# Patient Record
Sex: Female | Born: 1990
Health system: Southern US, Community
[De-identification: ages and names within clinical notes are randomized; demographics above are authoritative.]

## PROBLEM LIST (undated history)

## (undated) ENCOUNTER — Inpatient Hospital Stay (HOSPITAL_COMMUNITY): Payer: Self-pay

## (undated) DIAGNOSIS — D219 Benign neoplasm of connective and other soft tissue, unspecified: Secondary | ICD-10-CM

## (undated) DIAGNOSIS — T7840XA Allergy, unspecified, initial encounter: Secondary | ICD-10-CM

## (undated) DIAGNOSIS — R87629 Unspecified abnormal cytological findings in specimens from vagina: Secondary | ICD-10-CM

## (undated) DIAGNOSIS — I10 Essential (primary) hypertension: Secondary | ICD-10-CM

## (undated) DIAGNOSIS — Z789 Other specified health status: Secondary | ICD-10-CM

## (undated) HISTORY — DX: Unspecified abnormal cytological findings in specimens from vagina: R87.629

## (undated) HISTORY — DX: Other specified health status: Z78.9

## (undated) HISTORY — DX: Essential (primary) hypertension: I10

## (undated) HISTORY — PX: OTHER SURGICAL HISTORY: SHX169

## (undated) HISTORY — PX: NO PAST SURGERIES: SHX2092

## (undated) HISTORY — DX: Allergy, unspecified, initial encounter: T78.40XA

---
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
## 2009-11-13 ENCOUNTER — Emergency Department (HOSPITAL_COMMUNITY): Admission: EM | Admit: 2009-11-13 | Discharge: 2009-11-13 | Payer: Self-pay | Admitting: Emergency Medicine

## 2014-01-10 ENCOUNTER — Encounter: Payer: Self-pay | Admitting: *Deleted

## 2014-02-08 ENCOUNTER — Ambulatory Visit (INDEPENDENT_AMBULATORY_CARE_PROVIDER_SITE_OTHER): Payer: Managed Care, Other (non HMO) | Admitting: Family Medicine

## 2014-02-08 ENCOUNTER — Encounter: Payer: Self-pay | Admitting: Family Medicine

## 2014-02-08 VITALS — BP 124/62 | HR 78 | Temp 98.6°F | Resp 14 | Ht 62.0 in | Wt 151.0 lb

## 2014-02-08 DIAGNOSIS — Z124 Encounter for screening for malignant neoplasm of cervix: Secondary | ICD-10-CM

## 2014-02-08 DIAGNOSIS — Z0000 Encounter for general adult medical examination without abnormal findings: Secondary | ICD-10-CM

## 2014-02-08 DIAGNOSIS — Z720 Tobacco use: Secondary | ICD-10-CM

## 2014-02-08 DIAGNOSIS — Z3040 Encounter for surveillance of contraceptives, unspecified: Secondary | ICD-10-CM

## 2014-02-08 DIAGNOSIS — F17200 Nicotine dependence, unspecified, uncomplicated: Secondary | ICD-10-CM

## 2014-02-08 DIAGNOSIS — Z113 Encounter for screening for infections with a predominantly sexual mode of transmission: Secondary | ICD-10-CM

## 2014-02-08 DIAGNOSIS — Z23 Encounter for immunization: Secondary | ICD-10-CM

## 2014-02-08 LAB — CBC WITH DIFFERENTIAL/PLATELET
BASOS ABS: 0 10*3/uL (ref 0.0–0.1)
BASOS PCT: 0 % (ref 0–1)
Eosinophils Absolute: 0.1 10*3/uL (ref 0.0–0.7)
Eosinophils Relative: 1 % (ref 0–5)
HCT: 42.8 % (ref 36.0–46.0)
Hemoglobin: 13.9 g/dL (ref 12.0–15.0)
Lymphocytes Relative: 25 % (ref 12–46)
Lymphs Abs: 1.9 10*3/uL (ref 0.7–4.0)
MCH: 28.1 pg (ref 26.0–34.0)
MCHC: 32.5 g/dL (ref 30.0–36.0)
MCV: 86.6 fL (ref 78.0–100.0)
MPV: 10.2 fL (ref 8.6–12.4)
Monocytes Absolute: 0.8 10*3/uL (ref 0.1–1.0)
Monocytes Relative: 10 % (ref 3–12)
NEUTROS ABS: 4.9 10*3/uL (ref 1.7–7.7)
NEUTROS PCT: 64 % (ref 43–77)
PLATELETS: 334 10*3/uL (ref 150–400)
RBC: 4.94 MIL/uL (ref 3.87–5.11)
RDW: 13.9 % (ref 11.5–15.5)
WBC: 7.7 10*3/uL (ref 4.0–10.5)

## 2014-02-08 LAB — WET PREP FOR TRICH, YEAST, CLUE
CLUE CELLS WET PREP: NONE SEEN
Yeast Wet Prep HPF POC: NONE SEEN

## 2014-02-08 LAB — PREGNANCY, URINE: PREG TEST UR: NEGATIVE

## 2014-02-08 MED ORDER — INFLUENZA VAC SPLIT QUAD 0.5 ML IM SUSP: 0.5000 mL | Freq: Once | INTRAMUSCULAR | Status: DC

## 2014-02-08 MED ORDER — NORELGESTROMIN-ETH ESTRADIOL 150-35 MCG/24HR TD PTWK: 1.0000 | MEDICATED_PATCH | TRANSDERMAL | Status: DC

## 2014-02-08 NOTE — Assessment & Plan Note (Signed)
Discussed medications as well as smoking patches at this time she declines intervention

## 2014-02-08 NOTE — Addendum Note (Signed)
Addended by: Sheral Flow on: 02/08/2014 04:05 PM   Modules accepted: Orders

## 2014-02-08 NOTE — Progress Notes (Signed)
Patient ID: Allison Prince, female   DOB: 1990/10/01, 24 y.o.   MRN: [id number]   Subjective:    Patient ID: Allison Prince, female    DOB: 1990-09-02, 24 y.o.   MRN: [id number]  Patient presents for CPE with PAP  patient here to establish care. Her mother is also my patient Allison Prince. She has not had a primary care provider she went to the health department couple years ago states that she was treated for Trichomonas at that time. She is to check sexually active with  one partner. She does not have any children. She has no known medical problems. She does request STD screening today and should also like to start birth control. She's been on the pills in the past but wants to try topping different. She also smokes about a pack a day and is thinking about quitting  Works as Tax adviser:  GEN- denies fatigue, fever, weight loss,weakness, recent illness HEENT- denies eye drainage, change in vision, nasal discharge, CVS- denies chest pain, palpitations RESP- denies SOB, cough, wheeze ABD- denies N/V, change in stools, abd pain GU- denies dysuria, hematuria, dribbling, incontinence MSK- denies joint pain, muscle aches, injury Neuro- denies headache, dizziness, syncope, seizure activity       Objective:    BP 124/62 mmHg  Pulse 78  Temp(Src) 98.6 F (37 C) (Oral)  Resp 14  Ht 5\' 2"  (1.575 m)  Wt 151 lb (68.493 kg)  BMI 27.61 kg/m2  LMP 01/20/2014 (Exact Date) GEN- NAD, alert and oriented x3 HEENT- PERRL, EOMI, non injected sclera, pink conjunctiva, MMM, oropharynx clear, TM clear bilat, wears glasses Neck- Supple, no thyromegaly Breast- normal symmetry, no nipple inversion,no nipple drainage, no nodules or lumps felt Nodes- no axillary nodes CVS- RRR, no murmur RESP-CTAB ABD-NABS,soft,NT,ND GU- normal external genitalia, vaginal mucosa pink and moist, cervix visualized no growth, no blood form os, minimal thin clear discharge, no CMT, no ovarian masses,  uterus normal size EXT- No edema Pulses- Radial, DP- 2+        Assessment & Plan:      Problem List Items Addressed This Visit      Unprioritized   Tobacco use disorder    Other Visit Diagnoses    Cervical cancer screening    -  Primary    Relevant Orders    PAP, ThinPrep ASCUS Rflx HPV Rflx Type    Screen for STD (sexually transmitted disease)        Relevant Orders    WET PREP FOR [place], YEAST, CLUE (Completed)    GC/Chlamydia Probe Amp    HIV antibody    RPR    Routine general medical examination at a health care facility        CPE, PAP, flu shot, TDAP UTD, start ortho evra patch, U preg neg, fastingl labs due to family history    Relevant Orders    CBC with Differential    Comprehensive metabolic panel    Lipid panel    Encounter for surveillance of contraceptives        Relevant Orders    Pregnancy, urine (Completed)       Note: This dictation was prepared with Dragon dictation along with smaller phrase technology. Any transcriptional errors that result from this process are unintentional.

## 2014-02-08 NOTE — Patient Instructions (Signed)
I recommend eye visit once a year I recommend dental visit every 6 months Goal is to  Exercise 30 minutes 5 days a week We will send a letter with lab results  Flu shot given Start the birth control Patch - start first day of your menstrual cycle Call if you want to try something for smokng F/U as needed

## 2014-02-09 LAB — COMPREHENSIVE METABOLIC PANEL
ALBUMIN: 4.3 g/dL (ref 3.5–5.2)
ALT: 11 U/L (ref 0–35)
AST: 13 U/L (ref 0–37)
Alkaline Phosphatase: 36 U/L — ABNORMAL LOW (ref 39–117)
BUN: 9 mg/dL (ref 6–23)
CALCIUM: 9.3 mg/dL (ref 8.4–10.5)
CHLORIDE: 105 meq/L (ref 96–112)
CO2: 25 meq/L (ref 19–32)
CREATININE: 0.68 mg/dL (ref 0.50–1.10)
Glucose, Bld: 85 mg/dL (ref 70–99)
POTASSIUM: 4.1 meq/L (ref 3.5–5.3)
Sodium: 139 mEq/L (ref 135–145)
Total Bilirubin: 0.6 mg/dL (ref 0.2–1.2)
Total Protein: 7.1 g/dL (ref 6.0–8.3)

## 2014-02-09 LAB — GC/CHLAMYDIA PROBE AMP
CT Probe RNA: NEGATIVE
GC Probe RNA: NEGATIVE

## 2014-02-09 LAB — LIPID PANEL
CHOL/HDL RATIO: 3.1 ratio
Cholesterol: 151 mg/dL (ref 0–200)
HDL: 48 mg/dL (ref >39)
LDL Cholesterol: 87 mg/dL (ref 0–99)
Triglycerides: 82 mg/dL (ref <150)
VLDL: 16 mg/dL (ref 0–40)

## 2014-02-09 LAB — HIV ANTIBODY (ROUTINE TESTING W REFLEX): HIV: NONREACTIVE

## 2014-02-09 LAB — RPR

## 2014-02-10 ENCOUNTER — Other Ambulatory Visit: Payer: Self-pay | Admitting: *Deleted

## 2014-02-10 LAB — PAP THINPREP ASCUS RFLX HPV RFLX TYPE

## 2014-02-10 MED ORDER — METRONIDAZOLE 500 MG PO TABS: 500.0000 mg | ORAL_TABLET | Freq: Two times a day (BID) | ORAL | Status: DC

## 2014-08-16 ENCOUNTER — Encounter: Payer: Self-pay | Admitting: Family Medicine

## 2014-08-16 ENCOUNTER — Ambulatory Visit (INDEPENDENT_AMBULATORY_CARE_PROVIDER_SITE_OTHER): Payer: Managed Care, Other (non HMO) | Admitting: Family Medicine

## 2014-08-16 VITALS — BP 128/80 | HR 76 | Temp 98.6°F | Resp 18 | Ht 62.0 in | Wt 144.0 lb

## 2014-08-16 DIAGNOSIS — J029 Acute pharyngitis, unspecified: Secondary | ICD-10-CM | POA: Diagnosis not present

## 2014-08-16 LAB — RAPID STREP SCREEN (MED CTR MEBANE ONLY): STREPTOCOCCUS, GROUP A SCREEN (DIRECT): NEGATIVE

## 2014-08-16 MED ORDER — AMOXICILLIN 500 MG PO CAPS: 500.0000 mg | ORAL_CAPSULE | Freq: Three times a day (TID) | ORAL | Status: DC

## 2014-08-16 NOTE — Progress Notes (Signed)
Patient ID: Allison Prince, female   DOB: 1991/01/15, 24 y.o.   MRN: [id number]   Subjective:    Patient ID: Allison Prince, female    DOB: 1990-08-09, 24 y.o.   MRN: [id number]  Patient presents for Illness  HERE WITH SORE THROAT THAT STARTED 24 HOURS AGO. sHE FEELS LIKE IT IS A BURNING SCRATCHY SENSATION IN THE BACK OF HER THROAT AND VERY SEVERE WHEN SHE TRIES TO e. pOSITIVE SICK CONTACT WITH HER COUSIN WHO HAS STREP THROAT. SHE HAS NOT HAD ANY FEVER BUT SHE DID TAKE tYLENOL THIS MORNING. sHE'S NOT HAD ANY COUGH OR CONGESTION    Review Of Systems: PER ABOVE   GEN- denies fatigue, fever, weight loss,weakness, recent illness HEENT- denies eye drainage, change in vision, nasal discharge, CVS- denies chest pain, palpitations RESP- denies SOB, cough, wheeze ABD- denies N/V, change in stools, abd pain Neuro- denies headache, dizziness, syncope, seizure activity       Objective:    BP 128/80 mmHg  Pulse 76  Temp(Src) 98.6 F (37 C) (Oral)  Resp 18  Ht 5\' 2"  (1.575 m)  Wt 144 lb (65.318 kg)  BMI 26.33 kg/m2 GEN- NAD, alert and oriented x3 HEENT- PERRL, EOMI, non injected sclera, pink conjunctiva, MMM, oropharynx inected, blisters noted on uvula and right tonsil, enlarged tonsils  Neck- Supple, no + LAD R>L CVS- RRR, no murmur RESP-CTAB         Assessment & Plan:      Problem List Items Addressed This Visit    None    Visit Diagnoses    Acute pharyngitis, unspecified pharyngitis type    -  Primary    Early in illness, but meets criteria for strep, treat with amox, ibuprofen and salt water gargles    Relevant Orders    Rapid Strep Screen (Completed)       Note: This dictation was prepared with Dragon dictation along with smaller phrase technology. Any transcriptional errors that result from this process are unintentional.

## 2014-08-16 NOTE — Patient Instructions (Signed)
Take antibiotics as prescribed Use salt water gargling Ibuprofen as needed  F/U as needed

## 2015-02-17 ENCOUNTER — Other Ambulatory Visit: Payer: Self-pay | Admitting: Family Medicine

## 2015-02-19 NOTE — Telephone Encounter (Signed)
Refill appropriate and filled per protocol. 

## 2015-04-20 ENCOUNTER — Telehealth: Payer: Self-pay | Admitting: Family Medicine

## 2015-04-20 MED ORDER — NORELGESTROMIN-ETH ESTRADIOL 150-35 MCG/24HR TD PTWK: MEDICATED_PATCH | TRANSDERMAL | Status: DC

## 2015-04-20 NOTE — Telephone Encounter (Signed)
Patient aware.

## 2015-04-20 NOTE — Telephone Encounter (Signed)
Patient calling requesting refill called into [facility] in [place] she also would like to know when she is do for another pap- she is about to change jobs and would like to have this done if she needs it.    XULANE 150-35 MCG/24HR transdermal patch  CB# [phone]

## 2015-04-20 NOTE — Telephone Encounter (Signed)
Patient las PAP noted in 02/27/2015. MD recommends PAP to be repeated in 2 years.   Prescription sent to pharmacy.   Call placed to patient. [place].

## 2015-08-01 ENCOUNTER — Telehealth: Payer: Self-pay | Admitting: Family Medicine

## 2015-08-01 NOTE — Telephone Encounter (Signed)
Patient calling to get her birth control prescription changed, quantity and pharmacy  [phone]

## 2015-08-01 NOTE — Telephone Encounter (Signed)
Call placed to patient. LMTRC.  

## 2015-08-02 NOTE — Telephone Encounter (Signed)
Call placed to patient. LMTRC.  

## 2015-08-03 NOTE — Telephone Encounter (Signed)
Multiple calls placed to patient with no answer and no return call.   Message to be closed.  

## 2015-10-24 ENCOUNTER — Other Ambulatory Visit: Payer: Self-pay | Admitting: *Deleted

## 2015-10-24 MED ORDER — NORELGESTROMIN-ETH ESTRADIOL 150-35 MCG/24HR TD PTWK: MEDICATED_PATCH | TRANSDERMAL | 6 refills | Status: DC

## 2015-10-24 NOTE — Telephone Encounter (Signed)
Received fax requesting refill on Xulane.   Refill appropriate and filled per protocol.

## 2016-04-18 ENCOUNTER — Emergency Department (HOSPITAL_COMMUNITY)
Admission: EM | Admit: 2016-04-18 | Discharge: 2016-04-18 | Disposition: A | Discharge status: Home / Self Care | Payer: Managed Care, Other (non HMO) | Attending: Emergency Medicine | Admitting: Emergency Medicine

## 2016-04-18 ENCOUNTER — Encounter (HOSPITAL_COMMUNITY): Payer: Self-pay | Admitting: Emergency Medicine

## 2016-04-18 DIAGNOSIS — F17210 Nicotine dependence, cigarettes, uncomplicated: Secondary | ICD-10-CM | POA: Diagnosis not present

## 2016-04-18 DIAGNOSIS — N390 Urinary tract infection, site not specified: Secondary | ICD-10-CM | POA: Diagnosis not present

## 2016-04-18 DIAGNOSIS — R112 Nausea with vomiting, unspecified: Secondary | ICD-10-CM

## 2016-04-18 DIAGNOSIS — B349 Viral infection, unspecified: Secondary | ICD-10-CM

## 2016-04-18 DIAGNOSIS — R197 Diarrhea, unspecified: Secondary | ICD-10-CM

## 2016-04-18 LAB — COMPREHENSIVE METABOLIC PANEL
ALT: 13 U/L — ABNORMAL LOW (ref 14–54)
ANION GAP: 10 (ref 5–15)
AST: 15 U/L (ref 15–41)
Albumin: 3.8 g/dL (ref 3.5–5.0)
Alkaline Phosphatase: 23 U/L — ABNORMAL LOW (ref 38–126)
BUN: 9 mg/dL (ref 6–20)
CHLORIDE: 102 mmol/L (ref 101–111)
CO2: 25 mmol/L (ref 22–32)
CREATININE: 0.79 mg/dL (ref 0.44–1.00)
Calcium: 9.3 mg/dL (ref 8.9–10.3)
Glucose, Bld: 96 mg/dL (ref 65–99)
POTASSIUM: 3.8 mmol/L (ref 3.5–5.1)
Sodium: 137 mmol/L (ref 135–145)
Total Bilirubin: 0.1 mg/dL — ABNORMAL LOW (ref 0.3–1.2)
Total Protein: 7.9 g/dL (ref 6.5–8.1)

## 2016-04-18 LAB — URINALYSIS, ROUTINE W REFLEX MICROSCOPIC
BILIRUBIN URINE: NEGATIVE
Glucose, UA: NEGATIVE mg/dL
KETONES UR: 5 mg/dL — AB
Leukocytes, UA: NEGATIVE
Nitrite: POSITIVE — AB
PH: 5 (ref 5.0–8.0)
Protein, ur: 100 mg/dL — AB
Specific Gravity, Urine: 1.024 (ref 1.005–1.030)

## 2016-04-18 LAB — CBC
HEMATOCRIT: 39.5 % (ref 36.0–46.0)
HEMOGLOBIN: 12.9 g/dL (ref 12.0–15.0)
MCH: 27 pg (ref 26.0–34.0)
MCHC: 32.7 g/dL (ref 30.0–36.0)
MCV: 82.6 fL (ref 78.0–100.0)
PLATELETS: 328 10*3/uL (ref 150–400)
RBC: 4.78 MIL/uL (ref 3.87–5.11)
RDW: 14.5 % (ref 11.5–15.5)
WBC: 6.6 10*3/uL (ref 4.0–10.5)

## 2016-04-18 LAB — LIPASE, BLOOD: LIPASE: 17 U/L (ref 11–51)

## 2016-04-18 MED ORDER — ONDANSETRON 4 MG PO TBDP: 4.0000 mg | ORAL_TABLET | Freq: Three times a day (TID) | ORAL | 0 refills | Status: DC | PRN

## 2016-04-18 MED ORDER — ACETAMINOPHEN 325 MG PO TABS
650.0000 mg | ORAL_TABLET | Freq: Once | ORAL | Status: AC
  Administered 2016-04-18: 650 mg via ORAL
  Filled 2016-04-18: qty 2

## 2016-04-18 MED ORDER — CEPHALEXIN 500 MG PO CAPS: 500.0000 mg | ORAL_CAPSULE | Freq: Four times a day (QID) | ORAL | 0 refills | Status: DC

## 2016-04-18 MED ORDER — ONDANSETRON 8 MG PO TBDP
8.0000 mg | ORAL_TABLET | Freq: Once | ORAL | Status: AC
  Administered 2016-04-18: 8 mg via ORAL
  Filled 2016-04-18: qty 1

## 2016-04-18 NOTE — ED Provider Notes (Signed)
[facility] DEPT Provider Note   CSN: [id number] Arrival date & time: 04/18/16  1457     History   Chief Complaint Chief Complaint  Patient presents with  . Nausea    HPI Allison Prince is a 26 y.o. female.  HPI  Pt was seen at 1750. Per pt, c/o gradual onset and persistence of multiple intermittent episodes of N/V/D that began 3 days ago.   Describes the stools as "watery." Has been associated with frontal headache, sinus congestion, runny/stuffy nose, generalized body aches and chills. Denies abd pain, no CP/SOB, no back pain, no objective fevers, no black or blood in stools or emesis.    Past Medical History:  Diagnosis Date  . Allergy     Patient Active Problem List   Diagnosis Date Noted  . Tobacco use disorder 02/08/2014    History reviewed. No pertinent surgical history.  OB History    No data available       Home Medications    Prior to Admission medications   Medication Sig Start Date End Date Taking? Authorizing Provider  acetaminophen (TYLENOL) 500 MG tablet Take 500 mg by mouth every 6 (six) hours as needed.    Historical Provider, MD  amoxicillin (AMOXIL) 500 MG capsule Take 1 capsule (500 mg total) by mouth 3 (three) times daily. 08/16/14   Alycia Rossetti, MD  norelgestromin-ethinyl estradiol Marilu Favre) 150-35 MCG/24HR transdermal patch PLACE 1 PATCH ONTO THE SKIN ONCE A WEEK 10/24/15   Alycia Rossetti, MD    Family History Family History  Problem Relation Age of Onset  . COPD Mother   . Depression Mother   . Diabetes Mother   . Hyperlipidemia Mother   . Hypertension Mother   . Asthma Brother     Social History Social History  Substance Use Topics  . Smoking status: Current Every Day Smoker    Packs/day: 0.50    Types: Cigarettes  . Smokeless tobacco: Never Used  . Alcohol use 1.2 oz/week    2 Shots of liquor per week     Allergies   Asa [aspirin] and Latex   Review of Systems Review of Systems ROS: Statement: All  systems negative except as marked or noted in the HPI; Constitutional: Negative for objective fever and +chills. ; ; Eyes: Negative for eye pain, redness and discharge. ; ; ENMT: Negative for ear pain, hoarseness, sore throat. +frontal headache, nasal congestion, sinus pressure and rhinorrhea. ; ; Cardiovascular: Negative for chest pain, palpitations, diaphoresis, dyspnea and peripheral edema. ; ; Respiratory: Negative for cough, wheezing and stridor. ; ; Gastrointestinal: +N/V/D. Negative for abdominal pain, blood in stool, hematemesis, jaundice and rectal bleeding. . ; ; Genitourinary: Negative for dysuria, flank pain and hematuria. ; ; Musculoskeletal: Negative for back pain and neck pain. Negative for swelling and trauma.; ; Skin: Negative for pruritus, rash, abrasions, blisters, bruising and skin lesion.; ; Neuro: Negative for lightheadedness and neck stiffness. Negative for weakness, altered level of consciousness, altered mental status, extremity weakness, paresthesias, involuntary movement, seizure and syncope.       Physical Exam Updated Vital Signs BP (!) 145/108 (BP Location: Right Arm)   Pulse 90   Temp 98.8 F (37.1 C) (Oral)   Resp 16   Ht 5\' 2"  (1.575 m)   Wt 147 lb (66.7 kg)   LMP 04/04/2016   SpO2 100%   BMI 26.89 kg/m   Physical Exam 1755: Physical examination:  Nursing notes reviewed; Vital signs and O2 SAT reviewed;  Constitutional: Well developed, Well nourished, Well hydrated, In no acute distress; Head:  Normocephalic, atraumatic; Eyes: EOMI, PERRL, No scleral icterus; ENMT: TM's clear bilat. +edemetous nasal turbinates bilat with clear rhinorrhea. Mouth and pharynx without lesions. No tonsillar exudates. No intra-oral edema. No submandibular or sublingual edema. No hoarse voice, no drooling, no stridor. No pain with manipulation of larynx. No trismus.  Mouth and pharynx normal, Mucous membranes moist; Neck: Supple, Full range of motion, No lymphadenopathy; Cardiovascular:  Regular rate and rhythm, No gallop; Respiratory: Breath sounds clear & equal bilaterally, No wheezes.  Speaking full sentences with ease, Normal respiratory effort/excursion; Chest: Nontender, Movement normal; Abdomen: Soft, Nontender, Nondistended, Normal bowel sounds; Genitourinary: No CVA tenderness; Extremities: Pulses normal, No tenderness, No edema, No calf edema or asymmetry.; Neuro: AA&Ox3, Major CN grossly intact.  Speech clear. No gross focal motor or sensory deficits in extremities.; Skin: Color normal, Warm, Dry.   ED Treatments / Results  Labs (all labs ordered are listed, but only abnormal results are displayed)   EKG  EKG Interpretation None       Radiology    Procedures Procedures (including critical care time)  Medications Ordered in ED Medications  ondansetron (ZOFRAN-ODT) disintegrating tablet 8 mg (8 mg Oral Given 04/18/16 1812)  acetaminophen (TYLENOL) tablet 650 mg (650 mg Oral Given 04/18/16 1812)     Initial Impression / Assessment and Plan / ED Course  I have reviewed the triage vital signs and the nursing notes.  Pertinent labs & imaging results that were available during my care of the patient were reviewed by me and considered in my medical decision making (see chart for details).  MDM Reviewed: previous chart, nursing note and vitals Reviewed previous: labs Interpretation: labs   Results for orders placed or performed during the hospital encounter of 04/18/16  Lipase, blood  Result Value Ref Range   Lipase 17 11 - 51 U/L  Comprehensive metabolic panel  Result Value Ref Range   Sodium 137 135 - 145 mmol/L   Potassium 3.8 3.5 - 5.1 mmol/L   Chloride 102 101 - 111 mmol/L   CO2 25 22 - 32 mmol/L   Glucose, Bld 96 65 - 99 mg/dL   BUN 9 6 - 20 mg/dL   Creatinine, Ser 0.79 0.44 - 1.00 mg/dL   Calcium 9.3 8.9 - 10.3 mg/dL   Total Protein 7.9 6.5 - 8.1 g/dL   Albumin 3.8 3.5 - 5.0 g/dL   AST 15 15 - 41 U/L   ALT 13 (L) 14 - 54 U/L   Alkaline  Phosphatase 23 (L) 38 - 126 U/L   Total Bilirubin 0.1 (L) 0.3 - 1.2 mg/dL   GFR calc non Af Amer >60 >60 mL/min   GFR calc Af Amer >60 >60 mL/min   Anion gap 10 5 - 15  CBC  Result Value Ref Range   WBC 6.6 4.0 - 10.5 K/uL   RBC 4.78 3.87 - 5.11 MIL/uL   Hemoglobin 12.9 12.0 - 15.0 g/dL   HCT 39.5 36.0 - 46.0 %   MCV 82.6 78.0 - 100.0 fL   MCH 27.0 26.0 - 34.0 pg   MCHC 32.7 30.0 - 36.0 g/dL   RDW 14.5 11.5 - 15.5 %   Platelets 328 150 - 400 K/uL  Urinalysis, Routine w reflex microscopic  Result Value Ref Range   Color, Urine AMBER (A) YELLOW   APPearance HAZY (A) CLEAR   Specific Gravity, Urine 1.024 1.005 - 1.030   pH 5.0  5.0 - 8.0   Glucose, UA NEGATIVE NEGATIVE mg/dL   Hgb urine dipstick LARGE (A) NEGATIVE   Bilirubin Urine NEGATIVE NEGATIVE   Ketones, ur 5 (A) NEGATIVE mg/dL   Protein, ur 100 (A) NEGATIVE mg/dL   Nitrite POSITIVE (A) NEGATIVE   Leukocytes, UA NEGATIVE NEGATIVE   RBC / HPF 6-30 0 - 5 RBC/hpf   WBC, UA 6-30 0 - 5 WBC/hpf   Bacteria, UA MANY (A) NONE SEEN   Squamous Epithelial / LPF 0-5 (A) NONE SEEN   Mucous PRESENT     1925:  Lab verbally reported pregnancy test as negative to ED Charge RN and myself. Pt has tol PO well while in the ED without N/V.  No stooling while in the ED.  Abd remains benign, VSS. Feels better and wants to go home now. Will tx for UTI and symptomatically for N/V. Dx and testing d/w pt and family.  Questions answered.  Verb understanding, agreeable to d/c home with outpt f/u.     Final Clinical Impressions(s) / ED Diagnoses   Final diagnoses:  None    New Prescriptions New Prescriptions   No medications on file     Francine Graven, DO 04/21/16 0626

## 2016-04-18 NOTE — ED Notes (Signed)
Pt is drinking Gatorade and tolerating well.

## 2016-04-18 NOTE — ED Triage Notes (Signed)
Nausea/vomiting on and off since Tuesday.  Loss of appetite

## 2016-04-18 NOTE — Discharge Instructions (Signed)
Take the prescriptions as directed.  Increase your fluid intake (ie:  Gatoraide) for the next few days.  Eat a bland diet and advance to your regular diet slowly as you can tolerate it.   Avoid full strength juices, as well as milk and milk products until your diarrhea has resolved. Take over the counter tylenol and ibuprofen, as directed on packaging, as needed for discomfort.  Call your regular medical doctor Monday to schedule a follow up appointment this week.  Return to the Emergency Department immediately sooner if worsening.

## 2016-05-05 ENCOUNTER — Other Ambulatory Visit: Payer: Self-pay | Admitting: *Deleted

## 2016-05-05 MED ORDER — NORELGESTROMIN-ETH ESTRADIOL 150-35 MCG/24HR TD PTWK: MEDICATED_PATCH | TRANSDERMAL | 0 refills | Status: DC

## 2016-06-11 ENCOUNTER — Ambulatory Visit (INDEPENDENT_AMBULATORY_CARE_PROVIDER_SITE_OTHER): Payer: Managed Care, Other (non HMO) | Admitting: Family Medicine

## 2016-06-11 ENCOUNTER — Encounter: Payer: Self-pay | Admitting: Family Medicine

## 2016-06-11 ENCOUNTER — Other Ambulatory Visit: Payer: Self-pay | Admitting: Obstetrics and Gynecology

## 2016-06-11 ENCOUNTER — Ambulatory Visit (INDEPENDENT_AMBULATORY_CARE_PROVIDER_SITE_OTHER): Payer: Managed Care, Other (non HMO)

## 2016-06-11 VITALS — BP 118/64 | HR 100 | Temp 98.4°F | Resp 14 | Ht 62.0 in | Wt 146.0 lb

## 2016-06-11 DIAGNOSIS — O3680X0 Pregnancy with inconclusive fetal viability, not applicable or unspecified: Secondary | ICD-10-CM

## 2016-06-11 DIAGNOSIS — Z3201 Encounter for pregnancy test, result positive: Secondary | ICD-10-CM

## 2016-06-11 DIAGNOSIS — Z3200 Encounter for pregnancy test, result unknown: Secondary | ICD-10-CM | POA: Diagnosis not present

## 2016-06-11 LAB — PREGNANCY, URINE: Preg Test, Ur: POSITIVE — AB

## 2016-06-11 NOTE — Progress Notes (Signed)
[place] 6+1 wks,single IUP w/ys,pos fht 122 bpm,normal ovaries bilat,subchorionic hemorrhage 1.6 x .7 x 2.3 cm,mult fibroids (#1) post fundal right subserosal fibroid 4.3 x 3.8 x 4.6 cm,(#2) pedunculated fundal left fibroid 2.7 x 2.3 x 2.2 cm,EDD 02/03/2017

## 2016-06-11 NOTE — Progress Notes (Signed)
   Subjective:    Patient ID: Allison Prince, female    DOB: [birth year], 26 y.o.   MRN: [id number]  Patient presents for Pregnancy Congfirmation (home test positive x3- LMP 03/30/16)   Patient here to confirm pregnancy. She thinks her last initial period was around to getting of March. She took a pregnancy test for the first time on Friday which was positive she took 2 others which are also positive. She's here today for confirmation referral to OB/GYN. She did start taking prenatal vitamins. Has not had any nausea vomiting she did have some constipation and took a Dulcolax but otherwise has felt fine. She has not taken any other medications. She denies any spotting or vaginal discharge   Review Of Systems:  GEN- denies fatigue, fever, weight loss,weakness, recent illness HEENT- denies eye drainage, change in vision, nasal discharge, CVS- denies chest pain, palpitations RESP- denies SOB, cough, wheeze ABD- denies N/V, change in stools, abd pain GU- denies dysuria, hematuria, dribbling, incontinence MSK- denies joint pain, muscle aches, injury Neuro- denies headache, dizziness, syncope, seizure activity       Objective:    BP 118/64   Pulse 100   Temp 98.4 F (36.9 C) (Oral)   Resp 14   Ht 5\' 2"  (1.575 m)   Wt 146 lb (66.2 kg)   LMP 03/30/2016 (Approximate) Comment: regular  SpO2 98%   BMI 26.70 kg/m  GEN- NAD, alert and oriented x3 HEENT- PERRL, EOMI, non injected sclera, pink conjunctiva, MMM, oropharynx clear Neck- Supple, no thyromegaly CVS- RRR, no murmur RESP-CTAB ABD-NABS,soft,NT,ND EXT- No edema Pulses- Radial, 2+        Assessment & Plan:      Problem List Items Addressed This Visit    None    Visit Diagnoses    Encounter for confirmation of pregnancy test result with physical examination    -  Primary   Positive test. Continue prenatal vitamins, referral to OB/GYN she is around [redacted] weeks pregnant, EDD Dec 9/10th   Relevant Orders   Pregnancy, urine  (Completed)   Positive pregnancy test       Relevant Orders   Ambulatory referral to Obstetrics / Gynecology      Note: This dictation was prepared with Dragon dictation along with smaller phrase technology. Any transcriptional errors that result from this process are unintentional.

## 2016-06-11 NOTE — Patient Instructions (Addendum)
Continue prenatal test  Referral to OB/GYN  Take tylenol only for headache

## 2016-06-18 ENCOUNTER — Encounter: Payer: Managed Care, Other (non HMO) | Admitting: Adult Health

## 2016-06-25 ENCOUNTER — Ambulatory Visit: Payer: Managed Care, Other (non HMO) | Admitting: *Deleted

## 2016-06-25 ENCOUNTER — Encounter: Payer: Self-pay | Admitting: Women's Health

## 2016-06-25 ENCOUNTER — Ambulatory Visit (INDEPENDENT_AMBULATORY_CARE_PROVIDER_SITE_OTHER): Payer: Managed Care, Other (non HMO) | Admitting: Women's Health

## 2016-06-25 VITALS — BP 124/84 | HR 84 | Wt 147.5 lb

## 2016-06-25 DIAGNOSIS — Z3401 Encounter for supervision of normal first pregnancy, first trimester: Secondary | ICD-10-CM

## 2016-06-25 DIAGNOSIS — O3410 Maternal care for benign tumor of corpus uteri, unspecified trimester: Secondary | ICD-10-CM

## 2016-06-25 DIAGNOSIS — Z331 Pregnant state, incidental: Secondary | ICD-10-CM

## 2016-06-25 DIAGNOSIS — F17200 Nicotine dependence, unspecified, uncomplicated: Secondary | ICD-10-CM | POA: Insufficient documentation

## 2016-06-25 DIAGNOSIS — Z3682 Encounter for antenatal screening for nuchal translucency: Secondary | ICD-10-CM

## 2016-06-25 DIAGNOSIS — Z720 Tobacco use: Secondary | ICD-10-CM | POA: Insufficient documentation

## 2016-06-25 DIAGNOSIS — Z3400 Encounter for supervision of normal first pregnancy, unspecified trimester: Secondary | ICD-10-CM | POA: Insufficient documentation

## 2016-06-25 DIAGNOSIS — D259 Leiomyoma of uterus, unspecified: Secondary | ICD-10-CM

## 2016-06-25 DIAGNOSIS — Z1389 Encounter for screening for other disorder: Secondary | ICD-10-CM

## 2016-06-25 DIAGNOSIS — Z3A08 8 weeks gestation of pregnancy: Secondary | ICD-10-CM

## 2016-06-25 LAB — POCT URINALYSIS DIPSTICK
Glucose, UA: NEGATIVE
KETONES UA: NEGATIVE
Leukocytes, UA: NEGATIVE
Nitrite, UA: NEGATIVE
PROTEIN UA: NEGATIVE

## 2016-06-25 NOTE — Patient Instructions (Signed)

## 2016-06-25 NOTE — Progress Notes (Addendum)
  Subjective:  Allison Prince is a 26 y.o. G66P0 African American female at [redacted]w[redacted]d by 6wk u/s, being seen today for her first obstetrical visit.  Her obstetrical history is significant for primigravida, smoker: 1/2ppd prior to pregnancy, now 5-6cigarettes/day and thinks she can quit on her own. Multipe uterine fibroids and small [place] noted at 6wk u/s.  Driving to [facility] then flying to [facility] in few weeks, wants to k now if she can ride jet skis-advised against d/t force of coming back down on water when hitting wakes. Pregnancy history fully reviewed.  Patient reports no complaints. Denies vb, cramping, uti s/s, abnormal/malodorous vag d/c, or vulvovaginal itching/irritation.  BP 124/84   Pulse 84   Wt 147 lb 8 oz (66.9 kg)   LMP 03/30/2016 (Approximate)   BMI 26.98 kg/m   HISTORY: OB History  Gravida Para Term Preterm AB Living  1            SAB TAB Ectopic Multiple Live Births               # Outcome Date GA Lbr Len/2nd Weight Sex Delivery Anes PTL Lv  1 Current              Past Medical History:  Diagnosis Date  . Allergy   . Medical history non-contributory    Past Surgical History:  Procedure Laterality Date  . NO PAST SURGERIES     Family History  Problem Relation Age of Onset  . COPD Mother   . Depression Mother   . Diabetes Mother   . Hyperlipidemia Mother   . Hypertension Mother   . Asthma Brother   . Arthritis Paternal Grandfather     Exam   System:     General: Well developed & nourished, no acute distress   Skin: Warm & dry, normal coloration and turgor, no rashes   Neurologic: Alert & oriented, normal mood   Cardiovascular: Regular rate & rhythm   Respiratory: Effort & rate normal, LCTAB, acyanotic   Abdomen: Soft, non tender   Extremities: normal strength, tone  Thin prep pap smear neg 02/08/14 FHR: + via informal transabdominal u/s   Assessment:   Pregnancy: G1P0 Patient Active Problem List   Diagnosis Date Noted  . Supervision of normal  first pregnancy 06/25/2016  . Smoker 06/25/2016  . Tobacco use disorder 02/08/2014    [redacted]w[redacted]d G1P0 New OB visit Smoker Multiple uterine fibroids [facility]  Plan:  Initial labs obtained Continue prenatal vitamins Problem list reviewed and updated Reviewed n/v relief measures and warning s/s to report Reviewed recommended weight gain based on pre-gravid BMI Encouraged well-balanced diet Genetic Screening discussed Integrated Screen: requested Cystic fibrosis screening discussed declined Ultrasound discussed; fetal survey: requested Follow up in 4 weeks for 1st IT/NT and visit [place] completed Smokes 5-6 cigarettes/day, advised cessation, discussed risks to fetus while pregnant, to infant pp, and to herself. Offered QuitlineNC, declined, wants to try quitting on her own Let us know if needs note to fly Discussed she is at a high risk for DVT/PE when pregnancy and traveling increases that risk. Recommended walking around q 1hr, don't cross legs, and dorsiflex feet often to help decrease r/f DVT/PE.    Tawnya Crook CNM, [facility] 06/25/2016 3:09 PM

## 2016-06-26 LAB — URINALYSIS, ROUTINE W REFLEX MICROSCOPIC
Bilirubin, UA: NEGATIVE
GLUCOSE, UA: NEGATIVE
Leukocytes, UA: NEGATIVE
Nitrite, UA: NEGATIVE
RBC, UA: NEGATIVE
SPEC GRAV UA: 1.027 (ref 1.005–1.030)
Urobilinogen, Ur: 0.2 mg/dL (ref 0.2–1.0)
pH, UA: 5.5 (ref 5.0–7.5)

## 2016-06-26 LAB — CBC
Hematocrit: 34.2 % (ref 34.0–46.6)
Hemoglobin: 11.3 g/dL (ref 11.1–15.9)
MCH: 27.2 pg (ref 26.6–33.0)
MCHC: 33 g/dL (ref 31.5–35.7)
MCV: 82 fL (ref 79–97)
PLATELETS: 396 10*3/uL — AB (ref 150–379)
RBC: 4.16 x10E6/uL (ref 3.77–5.28)
RDW: 15.1 % (ref 12.3–15.4)
WBC: 9.2 10*3/uL (ref 3.4–10.8)

## 2016-06-26 LAB — RUBELLA SCREEN: Rubella Antibodies, IGG: 8.35 index (ref >0.99)

## 2016-06-26 LAB — ANTIBODY SCREEN: Antibody Screen: NEGATIVE

## 2016-06-26 LAB — ABO/RH: RH TYPE: POSITIVE

## 2016-06-26 LAB — PMP SCREEN PROFILE (10S), URINE
AMPHETAMINE SCREEN URINE: NEGATIVE ng/mL
BARBITURATE SCREEN URINE: NEGATIVE ng/mL
BENZODIAZEPINE SCREEN, URINE: NEGATIVE ng/mL
CANNABINOIDS UR QL SCN: NEGATIVE ng/mL
Cocaine (Metab) Scrn, Ur: NEGATIVE ng/mL
Creatinine(Crt), U: 277.2 mg/dL (ref 20.0–300.0)
Methadone Screen, Urine: NEGATIVE ng/mL
OXYCODONE+OXYMORPHONE UR QL SCN: NEGATIVE ng/mL
Opiate Scrn, Ur: NEGATIVE ng/mL
PH UR, DRUG SCRN: 5.6 (ref 4.5–8.9)
PHENCYCLIDINE QUANTITATIVE URINE: NEGATIVE ng/mL
Propoxyphene Scrn, Ur: NEGATIVE ng/mL

## 2016-06-26 LAB — HIV ANTIBODY (ROUTINE TESTING W REFLEX): HIV Screen 4th Generation wRfx: NONREACTIVE

## 2016-06-26 LAB — HEPATITIS B SURFACE ANTIGEN: Hepatitis B Surface Ag: NEGATIVE

## 2016-06-26 LAB — MED LIST OPTION NOT SELECTED

## 2016-06-26 LAB — VARICELLA ZOSTER ANTIBODY, IGG: VARICELLA: 1787 {index} (ref >165)

## 2016-06-26 LAB — RPR: RPR: NONREACTIVE

## 2016-06-27 LAB — URINE CULTURE: ORGANISM ID, BACTERIA: NO GROWTH

## 2016-06-27 LAB — GC/CHLAMYDIA PROBE AMP
Chlamydia trachomatis, NAA: NEGATIVE
Neisseria gonorrhoeae by PCR: NEGATIVE

## 2016-07-28 ENCOUNTER — Ambulatory Visit (INDEPENDENT_AMBULATORY_CARE_PROVIDER_SITE_OTHER): Payer: Managed Care, Other (non HMO) | Admitting: Obstetrics and Gynecology

## 2016-07-28 ENCOUNTER — Encounter: Payer: Self-pay | Admitting: Obstetrics and Gynecology

## 2016-07-28 ENCOUNTER — Ambulatory Visit (INDEPENDENT_AMBULATORY_CARE_PROVIDER_SITE_OTHER): Payer: Managed Care, Other (non HMO)

## 2016-07-28 VITALS — BP 114/70 | HR 84 | Wt 140.8 lb

## 2016-07-28 DIAGNOSIS — Z3401 Encounter for supervision of normal first pregnancy, first trimester: Secondary | ICD-10-CM

## 2016-07-28 DIAGNOSIS — Z3682 Encounter for antenatal screening for nuchal translucency: Secondary | ICD-10-CM | POA: Diagnosis not present

## 2016-07-28 DIAGNOSIS — Z331 Pregnant state, incidental: Secondary | ICD-10-CM

## 2016-07-28 DIAGNOSIS — Z1389 Encounter for screening for other disorder: Secondary | ICD-10-CM

## 2016-07-28 DIAGNOSIS — Z1379 Encounter for other screening for genetic and chromosomal anomalies: Secondary | ICD-10-CM

## 2016-07-28 LAB — POCT URINALYSIS DIPSTICK
Glucose, UA: NEGATIVE
KETONES UA: NEGATIVE
Leukocytes, UA: NEGATIVE
Nitrite, UA: NEGATIVE
Protein, UA: NEGATIVE
RBC UA: NEGATIVE

## 2016-07-28 NOTE — Progress Notes (Signed)
[place] 12+6 wks,measurements c/w dates,normal ovaries bilat.,crl 63.2 mm,fhr 155 bpm,NB presents,NT 1.2 mm,mult.fibroids N/C (#1) post fundal 4.2 x 4.4 x 4.7 cm,(#2) fundal 2.8 x 3.1 x 1.8 cm

## 2016-07-28 NOTE — Progress Notes (Signed)
Allison Prince is a 26 y.o. female  G1P0  Estimated Date of Delivery: 02/03/17 LROB [redacted]w[redacted]d  Chief Complaint  Patient presents with  . Routine Prenatal Visit    nt/it  ____  Patient complaints:no new complaints Patient denies any bleeding , rupture of membranes,or regular contractions.  Blood pressure 114/70, pulse 84, weight 140 lb 12.8 oz (63.9 kg), last menstrual period 03/30/2016.   Urine results: negative  refer to the ob flow sheet for FH and FHR, ,                          Physical Examination: General appearance - alert, well appearing, and in no distress, oriented to person, place, and time and normal appearing weight                                      Abdomen -FHR 156 bpm                                                                                               Pelvic - not indicated                                            Questions were answered. Assessment: LROB G1P0 @ [redacted]w[redacted]d  Plan:  Continued routine obstetrical care  F/u in 4 weeks for LROB routine visit.   By signing my name below, I, Evelene Croon, attest that this documentation has been prepared under the direction and in the presence of Jonnie Kind, MD . Electronically Signed: Evelene Croon, Scribe. 07/28/2016. 2:16 PM. I personally performed the services described in this documentation, which was SCRIBED in my presence. The recorded information has been reviewed and considered accurate. It has been edited as necessary during review. Jonnie Kind, MD  Seen with 2nd yr resident.

## 2016-08-02 LAB — MATERNAL SCREEN, INTEGRATED #1
Crown Rump Length: 63.2 mm
Gest. Age on Collection Date: 12.6 weeks
Maternal Age at EDD: 28 yr
NUCHAL TRANSLUCENCY (NT): 1.2 mm
Number of Fetuses: 1
PAPP-A VALUE: 1319.2 ng/mL
Weight: 141 [lb_av]

## 2016-08-25 ENCOUNTER — Encounter: Payer: Self-pay | Admitting: Women's Health

## 2016-08-25 ENCOUNTER — Ambulatory Visit (INDEPENDENT_AMBULATORY_CARE_PROVIDER_SITE_OTHER): Payer: Managed Care, Other (non HMO) | Admitting: Women's Health

## 2016-08-25 VITALS — BP 104/72 | HR 74 | Wt 139.5 lb

## 2016-08-25 DIAGNOSIS — Z3A16 16 weeks gestation of pregnancy: Secondary | ICD-10-CM

## 2016-08-25 DIAGNOSIS — Z1379 Encounter for other screening for genetic and chromosomal anomalies: Secondary | ICD-10-CM

## 2016-08-25 DIAGNOSIS — Z331 Pregnant state, incidental: Secondary | ICD-10-CM

## 2016-08-25 DIAGNOSIS — Z3402 Encounter for supervision of normal first pregnancy, second trimester: Secondary | ICD-10-CM

## 2016-08-25 DIAGNOSIS — Z363 Encounter for antenatal screening for malformations: Secondary | ICD-10-CM

## 2016-08-25 DIAGNOSIS — Z1389 Encounter for screening for other disorder: Secondary | ICD-10-CM

## 2016-08-25 LAB — POCT URINALYSIS DIPSTICK
GLUCOSE UA: NEGATIVE
LEUKOCYTES UA: NEGATIVE
NITRITE UA: NEGATIVE
Protein, UA: NEGATIVE

## 2016-08-25 NOTE — Patient Instructions (Signed)

## 2016-08-25 NOTE — Progress Notes (Signed)
Low-risk OB appointment G1P0 [redacted]w[redacted]d Estimated Date of Delivery: 02/03/17 BP 104/72   Pulse 74   Wt 139 lb 8 oz (63.3 kg)   LMP 03/30/2016 (Approximate)   BMI 25.51 kg/m   BP, weight, and urine reviewed.  Refer to obstetrical flow sheet for FH & FHR.  No fm yet. Denies cramping, lof, vb, or uti s/s. No complaints. Reviewed warning s/s to report, fm. Plan:  Continue routine obstetrical care  F/U in 2wks for OB appointment and anatomy u/s 2nd IT today NFPartnership offered, accepted, referral faxed

## 2016-08-29 LAB — MATERNAL SCREEN, INTEGRATED #2
AFP MOM: 2
Alpha-Fetoprotein: 76.5 ng/mL
Crown Rump Length: 63.2 mm
DIA MoM: 2.03
DIA VALUE: 349.6 pg/mL
Estriol, Unconjugated: 1.68 ng/mL
Gest. Age on Collection Date: 12.6 weeks
Gestational Age: 16.6 weeks
HCG MOM: 0.72
Maternal Age at EDD: 28 yr
Nuchal Translucency (NT): 1.2 mm
Nuchal Translucency MoM: 0.75
Number of Fetuses: 1
PAPP-A MOM: 1.28
PAPP-A VALUE: 1319.2 ng/mL
Test Results:: NEGATIVE
WEIGHT: 141 [lb_av]
Weight: 141 [lb_av]
hCG Value: 22.3 IU/mL
uE3 MoM: 1.92

## 2016-09-08 ENCOUNTER — Ambulatory Visit (INDEPENDENT_AMBULATORY_CARE_PROVIDER_SITE_OTHER): Payer: Managed Care, Other (non HMO) | Admitting: Obstetrics and Gynecology

## 2016-09-08 ENCOUNTER — Encounter: Payer: Self-pay | Admitting: Obstetrics and Gynecology

## 2016-09-08 ENCOUNTER — Ambulatory Visit (INDEPENDENT_AMBULATORY_CARE_PROVIDER_SITE_OTHER): Payer: Managed Care, Other (non HMO)

## 2016-09-08 VITALS — BP 122/80 | HR 96 | Wt 139.0 lb

## 2016-09-08 DIAGNOSIS — Z1389 Encounter for screening for other disorder: Secondary | ICD-10-CM

## 2016-09-08 DIAGNOSIS — Z363 Encounter for antenatal screening for malformations: Secondary | ICD-10-CM

## 2016-09-08 DIAGNOSIS — Z3402 Encounter for supervision of normal first pregnancy, second trimester: Secondary | ICD-10-CM

## 2016-09-08 DIAGNOSIS — Z331 Pregnant state, incidental: Secondary | ICD-10-CM

## 2016-09-08 DIAGNOSIS — Z3A18 18 weeks gestation of pregnancy: Secondary | ICD-10-CM

## 2016-09-08 DIAGNOSIS — Z3401 Encounter for supervision of normal first pregnancy, first trimester: Secondary | ICD-10-CM

## 2016-09-08 LAB — POCT URINALYSIS DIPSTICK
Glucose, UA: NEGATIVE
Ketones, UA: NEGATIVE
LEUKOCYTES UA: NEGATIVE
NITRITE UA: NEGATIVE
PROTEIN UA: NEGATIVE

## 2016-09-08 NOTE — Progress Notes (Signed)
Allison Prince is a 26 y.o. female G1P0  Estimated Date of Delivery: 02/03/17 LROB [redacted]w[redacted]d  Chief Complaint  Patient presents with  . Routine Prenatal Visit    ultrasound  ____  Patient has no complaints today. Pt went to the dentist last Wednesday and had a tooth pulled (abcess). Pt was not prescribed any antibiotics. Patient reports good fetal movement,                          denies any bleeding , rupture of membranes,or regular contractions.  Last menstrual period 03/30/2016.   Urine results:notable for trace blood refer to the ob flow sheet for FH and FHR, ,                          Physical Examination: General appearance - alert, well appearing, and in no distress                                     The gum appears well healed with no purulence tenderness to dental extractions                                      Abdomen - FH 18 cm ,                                                         -FHR 155 bpm                                                         soft, nontender, nondistended, no masses or organomegaly                                      Pelvic - not indicated                                            Questions were answered. Assessment: LROB G1P0 @ [redacted]w[redacted]d Estimated Date of Delivery: 02/03/17                        Normally healing dental extraction site Plan:  Continued routine obstetrical care, LROB  F/u in 4 weeks for LROB  2 By signing my name below, I, Izna Ahmed, attest that this documentation has been prepared under the direction and in the presence of Jonnie Kind, MD. Electronically Signed: Jabier Gauss, Medical Scribe. 09/08/16. 2:49 PM.  I personally performed the services described in this documentation, which was SCRIBED in my presence. The recorded information has been reviewed and considered accurate. It has been edited as necessary during review. Jonnie Kind, MD

## 2016-09-08 NOTE — Progress Notes (Signed)
[place] 11+1 wks,cephalic,post pl gr 0,normal ovaries,mult.fibroids n/c,cx 3.5 cm,svp of fluid 4.3 cm,fhr 155 bpm,efw 273 g,anatomy complete,no obvious abnormalities visualized

## 2016-09-15 ENCOUNTER — Encounter: Payer: Self-pay | Admitting: Family Medicine

## 2016-10-03 ENCOUNTER — Encounter (HOSPITAL_COMMUNITY): Payer: Self-pay | Admitting: *Deleted

## 2016-10-03 ENCOUNTER — Inpatient Hospital Stay (HOSPITAL_COMMUNITY)
Admission: AD | Admit: 2016-10-03 | Discharge: 2016-10-03 | Disposition: A | Discharge status: Home / Self Care | Payer: Managed Care, Other (non HMO) | Source: Ambulatory Visit | Attending: Obstetrics and Gynecology | Admitting: Obstetrics and Gynecology

## 2016-10-03 DIAGNOSIS — O99612 Diseases of the digestive system complicating pregnancy, second trimester: Secondary | ICD-10-CM | POA: Diagnosis not present

## 2016-10-03 DIAGNOSIS — F17210 Nicotine dependence, cigarettes, uncomplicated: Secondary | ICD-10-CM | POA: Insufficient documentation

## 2016-10-03 DIAGNOSIS — O99332 Smoking (tobacco) complicating pregnancy, second trimester: Secondary | ICD-10-CM | POA: Insufficient documentation

## 2016-10-03 DIAGNOSIS — Z886 Allergy status to analgesic agent status: Secondary | ICD-10-CM | POA: Insufficient documentation

## 2016-10-03 DIAGNOSIS — R109 Unspecified abdominal pain: Secondary | ICD-10-CM | POA: Diagnosis present

## 2016-10-03 DIAGNOSIS — Z3A22 22 weeks gestation of pregnancy: Secondary | ICD-10-CM | POA: Insufficient documentation

## 2016-10-03 DIAGNOSIS — O26892 Other specified pregnancy related conditions, second trimester: Secondary | ICD-10-CM | POA: Insufficient documentation

## 2016-10-03 DIAGNOSIS — K5900 Constipation, unspecified: Secondary | ICD-10-CM | POA: Insufficient documentation

## 2016-10-03 DIAGNOSIS — Z3402 Encounter for supervision of normal first pregnancy, second trimester: Secondary | ICD-10-CM

## 2016-10-03 DIAGNOSIS — O26899 Other specified pregnancy related conditions, unspecified trimester: Secondary | ICD-10-CM

## 2016-10-03 LAB — URINALYSIS, COMPLETE (UACMP) WITH MICROSCOPIC
BILIRUBIN URINE: NEGATIVE
Bacteria, UA: NONE SEEN
Glucose, UA: NEGATIVE mg/dL
Hgb urine dipstick: NEGATIVE
Ketones, ur: NEGATIVE mg/dL
LEUKOCYTES UA: NEGATIVE
NITRITE: NEGATIVE
PH: 6 (ref 5.0–8.0)
Protein, ur: NEGATIVE mg/dL
SPECIFIC GRAVITY, URINE: 1.015 (ref 1.005–1.030)
SQUAMOUS EPITHELIAL / LPF: NONE SEEN

## 2016-10-03 LAB — WET PREP, GENITAL
Clue Cells Wet Prep HPF POC: NONE SEEN
SPERM: NONE SEEN
TRICH WET PREP: NONE SEEN
Yeast Wet Prep HPF POC: NONE SEEN

## 2016-10-03 MED ORDER — LACTATED RINGERS IV BOLUS (SEPSIS)
1000.0000 mL | Freq: Once | INTRAVENOUS | Status: AC
  Administered 2016-10-03: 1000 mL via INTRAVENOUS

## 2016-10-03 NOTE — MAU Provider Note (Signed)
Patient Allison Prince is a 26 y.o. G1P0 At [redacted]w[redacted]d here with complaints of cramping. She denies bleeding, discharge or decreased fetal movements.   Patient's history is significant for multiple fibroids in this pregnancy. She has a prenatal visit at [facility] on Monday.  History     CSN: [id number]  Arrival date and time: 10/03/16 0745   None     Chief Complaint  Patient presents with  . Abdominal Pain   Abdominal Pain  This is a new problem. The current episode started yesterday. The problem occurs constantly (she feels like its all the time but it can vary in severity between mild and very painful. ). The pain is located in the periumbilical region and suprapubic region. The pain is at a severity of 8/10. The quality of the pain is cramping. Pain radiation: she feels it in the suprapubic region and then it moves to her back. sometimes she feels it all over her belly.  Associated symptoms include constipation and vomiting. Pertinent negatives include no dysuria. Exacerbated by: made worse by standing up or walking, it is made better by sitting still. Relieved by: water helped a little last night but not overnight.  Patient has not had a bowel movement in 2 days; she thinks she could be constipated.   OB History    Gravida Para Term Preterm AB Living   1             SAB TAB Ectopic Multiple Live Births                  Past Medical History:  Diagnosis Date  . Allergy   . Medical history non-contributory     Past Surgical History:  Procedure Laterality Date  . NO PAST SURGERIES    . tooth pulled      Family History  Problem Relation Age of Onset  . COPD Mother   . Depression Mother   . Diabetes Mother   . Hyperlipidemia Mother   . Hypertension Mother   . Asthma Brother   . Arthritis Paternal Grandfather     Social History  Substance Use Topics  . Smoking status: Current Every Day Smoker    Packs/day: 0.25    Types: Cigarettes  . Smokeless tobacco: Never Used  .  Alcohol use No     Comment: not since pregnancy    Allergies:  Allergies  Allergen Reactions  . Asa [Aspirin] Nausea And Vomiting  . Latex Itching and Rash    Prescriptions Prior to Admission  Medication Sig Dispense Refill Last Dose  . acetaminophen (TYLENOL) 500 MG tablet Take 1,000 mg by mouth every 6 (six) hours as needed.    Taking  . Prenatal Vit-Fe Fumarate-FA (PRENATAL VITAMIN) 27-0.8 MG TABS Take by mouth daily.    Taking    Review of Systems  Constitutional: Negative.   Respiratory: Negative.   Cardiovascular: Negative.   Gastrointestinal: Positive for abdominal pain, constipation and vomiting.  Genitourinary: Negative for dysuria.  Neurological: Negative.    Physical Exam   Blood pressure 109/65, pulse 85, temperature 98.2 F (36.8 C), temperature source Oral, resp. rate 16, weight 141 lb 12 oz (64.3 kg), last menstrual period 03/30/2016, SpO2 100 %.  Physical Exam  Constitutional: She is oriented to person, place, and time. She appears well-developed.  HENT:  Head: Normocephalic.  Neck: Normal range of motion.  Respiratory: Effort normal.  GI: Soft.  Genitourinary:  Genitourinary Comments: NEFG; no blood, fluid or discharge visualized in  the vagina. No CMT, suprapubic or adnexal tenderness. Cervix is fingertip, long and posterior.   Musculoskeletal: Normal range of motion.  Neurological: She is alert and oriented to person, place, and time. She has normal reflexes.  Skin: Skin is dry.    MAU Course  Procedures  MDM -Although patient's urine did not show dehyrdration, attempted IV LR to see if its helped with the abdominal pain.  -Cervix is unchanged in two hours of monitoring in the MAU.  -Patient was extremely comfortable-appearing in the MAU, talking with her visitor and using her phone. When I inquired about her pain while in MAU she said "its always there" although patient appeared to be in no discomfort.   At this point index of suspicious for PTL  and pre-term delivery. Recommend that patient try colace for two days and a Fleet enema if no improvement. I explained that her abdominal pain may also be due to her fibroids.   [place]: 138 Assessment and Plan   1. Constipation during pregnancy in second trimester   2. Encounter for supervision of normal first pregnancy in second trimester   3. Abdominal pain affecting pregnancy, antepartum    2. Patient stable to discharge with constipation relief instructions.  3. Return precautions reviewed; patient to keep appt at [facility] on Monday, Sept 10.   [name] 10/03/2016, 8:27 AM

## 2016-10-03 NOTE — Discharge Instructions (Signed)
-  purchase docusate sodium and take 3 times per day; do not use suppositories. If no bowel movement by Saturday night, try an at home Fleet Enema.    Constipation, Adult Constipation is when a person has fewer bowel movements in a week than normal, has difficulty having a bowel movement, or has stools that are dry, hard, or larger than normal. Constipation may be caused by an underlying condition. It may become worse with age if a person takes certain medicines and does not take in enough fluids. Follow these instructions at home: Eating and drinking   Eat foods that have a lot of fiber, such as fresh fruits and vegetables, whole grains, and beans.  Limit foods that are high in fat, low in fiber, or overly processed, such as french fries, hamburgers, cookies, candies, and soda.  Drink enough fluid to keep your urine clear or pale yellow. General instructions  Exercise regularly or as told by your health care provider.  Go to the restroom when you have the urge to go. Do not hold it in.  Take over-the-counter and prescription medicines only as told by your health care provider. These include any fiber supplements.  Practice pelvic floor retraining exercises, such as deep breathing while relaxing the lower abdomen and pelvic floor relaxation during bowel movements.  Watch your condition for any changes.  Keep all follow-up visits as told by your health care provider. This is important. Contact a health care provider if:  You have pain that gets worse.  You have a fever.  You do not have a bowel movement after 4 days.  You vomit.  You are not hungry.  You lose weight.  You are bleeding from the anus.  You have thin, pencil-like stools. Get help right away if:  You have a fever and your symptoms suddenly get worse.  You leak stool or have blood in your stool.  Your abdomen is bloated.  You have severe pain in your abdomen.  You feel dizzy or you faint. This  information is not intended to replace advice given to you by your health care provider. Make sure you discuss any questions you have with your health care provider. Document Released: 10/12/2003 Document Revised: 08/03/2015 Document Reviewed: 07/04/2015 Elsevier Interactive Patient Education  2017 [employer].

## 2016-10-03 NOTE — MAU Note (Signed)
Cramping, wraps around to her back. Started yesterday, has gotten worse and is constant.  Almost feels like she has to go the bathroom, but can't.  Has been feeling a little nausea. Denies constipation.

## 2016-10-06 ENCOUNTER — Encounter: Payer: Self-pay | Admitting: Obstetrics & Gynecology

## 2016-10-06 ENCOUNTER — Ambulatory Visit (INDEPENDENT_AMBULATORY_CARE_PROVIDER_SITE_OTHER): Payer: Managed Care, Other (non HMO) | Admitting: Obstetrics & Gynecology

## 2016-10-06 VITALS — BP 112/68 | HR 87 | Wt 144.0 lb

## 2016-10-06 DIAGNOSIS — Z1389 Encounter for screening for other disorder: Secondary | ICD-10-CM

## 2016-10-06 DIAGNOSIS — Z3A22 22 weeks gestation of pregnancy: Secondary | ICD-10-CM

## 2016-10-06 DIAGNOSIS — Z331 Pregnant state, incidental: Secondary | ICD-10-CM

## 2016-10-06 DIAGNOSIS — Z3402 Encounter for supervision of normal first pregnancy, second trimester: Secondary | ICD-10-CM

## 2016-10-06 LAB — GC/CHLAMYDIA PROBE AMP (~~LOC~~) NOT AT ARMC
Chlamydia: NEGATIVE
NEISSERIA GONORRHEA: NEGATIVE

## 2016-10-06 LAB — POCT URINALYSIS DIPSTICK
Blood, UA: NEGATIVE
Glucose, UA: NEGATIVE
KETONES UA: NEGATIVE
Leukocytes, UA: NEGATIVE
Nitrite, UA: NEGATIVE
Protein, UA: NEGATIVE

## 2016-10-06 MED ORDER — PRENATAL VITAMIN 27-0.8 MG PO TABS: 1.0000 | ORAL_TABLET | Freq: Every day | ORAL | 11 refills | Status: DC

## 2016-10-06 NOTE — Progress Notes (Signed)
G1P0 [redacted]w[redacted]d Estimated Date of Delivery: 02/03/17  Blood pressure 112/68, pulse 87, weight 144 lb (65.3 kg), last menstrual period 03/30/2016.   BP weight and urine results all reviewed and noted.  Please refer to the obstetrical flow sheet for the fundal height and fetal heart rate documentation:  Patient reports good fetal movement, denies any bleeding and no rupture of membranes symptoms or regular contractions. Patient is without complaints. All questions were answered.  Orders Placed This Encounter  Procedures  . POCT urinalysis dipstick    Plan:  Continued routine obstetrical care,   Return in about 4 weeks (around 11/03/2016) for PN2, LROB.

## 2016-10-13 ENCOUNTER — Encounter: Payer: Self-pay | Admitting: Family Medicine

## 2016-10-13 ENCOUNTER — Telehealth: Payer: Self-pay | Admitting: Obstetrics & Gynecology

## 2016-10-13 NOTE — Telephone Encounter (Signed)
Patient called stating she checked her blood pressure at work and it was in the 90's. She was feeling a little light headed but is better now. Informed patient that a decrease in blood pressure can occur in the second trimester. Advised to push fluids and eat small frequent meals and to let us know if she has any other problems. Verbalized understanding.

## 2016-11-03 ENCOUNTER — Ambulatory Visit (INDEPENDENT_AMBULATORY_CARE_PROVIDER_SITE_OTHER): Payer: Managed Care, Other (non HMO) | Admitting: Women's Health

## 2016-11-03 ENCOUNTER — Encounter: Payer: Self-pay | Admitting: Women's Health

## 2016-11-03 ENCOUNTER — Other Ambulatory Visit: Payer: Managed Care, Other (non HMO)

## 2016-11-03 VITALS — BP 90/60 | HR 80 | Wt 145.0 lb

## 2016-11-03 DIAGNOSIS — R3911 Hesitancy of micturition: Secondary | ICD-10-CM

## 2016-11-03 DIAGNOSIS — Z3402 Encounter for supervision of normal first pregnancy, second trimester: Secondary | ICD-10-CM

## 2016-11-03 DIAGNOSIS — Z1389 Encounter for screening for other disorder: Secondary | ICD-10-CM

## 2016-11-03 DIAGNOSIS — Z131 Encounter for screening for diabetes mellitus: Secondary | ICD-10-CM

## 2016-11-03 DIAGNOSIS — Z3A26 26 weeks gestation of pregnancy: Secondary | ICD-10-CM

## 2016-11-03 DIAGNOSIS — Z331 Pregnant state, incidental: Secondary | ICD-10-CM

## 2016-11-03 LAB — POCT URINALYSIS DIPSTICK
Glucose, UA: NEGATIVE
KETONES UA: NEGATIVE
LEUKOCYTES UA: NEGATIVE
Nitrite, UA: NEGATIVE
RBC UA: NEGATIVE

## 2016-11-03 NOTE — Progress Notes (Signed)
   [place] Pregnancy Visit  Patient name: Allison Prince MRN [id number]  Date of birth: 30-Dec-1990 CC & HPI:  Allison Prince is a 26 y.o. G1P0 female at [redacted]w[redacted]d with an Estimated Date of Delivery: 02/03/17 being seen today for ongoing management of a low-risk pregnancy.  Today she reports some sensation of needing to void, but only a little comes out, no other UTI sx. States she tried calling [facility] to establish care, thinks they have been playing phone tag- discussed to call again today, has to establish care by 28wks.  Review of Systems:   Reports good fm.   Denies regular contractions, leakage of fluid, vaginal bleeding, abnormal vaginal discharge w/ itching/odor/irritation, headaches, visual changes, shortness of breath, chest pain, abdominal pain, severe nausea/vomiting, or problems with bowel movements unless otherwise stated above. Pertinent History Reviewed:  Reviewed past medical,surgical, social and family history.  Reviewed problem list, medications and allergies. Objective Findings:   Vitals:   11/03/16 0901  BP: 90/60  Pulse: 80  Weight: 145 lb (65.8 kg)    Body mass index is 26.52 kg/m.  Fundal height: 25 Fetal heart rate: 144 SVE: n/a  Results for orders placed or performed in visit on 11/03/16 (from the past 24 hour(s))  POCT urinalysis dipstick   Collection Time: 11/03/16  9:06 AM  Result Value Ref Range   Color, UA     Clarity, UA     Glucose, UA neg    Bilirubin, UA     Ketones, UA neg    Spec Grav, UA  1.010 - 1.025   Blood, UA neg    pH, UA  5.0 - 8.0   Protein, UA trace    Urobilinogen, UA  0.2 or 1.0 E.U./dL   Nitrite, UA neg    Leukocytes, UA Negative Negative    Assessment & Plan:  1) Low-risk pregnancy G1P0 at [redacted]w[redacted]d with an Estimated Date of Delivery: 02/03/17  2) Urinary hesitancy>will send urine cx  Flu shot and PN2 today  Reviewed: ptl s/s, fkc. Recommended Tdap at HD/PCP per CDC guidelines.  All questions were  answered Plan:  Continue routine obstetrical care   Return in about 4 weeks (around 12/01/2016) for [place].  Orders Placed This Encounter  Procedures  . Urine Culture  . POCT urinalysis dipstick   Tawnya Crook CNM, [facility] 11/03/2016 9:32 AM

## 2016-11-03 NOTE — Patient Instructions (Addendum)
Call the office [phone]) or go to [facility] if:  You begin to have strong, frequent contractions  Your water breaks.  Sometimes it is a big gush of fluid, sometimes it is just a trickle that keeps getting your panties wet or running down your legs  You have vaginal bleeding.  It is normal to have a small amount of spotting if your cervix was checked.   You don't feel your baby moving like normal.  If you don't, get you something to eat and drink and lay down and focus on feeling your baby move.  You should feel at least 10 movements in 2 hours.  If you don't, you should call the office or go to [facility].    Tdap Vaccine  It is recommended that you get the Tdap vaccine during the third trimester of EACH pregnancy to help protect your baby from getting pertussis (whooping cough)  27-36 weeks is the BEST time to do this so that you can pass the protection on to your baby. During pregnancy is better than after pregnancy, but if you are unable to get it during pregnancy it will be offered at the hospital.   You can get this vaccine at the health department or your family doctor  Everyone who will be around your baby should also be up-to-date on their vaccines. Adults (who are not pregnant) only need 1 dose of Tdap during adulthood.   [place] Pediatricians/Family Doctors:  [facility] [phone]                 [place] [phone] (usually not accepting new patients unless you have family there already, you are always welcome to call and ask)       [facility] Department [phone]       [facility] Pediatricians/Family Doctors:   [facility]: [phone]  [facility]: [phone]    Third Trimester of Pregnancy The third trimester is from week 29 through week 42, months 7 through 9. The third trimester is a time when the fetus is growing rapidly.  At the end of the ninth month, the fetus is about 20 inches in length and weighs 6-10 pounds.  BODY CHANGES Your body goes through many changes during pregnancy. The changes vary from woman to woman.   Your weight will continue to increase. You can expect to gain 25-35 pounds (11-16 kg) by the end of the pregnancy.  You may begin to get stretch marks on your hips, abdomen, and breasts.  You may urinate more often because the fetus is moving lower into your pelvis and pressing on your bladder.  You may develop or continue to have heartburn as a result of your pregnancy.  You may develop constipation because certain hormones are causing the muscles that push waste through your intestines to slow down.  You may develop hemorrhoids or swollen, bulging veins (varicose veins).  You may have pelvic pain because of the weight gain and pregnancy hormones relaxing your joints between the bones in your pelvis. Backaches may result from overexertion of the muscles supporting your posture.  You may have changes in your hair. These can include thickening of your hair, rapid growth, and changes in texture. Some women also have hair loss during or after pregnancy, or hair that feels dry or thin. Your hair will most likely return to normal after your baby is born.  Your  breasts will continue to grow and be tender. A yellow discharge may leak from your breasts called colostrum.  Your belly button may stick out.  You may feel short of breath because of your expanding uterus.  You may notice the fetus "dropping," or moving lower in your abdomen.  You may have a bloody mucus discharge. This usually occurs a few days to a week before labor begins.  Your cervix becomes thin and soft (effaced) near your due date. WHAT TO EXPECT AT YOUR PRENATAL EXAMS  You will have prenatal exams every 2 weeks until week 36. Then, you will have weekly prenatal exams. During a routine prenatal visit:  You will be weighed to  make sure you and the fetus are growing normally.  Your blood pressure is taken.  Your abdomen will be measured to track your baby's growth.  The fetal heartbeat will be listened to.  Any test results from the previous visit will be discussed.  You may have a cervical check near your due date to see if you have effaced. At around 36 weeks, your caregiver will check your cervix. At the same time, your caregiver will also perform a test on the secretions of the vaginal tissue. This test is to determine if a type of bacteria, Group B streptococcus, is present. Your caregiver will explain this further. Your caregiver may ask you:  What your birth plan is.  How you are feeling.  If you are feeling the baby move.  If you have had any abnormal symptoms, such as leaking fluid, bleeding, severe headaches, or abdominal cramping.  If you have any questions. Other tests or screenings that may be performed during your third trimester include:  Blood tests that check for low iron levels (anemia).  Fetal testing to check the health, activity level, and growth of the fetus. Testing is done if you have certain medical conditions or if there are problems during the pregnancy. FALSE LABOR You may feel small, irregular contractions that eventually go away. These are called Braxton Hicks contractions, or false labor. Contractions may last for hours, days, or even weeks before true labor sets in. If contractions come at regular intervals, intensify, or become painful, it is best to be seen by your caregiver.  SIGNS OF LABOR   Menstrual-like cramps.  Contractions that are 5 minutes apart or less.  Contractions that start on the top of the uterus and spread down to the lower abdomen and back.  A sense of increased pelvic pressure or back pain.  A watery or bloody mucus discharge that comes from the vagina. If you have any of these signs before the 37th week of pregnancy, call your caregiver right  away. You need to go to the hospital to get checked immediately. HOME CARE INSTRUCTIONS   Avoid all smoking, herbs, alcohol, and unprescribed drugs. These chemicals affect the formation and growth of the baby.  Follow your caregiver's instructions regarding medicine use. There are medicines that are either safe or unsafe to take during pregnancy.  Exercise only as directed by your caregiver. Experiencing uterine cramps is a good sign to stop exercising.  Continue to eat regular, healthy meals.  Wear a good support bra for breast tenderness.  Do not use hot tubs, steam rooms, or saunas.  Wear your seat belt at all times when driving.  Avoid raw meat, uncooked cheese, cat litter boxes, and soil used by cats. These carry germs that can cause birth defects in the baby.  Take  your prenatal vitamins.  Try taking a stool softener (if your caregiver approves) if you develop constipation. Eat more high-fiber foods, such as fresh vegetables or fruit and whole grains. Drink plenty of fluids to keep your urine clear or pale yellow.  Take warm sitz baths to soothe any pain or discomfort caused by hemorrhoids. Use hemorrhoid cream if your caregiver approves.  If you develop varicose veins, wear support hose. Elevate your feet for 15 minutes, 3-4 times a day. Limit salt in your diet.  Avoid heavy lifting, wear low heal shoes, and practice good posture.  Rest a lot with your legs elevated if you have leg cramps or low back pain.  Visit your dentist if you have not gone during your pregnancy. Use a soft toothbrush to brush your teeth and be gentle when you floss.  A sexual relationship may be continued unless your caregiver directs you otherwise.  Do not travel far distances unless it is absolutely necessary and only with the approval of your caregiver.  Take prenatal classes to understand, practice, and ask questions about the labor and delivery.  Make a trial run to the hospital.  Pack your  hospital bag.  Prepare the baby's nursery.  Continue to go to all your prenatal visits as directed by your caregiver. SEEK MEDICAL CARE IF:  You are unsure if you are in labor or if your water has broken.  You have dizziness.  You have mild pelvic cramps, pelvic pressure, or nagging pain in your abdominal area.  You have persistent nausea, vomiting, or diarrhea.  You have a bad smelling vaginal discharge.  You have pain with urination. SEEK IMMEDIATE MEDICAL CARE IF:   You have a fever.  You are leaking fluid from your vagina.  You have spotting or bleeding from your vagina.  You have severe abdominal cramping or pain.  You have rapid weight loss or gain.  You have shortness of breath with chest pain.  You notice sudden or extreme swelling of your face, hands, ankles, feet, or legs.  You have not felt your baby move in over an hour.  You have severe headaches that do not go away with medicine.  You have vision changes. Document Released: 01/07/2001 Document Revised: 01/18/2013 Document Reviewed: 03/16/2012 [facility] Patient Information 2015 [place], [place]. This information is not intended to replace advice given to you by your health care provider. Make sure you discuss any questions you have with your health care provider.

## 2016-11-04 LAB — CBC
Hematocrit: 34.7 % (ref 34.0–46.6)
Hemoglobin: 11.5 g/dL (ref 11.1–15.9)
MCH: 29.1 pg (ref 26.6–33.0)
MCHC: 33.1 g/dL (ref 31.5–35.7)
MCV: 88 fL (ref 79–97)
PLATELETS: 294 10*3/uL (ref 150–379)
RBC: 3.95 x10E6/uL (ref 3.77–5.28)
RDW: 14.3 % (ref 12.3–15.4)
WBC: 10.2 10*3/uL (ref 3.4–10.8)

## 2016-11-04 LAB — GLUCOSE TOLERANCE, 2 HOURS W/ 1HR
GLUCOSE, 1 HOUR: 125 mg/dL (ref 65–179)
GLUCOSE, 2 HOUR: 82 mg/dL (ref 65–152)
GLUCOSE, FASTING: 72 mg/dL (ref 65–91)

## 2016-11-04 LAB — HIV ANTIBODY (ROUTINE TESTING W REFLEX): HIV SCREEN 4TH GENERATION: NONREACTIVE

## 2016-11-04 LAB — ANTIBODY SCREEN: ANTIBODY SCREEN: NEGATIVE

## 2016-11-04 LAB — RPR: RPR Ser Ql: NONREACTIVE

## 2016-11-05 LAB — URINE CULTURE: Organism ID, Bacteria: NO GROWTH

## 2016-12-01 ENCOUNTER — Ambulatory Visit (INDEPENDENT_AMBULATORY_CARE_PROVIDER_SITE_OTHER): Payer: Managed Care, Other (non HMO) | Admitting: Obstetrics and Gynecology

## 2016-12-01 ENCOUNTER — Encounter: Payer: Self-pay | Admitting: Obstetrics and Gynecology

## 2016-12-01 VITALS — BP 112/70 | HR 90 | Wt 145.6 lb

## 2016-12-01 DIAGNOSIS — Z1389 Encounter for screening for other disorder: Secondary | ICD-10-CM

## 2016-12-01 DIAGNOSIS — Z3403 Encounter for supervision of normal first pregnancy, third trimester: Secondary | ICD-10-CM

## 2016-12-01 DIAGNOSIS — Z331 Pregnant state, incidental: Secondary | ICD-10-CM

## 2016-12-01 DIAGNOSIS — Z3A30 30 weeks gestation of pregnancy: Secondary | ICD-10-CM

## 2016-12-01 LAB — POCT URINALYSIS DIPSTICK
Glucose, UA: NEGATIVE
KETONES UA: NEGATIVE
LEUKOCYTES UA: NEGATIVE
Nitrite, UA: NEGATIVE
Protein, UA: NEGATIVE

## 2016-12-01 NOTE — Patient Instructions (Signed)
(  [phone] is the phone number for Pregnancy Classes or hospital tours at [facility].  ° °You will be referred to  [email or web address]   for more information on childbirth classes   °At this site you may register for classes. You may sign up for a waiting list if classes are full. Please SIGN UP FOR THIS!.   When the waiting list becomes long, sometimes new classes can be added. ° ° ° °

## 2016-12-01 NOTE — Progress Notes (Signed)
Patient ID: Allison Prince, female   DOB: [birth year], 26 y.o.   MRN: [id number] G1P0  Estimated Date of Delivery: 02/03/17 [facility] [redacted]w[redacted]d  Chief Complaint  Patient presents with  . Routine Prenatal Visit  ____  Patient complaints: Pt denies any complaints at this time. She reports that she hasn't yet signed up for child birthing classes, but notes that she is planning to attend with the baby's father. Patient reports good fetal movement, denies any bleeding, rupture of membranes, or regular contractions.  Blood pressure 112/70, pulse 90, weight 145 lb 9.6 oz (66 kg), last menstrual period 03/30/2016.   Urine results:notable for trace blood, otherwise negative refer to the ob flow sheet for FH and FHR, ,                          Physical Examination: General appearance - alert, well appearing, and in no distress and oriented to person, place, and time                                      Abdomen - FH 29 cm,                                                         -FHR 156                                           Questions were answered. Assessment: LROB G1P0 @ [redacted]w[redacted]d   Plan:  Continued routine obstetrical care  F/u in 2 weeks for LROB  By signing my name below, I, Margit Banda, attest that this documentation has been prepared under the direction and in the presence of Jonnie Kind, MD. Electronically Signed: Margit Banda, Medical Scribe. 12/01/16. 2:24 PM.  I personally performed the services described in this documentation, which was SCRIBED in my presence. The recorded information has been reviewed and considered accurate. It has been edited as necessary during review. Jonnie Kind, MD

## 2016-12-15 ENCOUNTER — Encounter: Payer: Self-pay | Admitting: Obstetrics & Gynecology

## 2016-12-15 ENCOUNTER — Ambulatory Visit (INDEPENDENT_AMBULATORY_CARE_PROVIDER_SITE_OTHER): Payer: Managed Care, Other (non HMO) | Admitting: Obstetrics & Gynecology

## 2016-12-15 VITALS — BP 110/60 | HR 98 | Wt 147.8 lb

## 2016-12-15 DIAGNOSIS — Z1389 Encounter for screening for other disorder: Secondary | ICD-10-CM

## 2016-12-15 DIAGNOSIS — Z3A32 32 weeks gestation of pregnancy: Secondary | ICD-10-CM

## 2016-12-15 DIAGNOSIS — Z331 Pregnant state, incidental: Secondary | ICD-10-CM

## 2016-12-15 DIAGNOSIS — Z3403 Encounter for supervision of normal first pregnancy, third trimester: Secondary | ICD-10-CM

## 2016-12-15 LAB — POCT URINALYSIS DIPSTICK
GLUCOSE UA: NEGATIVE
Ketones, UA: NEGATIVE
Leukocytes, UA: NEGATIVE
NITRITE UA: NEGATIVE
PROTEIN UA: 1
RBC UA: NEGATIVE

## 2016-12-15 NOTE — Progress Notes (Signed)
G1P0  Estimated Date of Delivery: 02/03/17 LROB [redacted]w[redacted]d  Chief Complaint  Patient presents with  . Routine Prenatal Visit  ____  Patient complaints:no concerns today. Patient reports   good fetal movement,                          She denies any bleeding , rupture of membranes,or regular contractions.  Blood pressure 110/60, pulse 98, weight 147 lb 12.8 oz (67 kg), last menstrual period 03/30/2016.   Urine results:notable for 1+ protein refer to the ob flow sheet for FH and FHR, ,                          Physical Examination: General appearance - alert, well appearing, and in no distress                                      Abdomen - FH 32cm ,                                                         -FHR 146                                                         soft, nontender, nondistended, no masses or organomegaly                                      Pelvic - examination not indicated                                            Questions were answered. Assessment: LROB G1P0 @ [redacted]w[redacted]d   Plan:  Continued routine obstetrical care  F/u in 2 weeks for LROB  Bufford Lope, DO PGY-2, [facility] 12/15/2016 2:17 PM

## 2016-12-29 ENCOUNTER — Ambulatory Visit (INDEPENDENT_AMBULATORY_CARE_PROVIDER_SITE_OTHER): Payer: Managed Care, Other (non HMO) | Admitting: Women's Health

## 2016-12-29 ENCOUNTER — Other Ambulatory Visit: Payer: Self-pay

## 2016-12-29 ENCOUNTER — Encounter: Payer: Self-pay | Admitting: Women's Health

## 2016-12-29 VITALS — BP 102/64 | HR 94 | Wt 151.0 lb

## 2016-12-29 DIAGNOSIS — Z3A34 34 weeks gestation of pregnancy: Secondary | ICD-10-CM

## 2016-12-29 DIAGNOSIS — O99013 Anemia complicating pregnancy, third trimester: Secondary | ICD-10-CM | POA: Diagnosis not present

## 2016-12-29 DIAGNOSIS — Z3403 Encounter for supervision of normal first pregnancy, third trimester: Secondary | ICD-10-CM

## 2016-12-29 DIAGNOSIS — F5089 Other specified eating disorder: Secondary | ICD-10-CM

## 2016-12-29 DIAGNOSIS — O99341 Other mental disorders complicating pregnancy, first trimester: Secondary | ICD-10-CM

## 2016-12-29 DIAGNOSIS — D649 Anemia, unspecified: Secondary | ICD-10-CM

## 2016-12-29 DIAGNOSIS — Z331 Pregnant state, incidental: Secondary | ICD-10-CM

## 2016-12-29 DIAGNOSIS — Z1389 Encounter for screening for other disorder: Secondary | ICD-10-CM

## 2016-12-29 LAB — POCT URINALYSIS DIPSTICK
GLUCOSE UA: NEGATIVE
Ketones, UA: NEGATIVE
Leukocytes, UA: NEGATIVE
NITRITE UA: NEGATIVE
RBC UA: NEGATIVE

## 2016-12-29 LAB — POCT HEMOGLOBIN: Hemoglobin: 10.6 g/dL — AB (ref 12.2–16.2)

## 2016-12-29 MED ORDER — FERROUS SULFATE 325 (65 FE) MG PO TABS: 325.0000 mg | ORAL_TABLET | Freq: Two times a day (BID) | ORAL | 3 refills | Status: DC

## 2016-12-29 NOTE — Patient Instructions (Addendum)
Allison Prince, I greatly value your feedback.  If you receive a survey following your visit with [place] today, we appreciate you taking the time to fill it out.  Thanks, Knute Neu, CNM, WHNP-BC   Call the office [phone]) or go to [facility] if:  You begin to have strong, frequent contractions  Your water breaks.  Sometimes it is a big gush of fluid, sometimes it is just a trickle that keeps getting your panties wet or running down your legs  You have vaginal bleeding.  It is normal to have a small amount of spotting if your cervix was checked.   You don't feel your baby moving like normal.  If you don't, get you something to eat and drink and lay down and focus on feeling your baby move.  You should feel at least 10 movements in 2 hours.  If you don't, you should call the office or go to [place] and Birth Information The normal length of a pregnancy is 39-41 weeks. Preterm labor is when labor starts before 37 completed weeks of pregnancy. What are the risk factors for preterm labor? Preterm labor is more likely to occur in women who:  Have certain infections during pregnancy such as a bladder infection, sexually transmitted infection, or infection inside the uterus (chorioamnionitis).  Have a shorter-than-normal cervix.  Have gone into preterm labor before.  Have had surgery on their cervix.  Are younger than age 36 or older than age 66.  Are African American.  Are pregnant with twins or multiple babies (multiple gestation).  Take street drugs or smoke while pregnant.  Do not gain enough weight while pregnant.  Became pregnant shortly after having been pregnant.  What are the symptoms of preterm labor? Symptoms of preterm labor include:  Cramps similar to those that can happen during a menstrual period. The cramps may happen with diarrhea.  Pain in the abdomen or lower back.  Regular uterine contractions that may feel like  tightening of the abdomen.  A feeling of increased pressure in the pelvis.  Increased watery or bloody mucus discharge from the vagina.  Water breaking (ruptured amniotic sac).  Why is it important to recognize signs of preterm labor? It is important to recognize signs of preterm labor because babies who are born prematurely may not be fully developed. This can put them at an increased risk for:  Long-term (chronic) heart and lung problems.  Difficulty immediately after birth with regulating body systems, including blood sugar, body temperature, heart rate, and breathing rate.  Bleeding in the brain.  Cerebral palsy.  Learning difficulties.  Death.  These risks are highest for babies who are born before 12 weeks of pregnancy. How is preterm labor treated? Treatment depends on the length of your pregnancy, your condition, and the health of your baby. It may involve:  Having a stitch (suture) placed in your cervix to prevent your cervix from opening too early (cerclage).  Taking or being given medicines, such as: ? Hormone medicines. These may be given early in pregnancy to help support the pregnancy. ? Medicine to stop contractions. ? Medicines to help mature the baby's lungs. These may be prescribed if the risk of delivery is high. ? Medicines to prevent your baby from developing cerebral palsy.  If the labor happens before 34 weeks of pregnancy, you may need to stay in the hospital. What should I do if I think I am in preterm labor? If  you think that you are going into preterm labor, call your health care provider right away. How can I prevent preterm labor in future pregnancies? To increase your chance of having a full-term pregnancy:  Do not use any tobacco products, such as cigarettes, chewing tobacco, and e-cigarettes. If you need help quitting, ask your health care provider.  Do not use street drugs or medicines that have not been prescribed to you during your  pregnancy.  Talk with your health care provider before taking any herbal supplements, even if you have been taking them regularly.  Make sure you gain a healthy amount of weight during your pregnancy.  Watch for infection. If you think that you might have an infection, get it checked right away.  Make sure to tell your health care provider if you have gone into preterm labor before.  This information is not intended to replace advice given to you by your health care provider. Make sure you discuss any questions you have with your health care provider. Document Released: 04/05/2003 Document Revised: 06/26/2015 Document Reviewed: 06/06/2015 Elsevier Interactive Patient Education  2018 [employer].   Iron-Rich Diet Iron is a mineral that helps your body to produce hemoglobin. Hemoglobin is a protein in your red blood cells that carries oxygen to your body's tissues. Eating too little iron may cause you to feel weak and tired, and it can increase your risk for infection. Eating enough iron is necessary for your body's metabolism, muscle function, and nervous system. Iron is naturally found in many foods. It can also be added to foods or fortified in foods. There are two types of dietary iron:  Heme iron. Heme iron is absorbed by the body more easily than nonheme iron. Heme iron is found in meat, poultry, and fish.  Nonheme iron. Nonheme iron is found in dietary supplements, iron-fortified grains, beans, and vegetables.  You may need to follow an iron-rich diet if:  You have been diagnosed with iron deficiency or iron-deficiency anemia.  You have a condition that prevents you from absorbing dietary iron, such as: ? Infection in your intestines. ? Celiac disease. This involves long-lasting (chronic) inflammation of your intestines.  You do not eat enough iron.  You eat a diet that is high in foods that impair iron absorption.  You have lost a lot of blood.  You have heavy bleeding  during your menstrual cycle.  You are pregnant.  What is my plan? Your health care provider may help you to determine how much iron you need per day based on your condition. Generally, when a person consumes sufficient amounts of iron in the diet, the following iron needs are met:  Men. ? 64-31 years old: 11 mg per day. ? 19-10 years old: 8 mg per day.  Women. ? 2-90 years old: 15 mg per day. ? 58-35 years old: 18 mg per day. ? Over 73 years old: 8 mg per day. ? Pregnant women: 27 mg per day. ? Breastfeeding women: 9 mg per day.  What do I need to know about an iron-rich diet?  Eat fresh fruits and vegetables that are high in vitamin C along with foods that are high in iron. This will help increase the amount of iron that your body absorbs from food, especially with foods containing nonheme iron. Foods that are high in vitamin C include oranges, peppers, tomatoes, and mango.  Take iron supplements only as directed by your health care provider. Overdose of iron can be life-threatening. If  you were prescribed iron supplements, take them with orange juice or a vitamin C supplement.  Cook foods in pots and pans that are made from iron.  Eat nonheme iron-containing foods alongside foods that are high in heme iron. This helps to improve your iron absorption.  Certain foods and drinks contain compounds that impair iron absorption. Avoid eating these foods in the same meal as iron-rich foods or with iron supplements. These include: ? Coffee, black tea, and red wine. ? Milk, dairy products, and foods that are high in calcium. ? Beans, soybeans, and peas. ? Whole grains.  When eating foods that contain both nonheme iron and compounds that impair iron absorption, follow these tips to absorb iron better. ? Soak beans overnight before cooking. ? Soak whole grains overnight and drain them before using. ? Ferment flours before baking, such as using yeast in bread dough. What foods can I  eat? Grains Iron-fortified breakfast cereal. Iron-fortified whole-wheat bread. Enriched rice. Sprouted grains. Vegetables Spinach. Potatoes with skin. Green peas. Broccoli. Red and green bell peppers. Fermented vegetables. Fruits Prunes. Raisins. Oranges. Strawberries. Mango. Grapefruit. Meats and Other Protein Sources Beef liver. Oysters. Beef. Shrimp. [place]. Chicken. [place]. Sardines. Chickpeas. Nuts. Tofu. Beverages Tomato juice. Fresh orange juice. Prune juice. Hibiscus tea. Fortified instant breakfast shakes. Condiments Tahini. Fermented soy sauce. Sweets and Desserts Black-strap molasses. Other Wheat germ. The items listed above may not be a complete list of recommended foods or beverages. Contact your dietitian for more options. What foods are not recommended? Grains Whole grains. Bran cereal. Bran flour. Oats. Vegetables Artichokes. Brussels sprouts. Kale. Fruits Blueberries. Raspberries. Strawberries. Figs. Meats and Other Protein Sources Soybeans. Products made from soy protein. Dairy Milk. Cream. Cheese. Yogurt. Cottage cheese. Beverages Coffee. Black tea. Red wine. Sweets and Desserts Cocoa. Chocolate. Ice cream. Other Basil. Oregano. Parsley. The items listed above may not be a complete list of foods and beverages to avoid. Contact your dietitian for more information. This information is not intended to replace advice given to you by your health care provider. Make sure you discuss any questions you have with your health care provider. Document Released: 08/27/2004 Document Revised: 08/03/2015 Document Reviewed: 08/10/2013 Elsevier Interactive Patient Education  [employer].

## 2016-12-29 NOTE — Progress Notes (Signed)
   LOW-RISK PREGNANCY VISIT Patient name: Allison Prince MRN [id number]  Date of birth: 04-21-1990 Chief Complaint:   Routine Prenatal Visit  History of Present Illness:   Allison Prince is a 26 y.o. G1P0 female at [redacted]w[redacted]d with an Estimated Date of Delivery: 02/03/17 being seen today for ongoing management of a low-risk pregnancy.  Today she reports eats a lot of ice. Denies fatigue, dizziness, sob, cp. Taking pnv daily . Contractions: Irregular. Vag. Bleeding: None.  Movement: Present. denies leaking of fluid. Review of Systems:   Pertinent items are noted in HPI Denies abnormal vaginal discharge w/ itching/odor/irritation, headaches, visual changes, shortness of breath, chest pain, abdominal pain, severe nausea/vomiting, or problems with urination or bowel movements unless otherwise stated above. Pertinent History Reviewed:  Reviewed past medical,surgical, social, obstetrical and family history.  Reviewed problem list, medications and allergies. Physical Assessment:   Vitals:   12/29/16 1437  BP: 102/64  Pulse: 94  Weight: 151 lb (68.5 kg)  Body mass index is 27.62 kg/m.        Physical Examination:   General appearance: Well appearing, and in no distress  Mental status: Alert, oriented to person, place, and time  Skin: Warm & dry  Cardiovascular: Normal heart rate noted  Respiratory: Normal respiratory effort, no distress  Abdomen: Soft, gravid, nontender  Pelvic: Cervical exam deferred         Extremities: Edema: Trace  Fetal Status: Fetal Heart Rate (bpm): 145 Fundal Height: 32 cm Movement: Present    Results for orders placed or performed in visit on 12/29/16 (from the past 24 hour(s))  POCT urinalysis dipstick   Collection Time: 12/29/16  2:39 PM  Result Value Ref Range   Color, UA     Clarity, UA     Glucose, UA neg    Bilirubin, UA     Ketones, UA neg    Spec Grav, UA  1.010 - 1.025   Blood, UA neg    pH, UA  5.0 - 8.0   Protein, UA trace    Urobilinogen,  UA  0.2 or 1.0 E.U./dL   Nitrite, UA neg    Leukocytes, UA Negative Negative  POCT hemoglobin   Collection Time: 12/29/16  3:04 PM  Result Value Ref Range   Hemoglobin 10.6 (A) 12.2 - 16.2 g/dL    Assessment & Plan:  1) Low-risk pregnancy G1P0 at [redacted]w[redacted]d with an Estimated Date of Delivery: 02/03/17   2) Anemia w/ PICA-ice, rx fe bid, increase fe-rich foods   Labs/procedures today: hgb  Plan:  Continue routine obstetrical care   Reviewed: Preterm labor symptoms and general obstetric precautions including but not limited to vaginal bleeding, contractions, leaking of fluid and fetal movement were reviewed in detail with the patient.  All questions were answered  Follow-up: Return in about 2 weeks (around 01/12/2017) for [place]. and gbs  Orders Placed This Encounter  Procedures  . POCT urinalysis dipstick  . POCT hemoglobin   Tawnya Crook CNM, [facility] 12/29/2016 3:04 PM

## 2017-01-12 ENCOUNTER — Ambulatory Visit (INDEPENDENT_AMBULATORY_CARE_PROVIDER_SITE_OTHER): Payer: Managed Care, Other (non HMO) | Admitting: Women's Health

## 2017-01-12 ENCOUNTER — Encounter: Payer: Self-pay | Admitting: Women's Health

## 2017-01-12 VITALS — BP 100/48 | HR 98 | Wt 153.0 lb

## 2017-01-12 DIAGNOSIS — Z3A36 36 weeks gestation of pregnancy: Secondary | ICD-10-CM

## 2017-01-12 DIAGNOSIS — Z331 Pregnant state, incidental: Secondary | ICD-10-CM

## 2017-01-12 DIAGNOSIS — Z3403 Encounter for supervision of normal first pregnancy, third trimester: Secondary | ICD-10-CM

## 2017-01-12 DIAGNOSIS — Z1389 Encounter for screening for other disorder: Secondary | ICD-10-CM

## 2017-01-12 LAB — POCT URINALYSIS DIPSTICK
Glucose, UA: NEGATIVE
KETONES UA: NEGATIVE
LEUKOCYTES UA: NEGATIVE
NITRITE UA: NEGATIVE
PROTEIN UA: NEGATIVE
RBC UA: NEGATIVE

## 2017-01-12 NOTE — Progress Notes (Signed)
   LOW-RISK PREGNANCY VISIT Patient name: JENNESSY SANDRIDGE MRN [id number]  Date of birth: 1990/07/08 Chief Complaint:   Routine Prenatal Visit  History of Present Illness:   Allison Prince is a 26 y.o. G1P0 female at [redacted]w[redacted]d with an Estimated Date of Delivery: 02/03/17 being seen today for ongoing management of a low-risk pregnancy.  Today she reports no complaints. Contractions: Not present. Vag. Bleeding: None.  Movement: Present. denies leaking of fluid. Review of Systems:   Pertinent items are noted in HPI Denies abnormal vaginal discharge w/ itching/odor/irritation, headaches, visual changes, shortness of breath, chest pain, abdominal pain, severe nausea/vomiting, or problems with urination or bowel movements unless otherwise stated above. Pertinent History Reviewed:  Reviewed past medical,surgical, social, obstetrical and family history.  Reviewed problem list, medications and allergies. Physical Assessment:   Vitals:   01/12/17 1428  BP: (!) 100/48  Pulse: 98  Weight: 153 lb (69.4 kg)  Body mass index is 27.98 kg/m.        Physical Examination:   General appearance: Well appearing, and in no distress  Mental status: Alert, oriented to person, place, and time  Skin: Warm & dry  Cardiovascular: Normal heart rate noted  Respiratory: Normal respiratory effort, no distress  Abdomen: Soft, gravid, nontender  Pelvic: Cervical exam performed  Dilation: 1 Effacement (%): Thick Station: -2  Extremities: Edema: Trace  Fetal Status: Fetal Heart Rate (bpm): 147 Fundal Height: 34 cm Movement: Present Presentation: Vertex  Results for orders placed or performed in visit on 01/12/17 (from the past 24 hour(s))  POCT Urinalysis Dipstick   Collection Time: 01/12/17  2:34 PM  Result Value Ref Range   Color, UA     Clarity, UA     Glucose, UA neg    Bilirubin, UA     Ketones, UA neg    Spec Grav, UA  1.010 - 1.025   Blood, UA neg    pH, UA  5.0 - 8.0   Protein, UA neg    Urobilinogen, UA  0.2 or 1.0 E.U./dL   Nitrite, UA neg    Leukocytes, UA Negative Negative   Appearance     Odor      Assessment & Plan:  1) Low-risk pregnancy G1P0 at [redacted]w[redacted]d with an Estimated Date of Delivery: 02/03/17    Labs/procedures today: gbs, gc/ct, sve  Plan:  Continue routine obstetrical care   Reviewed: Preterm labor symptoms and general obstetric precautions including but not limited to vaginal bleeding, contractions, leaking of fluid and fetal movement were reviewed in detail with the patient.  All questions were answered  Follow-up: Return in about 1 week (around 01/19/2017) for [place].  Orders Placed This Encounter  Procedures  . Culture, beta strep (group b only)  . GC/Chlamydia Probe Amp  . POCT Urinalysis Dipstick   Tawnya Crook CNM, [facility] 01/12/2017 3:06 PM

## 2017-01-12 NOTE — Patient Instructions (Addendum)
Allison Prince, I greatly value your feedback.  If you receive a survey following your visit with [place] today, we appreciate you taking the time to fill it out.  Thanks, Allison Prince, CNM, WHNP-BC   Call the office [phone]) or go to [facility] if:  You begin to have strong, frequent contractions  Your water breaks.  Sometimes it is a big gush of fluid, sometimes it is just a trickle that keeps getting your panties wet or running down your legs  You have vaginal bleeding.  It is normal to have a small amount of spotting if your cervix was checked.   You don't feel your baby moving like normal.  If you don't, get you something to eat and drink and lay down and focus on feeling your baby move.  You should feel at least 10 movements in 2 hours.  If you don't, you should call the office or go to [place] Contractions Contractions of the uterus can occur throughout pregnancy, but they are not always a sign that you are in labor. You may have practice contractions called Braxton Hicks contractions. These false labor contractions are sometimes confused with true labor. What are [place] contractions? Braxton Hicks contractions are tightening movements that occur in the muscles of the uterus before labor. Unlike true labor contractions, these contractions do not result in opening (dilation) and thinning of the cervix. Toward the end of pregnancy (32-34 weeks), Braxton Hicks contractions can happen more often and may become stronger. These contractions are sometimes difficult to tell apart from true labor because they can be very uncomfortable. You should not feel embarrassed if you go to the hospital with false labor. Sometimes, the only way to tell if you are in true labor is for your health care provider to look for changes in the cervix. The health care provider will do a physical exam and may monitor your contractions. If you are not in true labor, the exam should  show that your cervix is not dilating and your water has not broken. If there are no prenatal problems or other health problems associated with your pregnancy, it is completely safe for you to be sent home with false labor. You may continue to have Braxton Hicks contractions until you go into true labor. How can I tell the difference between true labor and false labor?  Differences ? False labor ? Contractions last 30-70 seconds.: Contractions are usually shorter and not as strong as true labor contractions. ? Contractions become very regular.: Contractions are usually irregular. ? Discomfort is usually felt in the top of the uterus, and it spreads to the lower abdomen and low back.: Contractions are often felt in the front of the lower abdomen and in the groin. ? Contractions do not go away with walking.: Contractions may go away when you walk around or change positions while lying down. ? Contractions usually become more intense and increase in frequency.: Contractions get weaker and are shorter-lasting as time goes on. ? The cervix dilates and gets thinner.: The cervix usually does not dilate or become thin. Follow these instructions at home:  Take over-the-counter and prescription medicines only as told by your health care provider.  Keep up with your usual exercises and follow other instructions from your health care provider.  Eat and drink lightly if you think you are going into labor.  If Braxton Hicks contractions are making you uncomfortable: ? Change your position from lying down  or resting to walking, or change from walking to resting. ? Sit and rest in a tub of warm water. ? Drink enough fluid to keep your urine clear or pale yellow. Dehydration may cause these contractions. ? Do slow and deep breathing several times an hour.  Keep all follow-up prenatal visits as told by your health care provider. This is important. Contact a health care provider if:  You have a  fever.  You have continuous pain in your abdomen. Get help right away if:  Your contractions become stronger, more regular, and closer together.  You have fluid leaking or gushing from your vagina.  You pass blood-tinged mucus (bloody show).  You have bleeding from your vagina.  You have low back pain that you never had before.  You feel your baby's head pushing down and causing pelvic pressure.  Your baby is not moving inside you as much as it used to. Summary  Contractions that occur before labor are called Braxton Hicks contractions, false labor, or practice contractions.  Braxton Hicks contractions are usually shorter, weaker, farther apart, and less regular than true labor contractions. True labor contractions usually become progressively stronger and regular and they become more frequent.  Manage discomfort from [facility] contractions by changing position, resting in a warm bath, drinking plenty of water, or practicing deep breathing. This information is not intended to replace advice given to you by your health care provider. Make sure you discuss any questions you have with your health care provider. Document Released: 01/13/2005 Document Revised: 12/03/2015 Document Reviewed: 12/03/2015 Elsevier Interactive Patient Education  2017 [employer].

## 2017-01-14 LAB — GC/CHLAMYDIA PROBE AMP
CHLAMYDIA, DNA PROBE: NEGATIVE
NEISSERIA GONORRHOEAE BY PCR: NEGATIVE

## 2017-01-16 LAB — OB RESULTS CONSOLE GBS: GBS: NEGATIVE

## 2017-01-16 LAB — CULTURE, BETA STREP (GROUP B ONLY): STREP GP B CULTURE: NEGATIVE

## 2017-01-21 ENCOUNTER — Ambulatory Visit (INDEPENDENT_AMBULATORY_CARE_PROVIDER_SITE_OTHER): Payer: Managed Care, Other (non HMO) | Admitting: Obstetrics and Gynecology

## 2017-01-21 ENCOUNTER — Encounter: Payer: Managed Care, Other (non HMO) | Admitting: Advanced Practice Midwife

## 2017-01-21 ENCOUNTER — Encounter: Payer: Self-pay | Admitting: Obstetrics and Gynecology

## 2017-01-21 VITALS — BP 124/82 | HR 96 | Wt 152.0 lb

## 2017-01-21 DIAGNOSIS — Z3403 Encounter for supervision of normal first pregnancy, third trimester: Secondary | ICD-10-CM

## 2017-01-21 DIAGNOSIS — O3413 Maternal care for benign tumor of corpus uteri, third trimester: Secondary | ICD-10-CM | POA: Diagnosis not present

## 2017-01-21 DIAGNOSIS — Z1389 Encounter for screening for other disorder: Secondary | ICD-10-CM

## 2017-01-21 DIAGNOSIS — O3410 Maternal care for benign tumor of corpus uteri, unspecified trimester: Secondary | ICD-10-CM

## 2017-01-21 DIAGNOSIS — Z3A38 38 weeks gestation of pregnancy: Secondary | ICD-10-CM

## 2017-01-21 DIAGNOSIS — D259 Leiomyoma of uterus, unspecified: Secondary | ICD-10-CM

## 2017-01-21 DIAGNOSIS — Z331 Pregnant state, incidental: Secondary | ICD-10-CM

## 2017-01-21 LAB — POCT URINALYSIS DIPSTICK
Blood, UA: NEGATIVE
GLUCOSE UA: NEGATIVE
Ketones, UA: NEGATIVE
Leukocytes, UA: NEGATIVE
Nitrite, UA: NEGATIVE
Protein, UA: NEGATIVE

## 2017-01-21 NOTE — Progress Notes (Signed)
   LOW-RISK PREGNANCY VISIT Patient name: Allison Prince MRN [id number]  Date of birth: 02-Dec-1990 Chief Complaint:   Routine Prenatal Visit  History of Present Illness:   Allison Prince is a 26 y.o. G1P0 female at [redacted]w[redacted]d with an Estimated Date of Delivery: 02/03/17 being seen today for ongoing management of a low-risk pregnancy.  Today she reports some right leg swelling. She wears compression socks to help alleviate edema.. Contractions: Irregular. Vag. Bleeding: None.  Movement: Present. denies leaking of fluid.  Review of Systems:   Pertinent items are noted in HPI Denies abnormal vaginal discharge w/ itching/odor/irritation, headaches, visual changes, shortness of breath, chest pain, abdominal pain, severe nausea/vomiting, or problems with urination or bowel movements unless otherwise stated above.  Pertinent History Reviewed:  Reviewed past medical,surgical, social, obstetrical and family history.  Reviewed problem list, medications and allergies.  Physical Assessment:   Vitals:   01/21/17 1527  BP: 124/82  Pulse: 96  Weight: 152 lb (68.9 kg)  Body mass index is 27.8 kg/m.        Physical Examination:   General appearance: Well appearing, and in no distress  Mental status: Alert, oriented to person, place, and time  Skin: Warm & dry  Cardiovascular: Normal heart rate noted  Respiratory: Normal respiratory effort, no distress  Abdomen: Soft, gravid, nontender  Pelvic: Cervical exam performed       +1, long and posterior  Extremities: Edema: Trace  Fetal Status:     Movement: Present   FHR: 165 bpm FH: 34  Results for orders placed or performed in visit on 01/21/17 (from the past 24 hour(s))  POCT urinalysis dipstick   Collection Time: 01/21/17  3:30 PM  Result Value Ref Range   Color, UA     Clarity, UA     Glucose, UA neg    Bilirubin, UA     Ketones, UA neg    Spec Grav, UA  1.010 - 1.025   Blood, UA neg    pH, UA  5.0 - 8.0   Protein, UA neg    Urobilinogen, UA  0.2 or 1.0 E.U./dL   Nitrite, UA neg    Leukocytes, UA Negative Negative   Appearance     Odor      Assessment & Plan:  1) Low-risk pregnancy G1P0 at [redacted]w[redacted]d with an Estimated Date of Delivery: 02/03/17    Labs/procedures today: Doppler  Plan:  Continue routine obstetrical care  Reviewed: Preterm labor symptoms and general obstetric precautions including but not limited to vaginal bleeding, contractions, leaking of fluid and fetal movement were reviewed in detail with the patient.  All questions were answered  Follow-up: Return in about 1 week (around 01/28/2017) for Daniels.  Orders Placed This Encounter  Procedures  . POCT urinalysis dipstick     By signing my name below, I, Izna Ahmed, attest that this documentation has been prepared under the direction and in the presence of Jonnie Kind., MD Electronically Signed: Jabier Gauss, Medical Scribe. 01/21/17. 3:36 PM  I personally performed the services described in this documentation, which was SCRIBED in my presence. The recorded information has been reviewed and considered accurate. It has been edited as necessary during review. Jonnie Kind, MD

## 2017-01-27 NOTE — L&D Delivery Note (Signed)
Patient is 27 y.o. G1P0 [redacted]w[redacted]d admitted for leaking fluid. S/p IOL with oral cytotec x 1 followed by Pitocin. Prenatal course also complicated by gestational hypertension, smoker, and multiple fibroids.   Delivery Note At 6:46 PM a viable female was delivered via  (Presentation: ROA  ).  APGAR 8 and 9: pending ; weight pending .   Placenta status: intact .  Cord: three vessels Cord pH: 7.3  Anesthesia:  Epidural  Episiotomy:  None  Lacerations: none   Est. Blood Loss (mL):  200 cc   Patient had recurrent decels prior to delivery. Vacuum delivery was considered and attending on call was called. Patient had adequate contractions and pushes and deliveried spontaneously. Head delivered ROA. Nuchal cord easily reduced. Shoulder and body delivered in usual fashion. Infant with spontaneous cry, placed on mother's abdomen, dried and bulb suctioned. Cord clamped x 2 after 1-minute delay, and cut by family member. Cord blood drawn. Arterial blood drawn. Placenta delivered spontaneously with gentle cord traction. Fundus firm with massage and Pitocin. Perineum inspected and found to have no lacerations.  Mom to postpartum.  Baby to Couplet care / Skin to Skin.   [name], DO PGY-1  OB FELLOW DELIVERY ATTESTATION  I was gloved and present for the delivery in its entirety, and I agree with the above resident's note.    Luiz Blare, DO OB Fellow 7:32 PM

## 2017-01-30 ENCOUNTER — Encounter: Payer: Managed Care, Other (non HMO) | Admitting: Obstetrics and Gynecology

## 2017-01-30 ENCOUNTER — Encounter (HOSPITAL_COMMUNITY): Payer: Self-pay

## 2017-01-30 ENCOUNTER — Inpatient Hospital Stay (HOSPITAL_COMMUNITY): Payer: Managed Care, Other (non HMO) | Admitting: Anesthesiology

## 2017-01-30 ENCOUNTER — Other Ambulatory Visit: Payer: Self-pay

## 2017-01-30 ENCOUNTER — Inpatient Hospital Stay (HOSPITAL_COMMUNITY)
Admission: AD | Admit: 2017-01-30 | Discharge: 2017-02-01 | DRG: 807 | Disposition: A | Discharge status: Home / Self Care | Payer: Managed Care, Other (non HMO) | Source: Ambulatory Visit | Attending: Obstetrics and Gynecology | Admitting: Obstetrics and Gynecology

## 2017-01-30 DIAGNOSIS — O99334 Smoking (tobacco) complicating childbirth: Secondary | ICD-10-CM | POA: Diagnosis present

## 2017-01-30 DIAGNOSIS — Z3403 Encounter for supervision of normal first pregnancy, third trimester: Secondary | ICD-10-CM

## 2017-01-30 DIAGNOSIS — O134 Gestational [pregnancy-induced] hypertension without significant proteinuria, complicating childbirth: Secondary | ICD-10-CM | POA: Diagnosis present

## 2017-01-30 DIAGNOSIS — F17210 Nicotine dependence, cigarettes, uncomplicated: Secondary | ICD-10-CM | POA: Diagnosis present

## 2017-01-30 DIAGNOSIS — D259 Leiomyoma of uterus, unspecified: Secondary | ICD-10-CM | POA: Diagnosis present

## 2017-01-30 DIAGNOSIS — O4292 Full-term premature rupture of membranes, unspecified as to length of time between rupture and onset of labor: Secondary | ICD-10-CM | POA: Diagnosis present

## 2017-01-30 DIAGNOSIS — Z3A39 39 weeks gestation of pregnancy: Secondary | ICD-10-CM | POA: Diagnosis not present

## 2017-01-30 DIAGNOSIS — O3413 Maternal care for benign tumor of corpus uteri, third trimester: Secondary | ICD-10-CM | POA: Diagnosis present

## 2017-01-30 DIAGNOSIS — Z91040 Latex allergy status: Secondary | ICD-10-CM | POA: Diagnosis not present

## 2017-01-30 DIAGNOSIS — O133 Gestational [pregnancy-induced] hypertension without significant proteinuria, third trimester: Secondary | ICD-10-CM | POA: Diagnosis present

## 2017-01-30 DIAGNOSIS — Z3400 Encounter for supervision of normal first pregnancy, unspecified trimester: Secondary | ICD-10-CM

## 2017-01-30 HISTORY — DX: Benign neoplasm of connective and other soft tissue, unspecified: D21.9

## 2017-01-30 LAB — CBC
HCT: 39.1 % (ref 36.0–46.0)
Hemoglobin: 13.2 g/dL (ref 12.0–15.0)
MCH: 28.8 pg (ref 26.0–34.0)
MCHC: 33.8 g/dL (ref 30.0–36.0)
MCV: 85.4 fL (ref 78.0–100.0)
PLATELETS: 258 10*3/uL (ref 150–400)
RBC: 4.58 MIL/uL (ref 3.87–5.11)
RDW: 14.7 % (ref 11.5–15.5)
WBC: 8.8 10*3/uL (ref 4.0–10.5)

## 2017-01-30 LAB — COMPREHENSIVE METABOLIC PANEL
ALT: 15 U/L (ref 14–54)
AST: 18 U/L (ref 15–41)
Albumin: 3.3 g/dL — ABNORMAL LOW (ref 3.5–5.0)
Alkaline Phosphatase: 173 U/L — ABNORMAL HIGH (ref 38–126)
Anion gap: 12 (ref 5–15)
BILIRUBIN TOTAL: 0.7 mg/dL (ref 0.3–1.2)
BUN: 8 mg/dL (ref 6–20)
CALCIUM: 9.1 mg/dL (ref 8.9–10.3)
CO2: 18 mmol/L — ABNORMAL LOW (ref 22–32)
CREATININE: 0.49 mg/dL (ref 0.44–1.00)
Chloride: 105 mmol/L (ref 101–111)
GFR calc Af Amer: >60 mL/min (ref >60)
Glucose, Bld: 77 mg/dL (ref 65–99)
Potassium: 3.7 mmol/L (ref 3.5–5.1)
Sodium: 135 mmol/L (ref 135–145)
TOTAL PROTEIN: 6.7 g/dL (ref 6.5–8.1)

## 2017-01-30 LAB — PROTEIN / CREATININE RATIO, URINE
Creatinine, Urine: 125 mg/dL
Protein Creatinine Ratio: 0.23 mg/mg{Cre} — ABNORMAL HIGH (ref 0.00–0.15)
Total Protein, Urine: 29 mg/dL

## 2017-01-30 LAB — ABO/RH: ABO/RH(D): O POS

## 2017-01-30 LAB — TYPE AND SCREEN
ABO/RH(D): O POS
Antibody Screen: NEGATIVE

## 2017-01-30 LAB — RPR: RPR Ser Ql: NONREACTIVE

## 2017-01-30 LAB — POCT FERN TEST: POCT Fern Test: POSITIVE

## 2017-01-30 MED ORDER — METHYLERGONOVINE MALEATE 0.2 MG PO TABS: 0.2000 mg | ORAL_TABLET | ORAL | Status: DC | PRN

## 2017-01-30 MED ORDER — PHENYLEPHRINE 40 MCG/ML (10ML) SYRINGE FOR IV PUSH (FOR BLOOD PRESSURE SUPPORT): 80.0000 ug | PREFILLED_SYRINGE | INTRAVENOUS | Status: DC | PRN

## 2017-01-30 MED ORDER — SOD CITRATE-CITRIC ACID 500-334 MG/5ML PO SOLN: 30.0000 mL | ORAL | Status: DC | PRN

## 2017-01-30 MED ORDER — LACTATED RINGERS IV SOLN
500.0000 mL | Freq: Once | INTRAVENOUS | Status: AC
  Administered 2017-01-30: 500 mL via INTRAVENOUS

## 2017-01-30 MED ORDER — TETANUS-DIPHTH-ACELL PERTUSSIS 5-2.5-18.5 LF-MCG/0.5 IM SUSP
0.5000 mL | Freq: Once | INTRAMUSCULAR | Status: AC
  Administered 2017-02-01: 0.5 mL via INTRAMUSCULAR
  Filled 2017-01-30: qty 0.5

## 2017-01-30 MED ORDER — PHENYLEPHRINE 40 MCG/ML (10ML) SYRINGE FOR IV PUSH (FOR BLOOD PRESSURE SUPPORT)
80.0000 ug | PREFILLED_SYRINGE | INTRAVENOUS | Status: DC | PRN
  Filled 2017-01-30: qty 10
  Filled 2017-01-30: qty 5

## 2017-01-30 MED ORDER — TERBUTALINE SULFATE 1 MG/ML IJ SOLN
0.2500 mg | Freq: Once | INTRAMUSCULAR | Status: DC | PRN
  Filled 2017-01-30: qty 1

## 2017-01-30 MED ORDER — IBUPROFEN 600 MG PO TABS
600.0000 mg | ORAL_TABLET | Freq: Four times a day (QID) | ORAL | Status: DC
  Administered 2017-01-30 – 2017-02-01 (×7): 600 mg via ORAL
  Filled 2017-01-30 (×7): qty 1

## 2017-01-30 MED ORDER — OXYCODONE-ACETAMINOPHEN 5-325 MG PO TABS: 2.0000 | ORAL_TABLET | ORAL | Status: DC | PRN

## 2017-01-30 MED ORDER — LACTATED RINGERS IV SOLN: 100.0000 mL/h | INTRAVENOUS | Status: DC

## 2017-01-30 MED ORDER — WITCH HAZEL-GLYCERIN EX PADS: 1.0000 "application " | MEDICATED_PAD | CUTANEOUS | Status: DC | PRN

## 2017-01-30 MED ORDER — MISOPROSTOL 50MCG HALF TABLET
50.0000 ug | ORAL_TABLET | ORAL | Status: DC
  Administered 2017-01-30: 50 ug via BUCCAL
  Filled 2017-01-30 (×5): qty 1

## 2017-01-30 MED ORDER — EPHEDRINE 5 MG/ML INJ
10.0000 mg | INTRAVENOUS | Status: DC | PRN
  Filled 2017-01-30: qty 2

## 2017-01-30 MED ORDER — EPHEDRINE 5 MG/ML INJ: 10.0000 mg | INTRAVENOUS | Status: DC | PRN

## 2017-01-30 MED ORDER — ACETAMINOPHEN 325 MG PO TABS
650.0000 mg | ORAL_TABLET | ORAL | Status: DC | PRN
  Administered 2017-01-31: 650 mg via ORAL
  Filled 2017-01-30: qty 2

## 2017-01-30 MED ORDER — LACTATED RINGERS IV SOLN
500.0000 mL | INTRAVENOUS | Status: DC | PRN
  Administered 2017-01-30: 500 mL via INTRAVENOUS

## 2017-01-30 MED ORDER — SIMETHICONE 80 MG PO CHEW: 80.0000 mg | CHEWABLE_TABLET | ORAL | Status: DC | PRN

## 2017-01-30 MED ORDER — ONDANSETRON HCL 4 MG/2ML IJ SOLN: 4.0000 mg | INTRAMUSCULAR | Status: DC | PRN

## 2017-01-30 MED ORDER — LACTATED RINGERS IV SOLN
INTRAVENOUS | Status: DC
  Administered 2017-01-30: 08:00:00 via INTRAVENOUS

## 2017-01-30 MED ORDER — OXYTOCIN BOLUS FROM INFUSION
500.0000 mL | Freq: Once | INTRAVENOUS | Status: AC
  Administered 2017-01-30: 500 mL via INTRAVENOUS

## 2017-01-30 MED ORDER — BENZOCAINE-MENTHOL 20-0.5 % EX AERO
1.0000 "application " | INHALATION_SPRAY | CUTANEOUS | Status: DC | PRN
  Administered 2017-01-30: 1 via TOPICAL
  Filled 2017-01-30: qty 56

## 2017-01-30 MED ORDER — ACETAMINOPHEN 325 MG PO TABS: 650.0000 mg | ORAL_TABLET | ORAL | Status: DC | PRN

## 2017-01-30 MED ORDER — ONDANSETRON HCL 4 MG/2ML IJ SOLN
4.0000 mg | Freq: Four times a day (QID) | INTRAMUSCULAR | Status: DC | PRN
Start: 2017-01-30 — End: 2017-01-30

## 2017-01-30 MED ORDER — PHENYLEPHRINE 40 MCG/ML (10ML) SYRINGE FOR IV PUSH (FOR BLOOD PRESSURE SUPPORT)
80.0000 ug | PREFILLED_SYRINGE | INTRAVENOUS | Status: DC | PRN
  Filled 2017-01-30: qty 5

## 2017-01-30 MED ORDER — ZOLPIDEM TARTRATE 5 MG PO TABS: 5.0000 mg | ORAL_TABLET | Freq: Every evening | ORAL | Status: DC | PRN

## 2017-01-30 MED ORDER — DIPHENHYDRAMINE HCL 50 MG/ML IJ SOLN: 12.5000 mg | INTRAMUSCULAR | Status: DC | PRN

## 2017-01-30 MED ORDER — LIDOCAINE HCL (PF) 1 % IJ SOLN
INTRAMUSCULAR | Status: DC | PRN
  Administered 2017-01-30 (×2): 5 mL via EPIDURAL

## 2017-01-30 MED ORDER — ONDANSETRON HCL 4 MG PO TABS: 4.0000 mg | ORAL_TABLET | ORAL | Status: DC | PRN

## 2017-01-30 MED ORDER — DIBUCAINE 1 % RE OINT: 1.0000 "application " | TOPICAL_OINTMENT | RECTAL | Status: DC | PRN

## 2017-01-30 MED ORDER — COCONUT OIL OIL: 1.0000 "application " | TOPICAL_OIL | Status: DC | PRN

## 2017-01-30 MED ORDER — OXYTOCIN 40 UNITS IN LACTATED RINGERS INFUSION - SIMPLE MED
2.5000 [IU]/h | INTRAVENOUS | Status: DC
  Administered 2017-01-30: 2.5 [IU]/h via INTRAVENOUS

## 2017-01-30 MED ORDER — FENTANYL 2.5 MCG/ML BUPIVACAINE 1/10 % EPIDURAL INFUSION (WH - ANES)
14.0000 mL/h | INTRAMUSCULAR | Status: DC | PRN
  Administered 2017-01-30: 14 mL/h via EPIDURAL
  Filled 2017-01-30: qty 100

## 2017-01-30 MED ORDER — OXYCODONE-ACETAMINOPHEN 5-325 MG PO TABS: 1.0000 | ORAL_TABLET | ORAL | Status: DC | PRN

## 2017-01-30 MED ORDER — METHYLERGONOVINE MALEATE 0.2 MG/ML IJ SOLN: 0.2000 mg | INTRAMUSCULAR | Status: DC | PRN

## 2017-01-30 MED ORDER — FENTANYL CITRATE (PF) 100 MCG/2ML IJ SOLN
100.0000 ug | INTRAMUSCULAR | Status: DC | PRN
  Administered 2017-01-30: 100 ug via INTRAVENOUS
  Filled 2017-01-30: qty 2

## 2017-01-30 MED ORDER — OXYTOCIN 40 UNITS IN LACTATED RINGERS INFUSION - SIMPLE MED
1.0000 m[IU]/min | INTRAVENOUS | Status: DC
  Administered 2017-01-30: 2 m[IU]/min via INTRAVENOUS
  Administered 2017-01-30: 4 m[IU]/min via INTRAVENOUS
  Administered 2017-01-30: 8 m[IU]/min via INTRAVENOUS
  Administered 2017-01-30: 12 m[IU]/min via INTRAVENOUS
  Administered 2017-01-30: 6 m[IU]/min via INTRAVENOUS
  Administered 2017-01-30: 10 m[IU]/min via INTRAVENOUS
  Filled 2017-01-30: qty 1000

## 2017-01-30 MED ORDER — PRENATAL MULTIVITAMIN CH
1.0000 | ORAL_TABLET | Freq: Every day | ORAL | Status: DC
  Administered 2017-01-31 – 2017-02-01 (×2): 1 via ORAL
  Filled 2017-01-30 (×2): qty 1

## 2017-01-30 MED ORDER — SOD CITRATE-CITRIC ACID 500-334 MG/5ML PO SOLN: 30.0000 mL | Freq: Once | ORAL | Status: DC

## 2017-01-30 MED ORDER — SENNOSIDES-DOCUSATE SODIUM 8.6-50 MG PO TABS
2.0000 | ORAL_TABLET | ORAL | Status: DC
  Administered 2017-01-30 – 2017-02-01 (×2): 2 via ORAL
  Filled 2017-01-30 (×2): qty 2

## 2017-01-30 MED ORDER — DIPHENHYDRAMINE HCL 25 MG PO CAPS: 25.0000 mg | ORAL_CAPSULE | Freq: Four times a day (QID) | ORAL | Status: DC | PRN

## 2017-01-30 MED ORDER — LIDOCAINE HCL (PF) 1 % IJ SOLN
30.0000 mL | INTRAMUSCULAR | Status: DC | PRN
  Filled 2017-01-30: qty 30

## 2017-01-30 NOTE — MAU Note (Signed)
Pt thinks her water broke around 0430 this morning. Denies bleeding. +FM.

## 2017-01-30 NOTE — Anesthesia Procedure Notes (Addendum)
Epidural Patient location during procedure: OB Start time: 01/30/2017 1:40 PM End time: 01/30/2017 1:50 PM  Staffing Anesthesiologist: Janeece Riggers, MD  Preanesthetic Checklist Completed: patient identified, site marked, surgical consent, pre-op evaluation, timeout performed, IV checked, risks and benefits discussed and monitors and equipment checked  Epidural Patient position: sitting Prep: site prepped and draped and DuraPrep Patient monitoring: continuous pulse ox and blood pressure Approach: midline Location: L3-L4 Injection technique: LOR air  Needle:  Needle type: Tuohy  Needle gauge: 17 G Needle length: 9 cm and 9 Needle insertion depth: 4 cm Catheter type: closed end flexible Catheter size: 19 Gauge Catheter at skin depth: 10 cm Test dose: negative  Assessment Events: blood not aspirated, injection not painful, no injection resistance, negative IV test and no paresthesia

## 2017-01-30 NOTE — Progress Notes (Signed)
Labor Progress Note  Allison Prince is a 27 y.o. G1P0 at [redacted]w[redacted]d  admitted for rupture of membranes  S: Patient comfortable. No concerns voiced.   O:  BP 119/75   Pulse 76   Temp 97.6 F (36.4 C) (Oral)   Resp 20   Ht 5\' 2"  (1.575 m)   Wt 67.7 kg (149 lb 4.8 oz)   LMP 03/30/2016 (Approximate)   SpO2 100%   BMI 27.31 kg/m   No intake/output data recorded.  FHT:  FHR: 155 bpm, variability: moderate,  accelerations:  Present,  decelerations:  Absent UC:   regular, every 2-4 minutes SVE:   Dilation: 4 Effacement (%): 70 Station: -1 Exam by:: katherine g jones RN  SROM light mec ~0330a  Pitocin @ 8 mu/min  Labs: Lab Results  Component Value Date   WBC 8.8 01/30/2017   HGB 13.2 01/30/2017   HCT 39.1 01/30/2017   MCV 85.4 01/30/2017   PLT 258 01/30/2017    Assessment / Plan: 27 y.o. G1P0 [redacted]w[redacted]d in early labor Augmentation of labor, progressing well  Labor: Progressing normally on pitocin Fetal Wellbeing:  Category I Pain Control:  Epidural Anticipated MOD:  NSVD  Expectant management   Luiz Blare, DO OB Fellow

## 2017-01-30 NOTE — H&P (Signed)
Allison Prince is a 27 y.o. female G1 @ 39.3wks by 6wk scan presenting for leaking fluid since 0330. Denies painful ctx, bldg, H/A, visual disturbances, or RUQ pain. Her preg has been followed by the [facility] service and has been remarkable for 1) smoker 2) multiple small fibroids 3) gHTN noted on admission to MAU  OB History    Gravida Para Term Preterm AB Living   1             SAB TAB Ectopic Multiple Live Births                 Past Medical History:  Diagnosis Date  . Allergy   . Fibroid   . Medical history non-contributory    Past Surgical History:  Procedure Laterality Date  . NO PAST SURGERIES    . tooth pulled     Family History: family history includes Arthritis in her paternal grandfather; Asthma in her brother; COPD in her mother; Depression in her mother; Diabetes in her mother; Hyperlipidemia in her mother; Hypertension in her mother. Social History:  reports that she has been smoking cigarettes.  She has a 1.50 pack-year smoking history. she has never used smokeless tobacco. She reports that she does not drink alcohol or use drugs.     Maternal Diabetes: No Genetic Screening: Normal Maternal Ultrasounds/Referrals: Normal Fetal Ultrasounds or other Referrals:  None Maternal Substance Abuse:  Yes:  Type: Smoker Significant Maternal Medications:  None Significant Maternal Lab Results:  Lab values include: Group B Strep negative Other Comments:  None  ROS History Dilation: 2.5 Effacement (%): 60 Station: -2 Exam by:: katherine g jones RN  Blood pressure (!) 141/97, pulse 85, temperature 98.2 F (36.8 C), temperature source Oral, resp. rate 20, height 5\' 2"  (1.575 m), weight 67.7 kg (149 lb 4.8 oz), last menstrual period 03/30/2016. Exam Physical Exam  Constitutional: She is oriented to person, place, and time. She appears well-developed.  HENT:  Head: Normocephalic.  Neck: Normal range of motion.  Cardiovascular: Normal rate.  Respiratory: Effort  normal.  GI:  EFM 140s, +accels, no decels Ctx irreg, mild q 2-5 mins  Musculoskeletal: Normal range of motion.  Neurological: She is alert and oriented to person, place, and time.  Skin: Skin is warm and dry.  Psychiatric: She has a normal mood and affect. Her behavior is normal. Thought content normal.    Prenatal labs: ABO, Rh: --/--/O POS (01/04 7846) Antibody: NEG (01/04 0748) Rubella: 8.35 (05/30 1536) RPR: Non Reactive (10/08 0845)  HBsAg: Negative (05/30 1536)  HIV: Non Reactive (10/08 0845)  GBS: Negative (12/21 0000)   Assessment/Plan: IUP@term  PROM Cx favorable gHTN  Admit to [facility] cx ripening with oral cytotec x 1 followed by Pit in 4h Watch BPs- none in severe range and no s/s pre-e Anticipate SVD   Hershel Corkery CNM 01/30/2017, 10:11 AM

## 2017-01-30 NOTE — Anesthesia Preprocedure Evaluation (Signed)
Anesthesia Evaluation  Patient identified by MRN, date of birth, ID band Patient awake    Reviewed: Allergy & Precautions, H&P , NPO status , Patient's Chart, lab work & pertinent test results, reviewed documented beta blocker date and time   Airway Mallampati: II  TM Distance: >3 FB Neck ROM: full    Dental no notable dental hx.    Pulmonary neg pulmonary ROS, Current Smoker,    Pulmonary exam normal breath sounds clear to auscultation       Cardiovascular negative cardio ROS Normal cardiovascular exam Rhythm:regular Rate:Normal     Neuro/Psych negative neurological ROS  negative psych ROS   GI/Hepatic negative GI ROS, Neg liver ROS,   Endo/Other  negative endocrine ROS  Renal/GU negative Renal ROS  negative genitourinary   Musculoskeletal   Abdominal   Peds  Hematology negative hematology ROS (+)   Anesthesia Other Findings   Reproductive/Obstetrics (+) Pregnancy                             Anesthesia Physical Anesthesia Plan  ASA: II  Anesthesia Plan: Epidural   Post-op Pain Management:    Induction:   PONV Risk Score and Plan:   Airway Management Planned:   Additional Equipment:   Intra-op Plan:   Post-operative Plan:   Informed Consent: I have reviewed the patients History and Physical, chart, labs and discussed the procedure including the risks, benefits and alternatives for the proposed anesthesia with the patient or authorized representative who has indicated his/her understanding and acceptance.     Plan Discussed with:   Anesthesia Plan Comments:         Anesthesia Quick Evaluation

## 2017-01-30 NOTE — Anesthesia Pain Management Evaluation Note (Signed)
  [facility] Pain Management Visit Note  Patient: Allison Prince, 27 y.o., female  "Hello I am a member of the anesthesia team at [facility]. We have an anesthesia team available at all times to provide care throughout the hospital, including epidural management and anesthesia for C-section. I don't know your plan for the delivery whether it a natural birth, water birth, IV sedation, nitrous supplementation, doula or epidural, but we want to meet your pain goals."   1.Was your pain managed to your expectations on prior hospitalizations?   No prior hospitalizations  2.What is your expectation for pain management during this hospitalization?     Will try natural and get epidural if needed.  3.How can we help you reach that goal? Epidural if desired.  Record the patient's initial score and the patient's pain goal.   Pain: 2  Pain Goal: 5 [facility] wants you to be able to say your pain was always managed very well.  Lorana Maffeo 01/30/2017

## 2017-01-31 LAB — CBC
HCT: 35.1 % — ABNORMAL LOW (ref 36.0–46.0)
Hemoglobin: 11.7 g/dL — ABNORMAL LOW (ref 12.0–15.0)
MCH: 28.5 pg (ref 26.0–34.0)
MCHC: 33.3 g/dL (ref 30.0–36.0)
MCV: 85.4 fL (ref 78.0–100.0)
PLATELETS: 222 10*3/uL (ref 150–400)
RBC: 4.11 MIL/uL (ref 3.87–5.11)
RDW: 14.7 % (ref 11.5–15.5)
WBC: 16.7 10*3/uL — AB (ref 4.0–10.5)

## 2017-01-31 NOTE — Anesthesia Postprocedure Evaluation (Signed)
Anesthesia Post Note  Patient: Allison Prince  Procedure(s) Performed: AN AD HOC LABOR EPIDURAL     Patient location during evaluation: Mother Baby Anesthesia Type: Epidural Level of consciousness: awake and alert Pain management: pain level controlled Vital Signs Assessment: post-procedure vital signs reviewed and stable Respiratory status: spontaneous breathing, nonlabored ventilation and respiratory function stable Cardiovascular status: stable Postop Assessment: no headache, no backache, epidural receding and patient able to bend at knees Anesthetic complications: no    Last Vitals:  Vitals:   01/31/17 0213 01/31/17 0508  BP: 124/74 127/76  Pulse: 76 66  Resp: 18 16  Temp: 37.2 C 37.1 C  SpO2: 99% 99%    Last Pain:  Vitals:   01/31/17 0609  TempSrc:   PainSc: Asleep   Pain Goal:                 Jabier Mutton

## 2017-01-31 NOTE — Progress Notes (Signed)
Post Partum Day 1, SVD @1830  on 01/31/16 Subjective: no complaints, up ad lib, tolerating PO and + flatus. Patient reports history of fibroids  Objective: Blood pressure 112/70, pulse 64, temperature 98.5 F (36.9 C), temperature source Oral, resp. rate 18, height 5\' 2"  (1.575 m), weight 67.7 kg (149 lb 4.8 oz), last menstrual period 03/30/2016, SpO2 99 %, unknown if currently breastfeeding.  Physical Exam:  General: alert, cooperative and no distress Lochia: appropriate Uterine Fundus: firm, palpable nodules felt at top of fundus  DVT Evaluation: No evidence of DVT seen on physical exam. Negative Homan's sign. No cords or calf tenderness. No significant calf/ankle edema.  Recent Labs    01/30/17 0748 01/31/17 0523  HGB 13.2 11.7*  HCT 39.1 35.1*    Assessment/Plan: Plan for discharge tomorrow, Breastfeeding and Contraception Nexplanon   LOS: 1 day   [name] 01/31/2017, 11:41 AM

## 2017-01-31 NOTE — Progress Notes (Signed)
R forearm NSL is removed Cath is intact, site is clean dry and intact. Gause dressing is applied

## 2017-01-31 NOTE — Lactation Note (Signed)
This note was copied from a baby's chart. Lactation Consultation Note  Patient Name: Allison Prince [id number] Date: 01/31/2017 Reason for consult: Initial assessment  22 hours FT 5-14 lb female who is now being partially BF and formula fed by her mother. Mom asked questions about how to supplement with formula but reassure her that her milk was coming in soon. Baby latched on with very little help but no swallows were heard, RN had already left a curved syringe to supplement baby with 5-10 ml of formula if needed per mom's request; baby took 5 ml at 4:15 pm. Mom said baby didn't like formula with the syringe so encourage mom to keep trying BF and putting baby on the breast skin to skin. Reviewed BF resources, mom is aware that she can schedule an outpatient appt and join the BF support group on Tuesday.  Maternal Data Does the patient have breastfeeding experience prior to this delivery?: No  Feeding Feeding Type: Breast Fed  LATCH Score Latch: Grasps breast easily, tongue down, lips flanged, rhythmical sucking.  Audible Swallowing: None  Type of Nipple: Everted at rest and after stimulation  Comfort (Breast/Nipple): Soft / non-tender  Hold (Positioning): Assistance needed to correctly position infant at breast and maintain latch.  LATCH Score: 7  Interventions Interventions: Breast feeding basics reviewed;Assisted with latch;Skin to skin;Breast massage;Breast compression;Support pillows;Position options;Adjust position  Lactation Tools Discussed/Used     Consult Status Consult Status: Follow-up Date: 02/01/17    Lenise Herald 01/31/2017, 4:53 PM

## 2017-02-01 MED ORDER — IBUPROFEN 600 MG PO TABS: 600.0000 mg | ORAL_TABLET | Freq: Four times a day (QID) | ORAL | 0 refills | Status: DC

## 2017-02-01 MED ORDER — SENNOSIDES-DOCUSATE SODIUM 8.6-50 MG PO TABS: 2.0000 | ORAL_TABLET | ORAL | 0 refills | Status: DC

## 2017-02-01 NOTE — Discharge Instructions (Signed)
Please follow up with your PCP for your right leg edema, this should go away with continued walking and diuresis.   Home Care Instructions for Mom ACTIVITY  Gradually return to your regular activities.  Let yourself rest. Nap while your baby sleeps.  Avoid lifting anything that is heavier than 10 lb (4.5 kg) until your health care provider says it is okay.  Avoid activities that take a lot of effort and energy (are strenuous) until approved by your health care provider. Walking at a slow-to-moderate pace is usually safe.  If you had a cesarean delivery: ? Do not vacuum, climb stairs, or drive a car for 4-6 weeks. ? Have someone help you at home until you feel like you can do your usual activities yourself. ? Do exercises as told by your health care provider, if this applies.  VAGINAL BLEEDING You may continue to bleed for 4-6 weeks after delivery. Over time, the amount of blood usually decreases and the color of the blood usually gets lighter. However, the flow of bright red blood may increase if you have been too active. If you need to use more than one pad in an hour because your pad gets soaked, or if you pass a large clot:  Lie down.  Raise your feet.  Place a cold compress on your lower abdomen.  Rest.  Call your health care provider.  If you are breastfeeding, your period should return anytime between 8 weeks after delivery and the time that you stop breastfeeding. If you are not breastfeeding, your period should return 6-8 weeks after delivery. PERINEAL CARE The perineal area, or perineum, is the part of your body between your thighs. After delivery, this area needs special care. Follow these instructions as told by your health care provider.  Take warm tub baths for 15-20 minutes.  Use medicated pads and pain-relieving sprays and creams as told.  Do not use tampons or douches until vaginal bleeding has stopped.  Each time you go to the bathroom: ? Use a peri  bottle. ? Change your pad. ? Use towelettes in place of toilet paper until your stitches have healed.  Do Kegel exercises every day. Kegel exercises help to maintain the muscles that support the vagina, bladder, and bowels. You can do these exercises while you are standing, sitting, or lying down. To do Kegel exercises: ? Tighten the muscles of your abdomen and the muscles that surround your birth canal. ? Hold for a few seconds. ? Relax. ? Repeat until you have done this 5 times in a row.  To prevent hemorrhoids from developing or getting worse: ? Drink enough fluid to keep your urine clear or pale yellow. ? Avoid straining when having a bowel movement. ? Take over-the-counter medicines and stool softeners as told by your health care provider.  BREAST CARE  Wear a tight-fitting bra.  Avoid taking over-the-counter pain medicine for breast discomfort.  Apply ice to the breasts to help with discomfort as needed: ? Put ice in a plastic bag. ? Place a towel between your skin and the bag. ? Leave the ice on for 20 minutes or as told by your health care provider.  NUTRITION  Eat a well-balanced diet.  Do not try to lose weight quickly by cutting back on calories.  Take your prenatal vitamins until your postpartum checkup or until your health care provider tells you to stop.  POSTPARTUM DEPRESSION You may find yourself crying for no apparent reason and unable to cope with  all of the changes that come with having a newborn. This mood is called postpartum depression. Postpartum depression happens because your hormone levels change after delivery. If you have postpartum depression, get support from your partner, friends, and family. If the depression does not go away on its own after several weeks, contact your health care provider. BREAST SELF-EXAM Do a breast self-exam each month, at the same time of the month. If you are breastfeeding, check your breasts just after a feeding, when your  breasts are less full. If you are breastfeeding and your period has started, check your breasts on day 5, 6, or 7 of your period. Report any lumps, bumps, or discharge to your health care provider. Know that breasts are normally lumpy if you are breastfeeding. This is temporary, and it is not a health risk. INTIMACY AND SEXUALITY Avoid sexual activity for at least 3-4 weeks after delivery or until the brownish-red vaginal flow is completely gone. If you want to avoid pregnancy, use some form of birth control. You can get pregnant after delivery, even if you have not had your period. SEEK MEDICAL CARE IF:  You feel unable to cope with the changes that a child brings to your life, and these feelings do not go away after several weeks.  You notice a lump, a bump, or discharge on your breast.  SEEK IMMEDIATE MEDICAL CARE IF:  Blood soaks your pad in 1 hour or less.  You have: ? Severe pain or cramping in your lower abdomen. ? A bad-smelling vaginal discharge. ? A fever that is not controlled by medicine. ? A fever, and an area of your breast is red and sore. ? Pain or redness in your calf. ? Sudden, severe chest pain. ? Shortness of breath. ? Painful or bloody urination. ? Problems with your vision.  You vomit for 12 hours or longer.  You develop a severe headache.  You have serious thoughts about hurting yourself, your child, or anyone else.  This information is not intended to replace advice given to you by your health care provider. Make sure you discuss any questions you have with your health care provider. Document Released: 01/11/2000 Document Revised: 06/21/2015 Document Reviewed: 07/17/2014 Elsevier Interactive Patient Education  2017 [employer].

## 2017-02-01 NOTE — Discharge Summary (Signed)
OB Discharge Summary     Patient Name: Allison Prince DOB: Aug 13, 1990 MRN: [id number]  Date of admission: 01/30/2017 Delivering MD: Luiz Blare Y   Date of discharge: 02/01/2017  Admitting diagnosis: 63 WEEKS ROM Intrauterine pregnancy: [redacted]w[redacted]d     Secondary diagnosis:  Principal Problem:   Supervision of normal first pregnancy Active Problems:   Uterine fibroid during pregnancy, antepartum   Gestational hypertension w/o significant proteinuria in 3rd trimester   Vaginal delivery  Additional problems: smoker, multiple small fibroids, GHTN on admission to [facility]      Discharge diagnosis: Term Pregnancy Delivered                                                                                                Post partum procedures:none  Augmentation: Pitocin and Cytotec  Complications: None  Hospital course:  Induction of Labor With Vaginal Delivery   27 y.o. yo G1P1001 at [redacted]w[redacted]d was admitted to the hospital 01/30/2017 for induction of labor.  Indication for induction: Gestational hypertension.  Patient had an uncomplicated labor course as follows: Membrane Rupture Time/Date: 4:30 AM ,01/30/2017   Intrapartum Procedures: Episiotomy: None [1]                                         Lacerations:  None [1]  Patient had delivery of a Viable infant.  Information for the patient's newborn:  Dynisha, Due Girl Tiarrah [[id number]]  Delivery Method: Vag-Spont   01/30/2017  Details of delivery can be found in separate delivery note.  Patient had a routine postpartum course. Patient is discharged home 02/01/17.  Physical exam  Vitals:   01/31/17 0508 01/31/17 1030 01/31/17 1712 01/31/17 1714  BP: 127/76 112/70 (!) 138/92 133/79  Pulse: 66 64 70 68  Resp: 16 18 20    Temp: 98.7 F (37.1 C) 98.5 F (36.9 C) 98.3 F (36.8 C)   TempSrc: Oral Oral Axillary   SpO2: 99%  100%   Weight:      Height:       General: alert, cooperative and no distress Lochia: appropriate Uterine Fundus:  firm Incision: N/A DVT Evaluation: No evidence of DVT seen on physical exam. Negative Homan's sign. No cords or calf tenderness. Calf/Ankle edema is present Labs: Lab Results  Component Value Date   WBC 16.7 (H) 01/31/2017   HGB 11.7 (L) 01/31/2017   HCT 35.1 (L) 01/31/2017   MCV 85.4 01/31/2017   PLT 222 01/31/2017   CMP Latest Ref Rng & Units 01/30/2017  Glucose 65 - 99 mg/dL 77  BUN 6 - 20 mg/dL 8  Creatinine 0.44 - 1.00 mg/dL 0.49  Sodium 135 - 145 mmol/L 135  Potassium 3.5 - 5.1 mmol/L 3.7  Chloride 101 - 111 mmol/L 105  CO2 22 - 32 mmol/L 18(L)  Calcium 8.9 - 10.3 mg/dL 9.1  Total Protein 6.5 - 8.1 g/dL 6.7  Total Bilirubin 0.3 - 1.2 mg/dL 0.7  Alkaline Phos 38 - 126 U/L 173(H)  AST 15 - 41 U/L  18  ALT 14 - 54 U/L 15    Discharge instruction: per After Visit Summary and "Baby and Me Booklet".  After visit meds:  Allergies as of 02/01/2017      Reactions   Latex Itching, Rash   Asa [aspirin] Nausea And Vomiting      Medication List    STOP taking these medications   ferrous sulfate 325 (65 FE) MG tablet     TAKE these medications   ibuprofen 600 MG tablet Commonly known as:  ADVIL,MOTRIN Take 1 tablet (600 mg total) by mouth every 6 (six) hours.   Prenatal Vitamin 27-0.8 MG Tabs Take 1 tablet by mouth daily.   senna-docusate 8.6-50 MG tablet Commonly known as:  Senokot-S Take 2 tablets by mouth daily. Start taking on:  02/02/2017       Diet: routine diet  Activity: Advance as tolerated. Pelvic rest for 6 weeks.   Outpatient follow up:4 weeks Follow up Appt:No future appointments. Follow up Visit:No Follow-up on file. Follow-up Information    [facility]. Schedule an appointment as soon as possible for a visit.   Why:  Follow up in 4 weeks Contact information: [place] [id number] [phone]          Postpartum contraception: Nexplanon  Newborn Data: Live born female  Birth Weight: 5 lb 15.2  oz (2700 g) APGAR: 8, 9  Newborn Delivery   Birth date/time:  01/30/2017 18:46:00 Delivery type:  Vaginal, Spontaneous     Baby Feeding: Bottle and Breast Disposition:home with mother   02/01/2017 Debborah Alonge More, DO   I confirm that I have verified the information documented in the resident's note and that I have also personally reperformed the physical exam and all medical decision making activities.  Wende Mott, CNM 02/02/17 8:10 PM

## 2017-02-01 NOTE — Lactation Note (Signed)
This note was copied from a baby's chart. Lactation Consultation Note  Patient Name: Allison Prince MVEHM'C Date: 02/01/2017 Reason for consult: Follow-up assessment;1st time breastfeeding  Follow up visit at 102 hours of age.  Mom reports using formula in recent bottle feeding and latches baby some. Mom plans to try to offer breastmilk to baby for about 1 month and plans to get formula from [facility].  Mom has called insurance for DEBP but has not received it yet.  LC provided mom with hand pump with instructions.  LC encouraged mom to feed baby on demand or increase formula supplement with increasing volumes.  LC explained supply and demand with breast milk. Discussed milk transitioning to larger volume, engorgement care discussed.  Encouraged frequent feedings. Mom to soften breast as needed prior to latch.      Maternal Data Has patient been taught Hand Expression?: Yes Does the patient have breastfeeding experience prior to this delivery?: No  Feeding Feeding Type: Breast Fed Length of feed: 8 min  LATCH Score Latch: Grasps breast easily, tongue down, lips flanged, rhythmical sucking.  Audible Swallowing: A few with stimulation  Type of Nipple: Everted at rest and after stimulation  Comfort (Breast/Nipple): Soft / non-tender  Hold (Positioning): No assistance needed to correctly position infant at breast.  LATCH Score: 9  Interventions Interventions: Breast feeding basics reviewed  Lactation Tools Discussed/Used Pump Review: Setup, frequency, and cleaning Initiated by:: JS IBCLC Date initiated:: 02/01/17   Consult Status Consult Status: Complete    Jana Shoptaw 02/01/2017, 11:35 AM

## 2017-02-06 DIAGNOSIS — Z029 Encounter for administrative examinations, unspecified: Secondary | ICD-10-CM

## 2017-02-27 ENCOUNTER — Encounter: Payer: Self-pay | Admitting: Women's Health

## 2017-02-27 ENCOUNTER — Ambulatory Visit (INDEPENDENT_AMBULATORY_CARE_PROVIDER_SITE_OTHER): Payer: Managed Care, Other (non HMO) | Admitting: Women's Health

## 2017-02-27 DIAGNOSIS — O165 Unspecified maternal hypertension, complicating the puerperium: Secondary | ICD-10-CM | POA: Insufficient documentation

## 2017-02-27 DIAGNOSIS — Z3202 Encounter for pregnancy test, result negative: Secondary | ICD-10-CM

## 2017-02-27 LAB — POCT URINE PREGNANCY: Preg Test, Ur: NEGATIVE

## 2017-02-27 MED ORDER — AMLODIPINE BESYLATE 5 MG PO TABS
5.0000 mg | ORAL_TABLET | Freq: Every day | ORAL | 0 refills | Status: DC
Start: 2017-02-27 — End: 2017-11-10

## 2017-02-27 NOTE — Progress Notes (Signed)
POSTPARTUM VISIT Patient name: Allison Prince MRN [id number]  Date of birth: 10-12-1990 Chief Complaint:   Postpartum Care (wantbirth control - pill)  History of Present Illness:   Allison Prince is a 27 y.o. G43P1001 African American female being seen today for a postpartum visit. She is 4 weeks postpartum following a spontaneous vaginal delivery at 39.3 gestational weeks after IOL d/t PROM and GHTN dx in [facility]. Anesthesia: epidural. I have fully reviewed the prenatal and intrapartum course. Pregnancy complicated by GHTN dx in [facility] . She was not d/c'd on meds, and not scheduled for 1wk bp check. Denies ha, visual changes, ruq/epigastric pain, n/v.   Postpartum course has been uncomplicated. Bleeding no bleeding. Bowel function is normal. Bladder function is normal.  Patient is not sexually active. Last sexual activity: prior to birth of baby.  Contraception method is wants coc's.  [place] Postpartum Depression Screening: negative. Score 4.   Last pap 02/08/14.  Results were normal .  No LMP recorded.  Baby's course has been uncomplicated. Baby is feeding by bottle.  Review of Systems:   Pertinent items are noted in HPI Denies Abnormal vaginal discharge w/ itching/odor/irritation, headaches, visual changes, shortness of breath, chest pain, abdominal pain, severe nausea/vomiting, or problems with urination or bowel movements. Pertinent History Reviewed:  Reviewed past medical,surgical, obstetrical and family history.  Reviewed problem list, medications and allergies. OB History  Gravida Para Term Preterm AB Living  1 1 1     1   SAB TAB Ectopic Multiple Live Births        0 1    # Outcome Date GA Lbr Len/2nd Weight Sex Delivery Anes PTL Lv  1 Term 01/30/17 [redacted]w[redacted]d 04:32 / 01:14 5 lb 15.2 oz (2.7 kg) F Vag-Spont EPI  LIV     Physical Assessment:   Vitals:   02/27/17 1105  BP: 140/90  Pulse: 98  Weight: 130 lb (59 kg)  Height: 5\' 2"  (1.575 m)  Body mass index is 23.78  kg/m.       Physical Examination:   General appearance: alert, well appearing, and in no distress  Mental status: alert, oriented to person, place, and time  Skin: warm & dry   Cardiovascular: normal heart rate noted   Respiratory: normal respiratory effort, no distress   Breasts: deferred, no complaints   Abdomen: soft, non-tender   Pelvic: VULVA: normal appearing vulva with no masses, tenderness or lesions, UTERUS: uterus is normal size, shape, consistency and nontender  Rectal: no hemorrhoids  Extremities: no edema       Results for orders placed or performed in visit on 02/27/17 (from the past 24 hour(s))  POCT urine pregnancy   Collection Time: 02/27/17 11:12 AM  Result Value Ref Range   Preg Test, Ur Negative Negative    Assessment & Plan:  1) Postpartum exam 2) 4 wks s/p SVB after IOL for PROM, GHTN 3) Bottlefeeding 4) Depression screening 5) Contraception counseling, pt prefers COCs, however her bp is elevated today. Discussed all options, wants IUD  6) PP HTN> rx norvasc 5mg  daily, stop 2d prior to next appt 7) Due for pap> will get in 2wks prior to IUD  Meds:  Meds ordered this encounter  Medications  . amLODipine (NORVASC) 5 MG tablet    Sig: Take 1 tablet (5 mg total) by mouth daily.    Dispense:  30 tablet    Refill:  0    Order Specific Question:   Supervising Provider  Answer:   Florian Buff [[id number]]    Follow-up: Return in about 2 weeks (around 03/13/2017) for Pap & physical, then 1wk later for IUD insertion.   Orders Placed This Encounter  Procedures  . POCT urine pregnancy    Tawnya Crook CNM, [facility] 02/27/2017 11:35 AM

## 2017-02-27 NOTE — Patient Instructions (Addendum)
Stop taking the norvasc (amlodipine) 2 days before your next appointment  NO SEX UNTIL AFTER YOU GET YOUR BIRTH CONTROL   Levonorgestrel intrauterine device (IUD) What is this medicine? LEVONORGESTREL IUD (LEE voe nor jes trel) is a contraceptive (birth control) device. The device is placed inside the uterus by a healthcare professional. It is used to prevent pregnancy. This device can also be used to treat heavy bleeding that occurs during your period. This medicine may be used for other purposes; ask your health care provider or pharmacist if you have questions. COMMON BRAND NAME(S): Minette Headland What should I tell my health care provider before I take this medicine? They need to know if you have any of these conditions: -abnormal Pap smear -cancer of the breast, uterus, or cervix -diabetes -endometritis -genital or pelvic infection now or in the past -have more than one sexual partner or your partner has more than one partner -heart disease -history of an ectopic or tubal pregnancy -immune system problems -IUD in place -liver disease or tumor -problems with blood clots or take blood-thinners -seizures -use intravenous drugs -uterus of unusual shape -vaginal bleeding that has not been explained -an unusual or allergic reaction to levonorgestrel, other hormones, silicone, or polyethylene, medicines, foods, dyes, or preservatives -pregnant or trying to get pregnant -breast-feeding How should I use this medicine? This device is placed inside the uterus by a health care professional. Talk to your pediatrician regarding the use of this medicine in children. Special care may be needed. Overdosage: If you think you have taken too much of this medicine contact a poison control center or emergency room at once. NOTE: This medicine is only for you. Do not share this medicine with others. What if I miss a dose? This does not apply. Depending on the brand of device you  have inserted, the device will need to be replaced every 3 to 5 years if you wish to continue using this type of birth control. What may interact with this medicine? Do not take this medicine with any of the following medications: -amprenavir -bosentan -fosamprenavir This medicine may also interact with the following medications: -aprepitant -armodafinil -barbiturate medicines for inducing sleep or treating seizures -bexarotene -boceprevir -griseofulvin -medicines to treat seizures like carbamazepine, ethotoin, felbamate, oxcarbazepine, phenytoin, topiramate -modafinil -pioglitazone -rifabutin -rifampin -rifapentine -some medicines to treat HIV infection like atazanavir, efavirenz, indinavir, lopinavir, nelfinavir, tipranavir, ritonavir -St. John's wort -warfarin This list may not describe all possible interactions. Give your health care provider a list of all the medicines, herbs, non-prescription drugs, or dietary supplements you use. Also tell them if you smoke, drink alcohol, or use illegal drugs. Some items may interact with your medicine. What should I watch for while using this medicine? Visit your doctor or health care professional for regular check ups. See your doctor if you or your partner has sexual contact with others, becomes HIV positive, or gets a sexual transmitted disease. This product does not protect you against HIV infection (AIDS) or other sexually transmitted diseases. You can check the placement of the IUD yourself by reaching up to the top of your vagina with clean fingers to feel the threads. Do not pull on the threads. It is a good habit to check placement after each menstrual period. Call your doctor right away if you feel more of the IUD than just the threads or if you cannot feel the threads at all. The IUD may come out by itself. You may become pregnant if the  device comes out. If you notice that the IUD has come out use a backup birth control method like  condoms and call your health care provider. Using tampons will not change the position of the IUD and are okay to use during your period. This IUD can be safely scanned with magnetic resonance imaging (MRI) only under specific conditions. Before you have an MRI, tell your healthcare provider that you have an IUD in place, and which type of IUD you have in place. What side effects may I notice from receiving this medicine? Side effects that you should report to your doctor or health care professional as soon as possible: -allergic reactions like skin rash, itching or hives, swelling of the face, lips, or tongue -fever, flu-like symptoms -genital sores -high blood pressure -no menstrual period for 6 weeks during use -pain, swelling, warmth in the leg -pelvic pain or tenderness -severe or sudden headache -signs of pregnancy -stomach cramping -sudden shortness of breath -trouble with balance, talking, or walking -unusual vaginal bleeding, discharge -yellowing of the eyes or skin Side effects that usually do not require medical attention (report to your doctor or health care professional if they continue or are bothersome): -acne -breast pain -change in sex drive or performance -changes in weight -cramping, dizziness, or faintness while the device is being inserted -headache -irregular menstrual bleeding within first 3 to 6 months of use -nausea This list may not describe all possible side effects. Call your doctor for medical advice about side effects. You may report side effects to FDA at 1-800-FDA-1088. Where should I keep my medicine? This does not apply. NOTE: This sheet is a summary. It may not cover all possible information. If you have questions about this medicine, talk to your doctor, pharmacist, or health care provider.  2018 Elsevier/Gold Standard (2015-10-26 14:14:56)

## 2017-03-13 ENCOUNTER — Ambulatory Visit (INDEPENDENT_AMBULATORY_CARE_PROVIDER_SITE_OTHER): Payer: Managed Care, Other (non HMO) | Admitting: Women's Health

## 2017-03-13 ENCOUNTER — Encounter: Payer: Self-pay | Admitting: Women's Health

## 2017-03-13 ENCOUNTER — Other Ambulatory Visit (HOSPITAL_COMMUNITY)
Admission: RE | Admit: 2017-03-13 | Discharge: 2017-03-13 | Disposition: A | Discharge status: Home / Self Care | Payer: Managed Care, Other (non HMO) | Source: Ambulatory Visit | Attending: Obstetrics & Gynecology | Admitting: Obstetrics & Gynecology

## 2017-03-13 VITALS — BP 120/80 | HR 96 | Ht 62.0 in | Wt 128.8 lb

## 2017-03-13 DIAGNOSIS — N898 Other specified noninflammatory disorders of vagina: Secondary | ICD-10-CM | POA: Diagnosis not present

## 2017-03-13 DIAGNOSIS — N760 Acute vaginitis: Secondary | ICD-10-CM

## 2017-03-13 DIAGNOSIS — Z01419 Encounter for gynecological examination (general) (routine) without abnormal findings: Secondary | ICD-10-CM | POA: Diagnosis present

## 2017-03-13 DIAGNOSIS — B9689 Other specified bacterial agents as the cause of diseases classified elsewhere: Secondary | ICD-10-CM

## 2017-03-13 DIAGNOSIS — N870 Mild cervical dysplasia: Secondary | ICD-10-CM | POA: Insufficient documentation

## 2017-03-13 DIAGNOSIS — Z01411 Encounter for gynecological examination (general) (routine) with abnormal findings: Secondary | ICD-10-CM | POA: Diagnosis not present

## 2017-03-13 LAB — POCT WET PREP (WET MOUNT)
Clue Cells Wet Prep Whiff POC: POSITIVE
Trichomonas Wet Prep HPF POC: ABSENT

## 2017-03-13 MED ORDER — METRONIDAZOLE 500 MG PO TABS: 500.0000 mg | ORAL_TABLET | Freq: Two times a day (BID) | ORAL | 0 refills | Status: DC

## 2017-03-13 NOTE — Patient Instructions (Signed)
NO SEX UNTIL AFTER YOU GET YOUR BIRTH CONTROL    Bacterial Vaginosis Bacterial vaginosis is a vaginal infection that occurs when the normal balance of bacteria in the vagina is disrupted. It results from an overgrowth of certain bacteria. This is the most common vaginal infection among women ages 49-44. Because bacterial vaginosis increases your risk for STIs (sexually transmitted infections), getting treated can help reduce your risk for chlamydia, gonorrhea, herpes, and HIV (human immunodeficiency virus). Treatment is also important for preventing complications in pregnant women, because this condition can cause an early (premature) delivery. What are the causes? This condition is caused by an increase in harmful bacteria that are normally present in small amounts in the vagina. However, the reason that the condition develops is not fully understood. What increases the risk? The following factors may make you more likely to develop this condition:  Having a new sexual partner or multiple sexual partners.  Having unprotected sex.  Douching.  Having an intrauterine device (IUD).  Smoking.  Drug and alcohol abuse.  Taking certain antibiotic medicines.  Being pregnant.  You cannot get bacterial vaginosis from toilet seats, bedding, swimming pools, or contact with objects around you. What are the signs or symptoms? Symptoms of this condition include:  Grey or white vaginal discharge. The discharge can also be watery or foamy.  A fish-like odor with discharge, especially after sexual intercourse or during menstruation.  Itching in and around the vagina.  Burning or pain with urination.  Some women with bacterial vaginosis have no signs or symptoms. How is this diagnosed? This condition is diagnosed based on:  Your medical history.  A physical exam of the vagina.  Testing a sample of vaginal fluid under a microscope to look for a large amount of bad bacteria or abnormal  cells. Your health care provider may use a cotton swab or a small wooden spatula to collect the sample.  How is this treated? This condition is treated with antibiotics. These may be given as a pill, a vaginal cream, or a medicine that is put into the vagina (suppository). If the condition comes back after treatment, a second round of antibiotics may be needed. Follow these instructions at home: Medicines  Take over-the-counter and prescription medicines only as told by your health care provider.  Take or use your antibiotic as told by your health care provider. Do not stop taking or using the antibiotic even if you start to feel better. General instructions  If you have a female sexual partner, tell her that you have a vaginal infection. She should see her health care provider and be treated if she has symptoms. If you have a female sexual partner, he does not need treatment.  During treatment: ? Avoid sexual activity until you finish treatment. ? Do not douche. ? Avoid alcohol as directed by your health care provider. ? Avoid breastfeeding as directed by your health care provider.  Drink enough water and fluids to keep your urine clear or pale yellow.  Keep the area around your vagina and rectum clean. ? Wash the area daily with warm water. ? Wipe yourself from front to back after using the toilet.  Keep all follow-up visits as told by your health care provider. This is important. How is this prevented?  Do not douche.  Wash the outside of your vagina with warm water only.  Use protection when having sex. This includes latex condoms and dental dams.  Limit how many sexual partners you have. To  help prevent bacterial vaginosis, it is best to have sex with just one partner (monogamous).  Make sure you and your sexual partner are tested for STIs.  Wear cotton or cotton-lined underwear.  Avoid wearing tight pants and pantyhose, especially during summer.  Limit the amount of  alcohol that you drink.  Do not use any products that contain nicotine or tobacco, such as cigarettes and e-cigarettes. If you need help quitting, ask your health care provider.  Do not use illegal drugs. Where to find more information:  Centers for Disease Control and Prevention: AppraiserFraud.fi  American Sexual Health Association (ASHA): www.ashastd.org  U.S. Department of Health and Financial controller, Office on Women's Health: DustingSprays.pl or SecuritiesCard.it Contact a health care provider if:  Your symptoms do not improve, even after treatment.  You have more discharge or pain when urinating.  You have a fever.  You have pain in your abdomen.  You have pain during sex.  You have vaginal bleeding between periods. Summary  Bacterial vaginosis is a vaginal infection that occurs when the normal balance of bacteria in the vagina is disrupted.  Because bacterial vaginosis increases your risk for STIs (sexually transmitted infections), getting treated can help reduce your risk for chlamydia, gonorrhea, herpes, and HIV (human immunodeficiency virus). Treatment is also important for preventing complications in pregnant women, because the condition can cause an early (premature) delivery.  This condition is treated with antibiotic medicines. These may be given as a pill, a vaginal cream, or a medicine that is put into the vagina (suppository). This information is not intended to replace advice given to you by your health care provider. Make sure you discuss any questions you have with your health care provider. Document Released: 01/13/2005 Document Revised: 05/19/2016 Document Reviewed: 09/29/2015 Elsevier Interactive Patient Education  [employer].

## 2017-03-13 NOTE — Progress Notes (Signed)
WELL-WOMAN EXAMINATION Patient name: Allison Prince MRN [id number]  Date of birth: 07-10-1990 Chief Complaint:   Gynecologic Exam (pap/physcial)  History of Present Illness:   [name] is a 27 y.o. G53P1001 African American female 6wks s/p SVB after IOL for PROM and GHTN, being seen today for a routine well-woman exam. Rx'd norvasc at pp visit 2wks ago for PPHTN, stopped taking 2d ago as directed. Has appt next Friday for IUD insertion. No sex since prior to birth of baby Current complaints: increased d/c w/ odor  PCP: [place]      does not desire labs No LMP recorded. The current method of family planning is abstinence Last pap 2016. Results were: normal Last mammogram: never. Results were: n/a Last colonoscopy: never. Results were: n/a  Review of Systems:   Pertinent items are noted in HPI Denies any headaches, blurred vision, fatigue, shortness of breath, chest pain, abdominal pain, abnormal vaginal discharge/itching/odor/irritation, problems with periods, bowel movements, urination, or intercourse unless otherwise stated above. Pertinent History Reviewed:  Reviewed past medical,surgical, social and family history.  Reviewed problem list, medications and allergies. Physical Assessment:   Vitals:   03/13/17 1141  BP: 120/80  Pulse: 96  Weight: 128 lb 12.8 oz (58.4 kg)  Height: 5\' 2"  (1.575 m)  Body mass index is 23.56 kg/m.        Physical Examination:   General appearance - well appearing, and in no distress  Mental status - alert, oriented to person, place, and time  Psych:  She has a normal mood and affect  Skin - warm and dry, normal color, no suspicious lesions noted  Chest - effort normal, all lung fields clear to auscultation bilaterally  Heart - normal rate and regular rhythm  Neck:  midline trachea, no thyromegaly or nodules  Breasts - breasts appear normal, no suspicious masses, no skin or nipple changes or  axillary nodes  Abdomen - soft,  nontender, nondistended, no masses or organomegaly  Pelvic - VULVA: normal appearing vulva with no masses, tenderness or lesions  VAGINA: normal appearing vagina with normal color and thin white malodorous discharge, no lesions  CERVIX: normal appearing cervix without discharge or lesions, no CMT  Thin prep pap is done w/ HR HPV cotesting  UTERUS: uterus is felt to be normal size, shape, consistency and nontender   ADNEXA: No adnexal masses or tenderness noted.  Extremities:  No swelling or varicosities noted  Results for orders placed or performed in visit on 03/13/17 (from the past 24 hour(s))  POCT Wet Prep [facility] [place])   Collection Time: 03/13/17 12:04 PM  Result Value Ref Range   Source Wet Prep POC vaginal    WBC, Wet Prep HPF POC few    Bacteria Wet Prep HPF POC Few Few   BACTERIA WET PREP MORPHOLOGY POC     Clue Cells Wet Prep HPF POC Many (A) None   Clue Cells Wet Prep Whiff POC Positive Whiff    Yeast Wet Prep HPF POC None    KOH Wet Prep POC     Trichomonas Wet Prep HPF POC Absent Absent    Assessment & Plan:  1) Well-Woman Exam  2) 6wks s/p SVB after IOL for PROM/GHTN, bottlefeeding  3) Resolved PP HTN  4) BV> Rx metronidazole 500mg  BID x 7d for BV, no sex or etoh while taking   Labs/procedures today: pap, wet prep  Mammogram @28yo  or sooner if problems Colonoscopy @27yo  or sooner if problems  Orders Placed  This Encounter  Procedures  . POCT Wet Prep [facility])    Follow-up: Return for As scheduled 1wk for IUD insertion.  [place], [facility] 03/13/2017 12:08 PM

## 2017-03-13 NOTE — Addendum Note (Signed)
Addended by: Diona Fanti A on: 03/13/2017 12:27 PM   Modules accepted: Orders

## 2017-03-17 ENCOUNTER — Telehealth: Payer: Self-pay | Admitting: *Deleted

## 2017-03-17 LAB — CYTOLOGY - PAP
Chlamydia: NEGATIVE
Neisseria Gonorrhea: NEGATIVE

## 2017-03-17 NOTE — Telephone Encounter (Signed)
Patient informed pap came back abnormal and needs to have colpo before IUD can be placed. Explained to patient brief information regarding procedure and no sex until IUD insertion. All questions and patient verbalized understanding.

## 2017-03-20 ENCOUNTER — Ambulatory Visit: Payer: Managed Care, Other (non HMO) | Admitting: Women's Health

## 2017-03-20 ENCOUNTER — Encounter: Payer: Managed Care, Other (non HMO) | Admitting: Obstetrics & Gynecology

## 2017-03-30 ENCOUNTER — Encounter: Payer: Managed Care, Other (non HMO) | Admitting: Obstetrics & Gynecology

## 2017-04-07 ENCOUNTER — Other Ambulatory Visit: Payer: Self-pay | Admitting: Obstetrics & Gynecology

## 2017-04-07 ENCOUNTER — Telehealth: Payer: Self-pay | Admitting: Obstetrics & Gynecology

## 2017-04-07 ENCOUNTER — Ambulatory Visit (INDEPENDENT_AMBULATORY_CARE_PROVIDER_SITE_OTHER): Payer: Managed Care, Other (non HMO) | Admitting: Obstetrics & Gynecology

## 2017-04-07 ENCOUNTER — Encounter: Payer: Self-pay | Admitting: Obstetrics & Gynecology

## 2017-04-07 VITALS — BP 110/80 | HR 98 | Ht 62.0 in | Wt 131.6 lb

## 2017-04-07 DIAGNOSIS — N870 Mild cervical dysplasia: Secondary | ICD-10-CM | POA: Diagnosis not present

## 2017-04-07 DIAGNOSIS — Z3202 Encounter for pregnancy test, result negative: Secondary | ICD-10-CM

## 2017-04-07 LAB — POCT URINE PREGNANCY: PREG TEST UR: NEGATIVE

## 2017-04-07 MED ORDER — FLUCONAZOLE 150 MG PO TABS: 150.0000 mg | ORAL_TABLET | Freq: Once | ORAL | 0 refills | Status: AC

## 2017-04-07 NOTE — Telephone Encounter (Signed)
Patient states she forgot to ask about getting birth control today. She has been on the patch before and would like to use that again. Please advise.

## 2017-04-07 NOTE — Addendum Note (Signed)
Addended by: Gaylyn Rong A on: 04/07/2017 03:14 PM   Modules accepted: Orders

## 2017-04-07 NOTE — Telephone Encounter (Signed)
Patient called stating that she just left the office and forgot to ask Dr. Elonda Husky regarding her Birth control. Please contact pt

## 2017-04-07 NOTE — Progress Notes (Signed)
Colposcopy Procedure Note:  Colposcopy Procedure Note  Indications: Pap smear 1 months ago showed: low-grade squamous intraepithelial neoplasia (LGSIL - encompassing HPV,mild dysplasia,CIN I). The prior pap showed no abnormalities.  Prior cervical/vaginal disease: normal exam without visible pathology. Prior cervical treatment: no treatment.  Smoker:  Yes.   New sexual partner:  No.  : time frame:    History of abnormal Pap: no  Procedure Details  The risks and benefits of the procedure and Written informed consent obtained.  Speculum placed in vagina and excellent visualization of cervix achieved, cervix swabbed x 3 with acetic acid solution.  Findings:adequate colpo Cervix: acetowhite changes with punctation very light mosaicism from 12 o'clock to 8 oclock about the yransformation zone; cervical biopsies taken at 6 o'clock. Vaginal inspection: normal without visible lesions. Vulvar colposcopy: vulvar colposcopy not performed.  Specimens: cervical biopsy at 6 o'clock  Complications: none.  Plan: Will call with results, probably will require follow up 1 year

## 2017-04-08 ENCOUNTER — Other Ambulatory Visit: Payer: Self-pay | Admitting: Obstetrics & Gynecology

## 2017-04-08 MED ORDER — NORELGESTROMIN-ETH ESTRADIOL 150-35 MCG/24HR TD PTWK: 1.0000 | MEDICATED_PATCH | TRANSDERMAL | 12 refills | Status: DC

## 2017-04-08 NOTE — Telephone Encounter (Signed)
Informed patient BC patch was sent to pharmacy.

## 2017-04-08 NOTE — Telephone Encounter (Signed)
Ortho evra refilled

## 2017-11-07 ENCOUNTER — Emergency Department (HOSPITAL_COMMUNITY)
Admission: EM | Admit: 2017-11-07 | Discharge: 2017-11-07 | Disposition: A | Discharge status: Home / Self Care | Payer: Managed Care, Other (non HMO) | Attending: Emergency Medicine | Admitting: Emergency Medicine

## 2017-11-07 ENCOUNTER — Other Ambulatory Visit: Payer: Self-pay

## 2017-11-07 ENCOUNTER — Encounter (HOSPITAL_COMMUNITY): Payer: Self-pay | Admitting: Emergency Medicine

## 2017-11-07 DIAGNOSIS — R519 Headache, unspecified: Secondary | ICD-10-CM

## 2017-11-07 DIAGNOSIS — F17210 Nicotine dependence, cigarettes, uncomplicated: Secondary | ICD-10-CM | POA: Insufficient documentation

## 2017-11-07 DIAGNOSIS — Z79899 Other long term (current) drug therapy: Secondary | ICD-10-CM | POA: Insufficient documentation

## 2017-11-07 DIAGNOSIS — R51 Headache: Secondary | ICD-10-CM | POA: Diagnosis present

## 2017-11-07 MED ORDER — KETOROLAC TROMETHAMINE 30 MG/ML IJ SOLN
30.0000 mg | Freq: Once | INTRAMUSCULAR | Status: AC
  Administered 2017-11-07: 30 mg via INTRAVENOUS
  Filled 2017-11-07: qty 1

## 2017-11-07 MED ORDER — DIPHENHYDRAMINE HCL 50 MG/ML IJ SOLN
25.0000 mg | Freq: Once | INTRAMUSCULAR | Status: AC
  Administered 2017-11-07: 25 mg via INTRAVENOUS
  Filled 2017-11-07: qty 1

## 2017-11-07 MED ORDER — METOCLOPRAMIDE HCL 5 MG/ML IJ SOLN
10.0000 mg | Freq: Once | INTRAMUSCULAR | Status: AC
  Administered 2017-11-07: 10 mg via INTRAVENOUS
  Filled 2017-11-07: qty 2

## 2017-11-07 NOTE — ED Provider Notes (Signed)
[facility] EMERGENCY DEPARTMENT Provider Note   CSN: [id number] Arrival date & time: 11/07/17  1807     History   Chief Complaint Chief Complaint  Patient presents with  . Headache    HPI Allison Prince is a 27 y.o. female.  Patient complains of bad headache.  She states she has a history of headaches but usually does not have to come to the emergency department  The history is provided by the patient. No language interpreter was used.  Headache   This is a recurrent problem. The current episode started 12 to 24 hours ago. The problem occurs constantly. The problem has not changed since onset.The headache is associated with nothing. The pain is located in the bilateral and occipital region. The quality of the pain is described as dull. The pain is at a severity of 7/10. The pain is moderate. The pain does not radiate.    Past Medical History:  Diagnosis Date  . Allergy   . Fibroid   . Medical history non-contributory     Patient Active Problem List   Diagnosis Date Noted  . Postpartum hypertension 02/27/2017  . Smoker 06/25/2016  . Uterine fibroids 06/25/2016  . Tobacco use disorder 02/08/2014    Past Surgical History:  Procedure Laterality Date  . NO PAST SURGERIES    . tooth pulled       OB History    Gravida  1   Para  1   Term  1   Preterm      AB      Living  1     SAB      TAB      Ectopic      Multiple  0   Live Births  1            Home Medications    Prior to Admission medications   Medication Sig Start Date End Date Taking? Authorizing Provider  amLODipine (NORVASC) 5 MG tablet Take 1 tablet (5 mg total) by mouth daily. Patient not taking: Reported on 04/07/2017 02/27/17   Roma Schanz, CNM  ibuprofen (ADVIL,MOTRIN) 600 MG tablet Take 1 tablet (600 mg total) by mouth every 6 (six) hours. Patient not taking: Reported on 02/27/2017 02/01/17   [name], DO  metroNIDAZOLE (FLAGYL) 500 MG tablet Take 1 tablet (500  mg total) by mouth 2 (two) times daily. Patient not taking: Reported on 04/07/2017 03/13/17   Roma Schanz, CNM  norelgestromin-ethinyl estradiol (ORTHO EVRA) 150-35 MCG/24HR transdermal patch Place 1 patch onto the skin once a week. 04/08/17   Florian Buff, MD  Prenatal Vit-Fe Fumarate-FA (PRENATAL VITAMIN) 27-0.8 MG TABS Take 1 tablet by mouth daily. Patient not taking: Reported on 03/13/2017 10/06/16   Florian Buff, MD  senna-docusate (SENOKOT-S) 8.6-50 MG tablet Take 2 tablets by mouth daily. Patient not taking: Reported on 02/27/2017 02/02/17   [name], DO    Family History Family History  Problem Relation Age of Onset  . COPD Mother   . Depression Mother   . Diabetes Mother   . Hyperlipidemia Mother   . Hypertension Mother   . Asthma Brother   . Arthritis Paternal Grandfather     Social History Social History   Tobacco Use  . Smoking status: Current Every Day Smoker    Packs/day: 1.00    Years: 6.00    Pack years: 6.00    Types: Cigarettes  . Smokeless tobacco: Never Used  . Tobacco  comment: 1-2 a day  Substance Use Topics  . Alcohol use: No    Alcohol/week: 2.0 standard drinks    Types: 2 Shots of liquor per week    Comment: occ  . Drug use: No     Allergies   Latex and Asa [aspirin]   Review of Systems Review of Systems  Constitutional: Negative for appetite change and fatigue.  HENT: Negative for congestion, ear discharge and sinus pressure.   Eyes: Negative for discharge.  Respiratory: Negative for cough.   Cardiovascular: Negative for chest pain.  Gastrointestinal: Negative for abdominal pain and diarrhea.  Genitourinary: Negative for frequency and hematuria.  Musculoskeletal: Negative for back pain.  Skin: Negative for rash.  Neurological: Positive for headaches. Negative for seizures.  Psychiatric/Behavioral: Negative for hallucinations.     Physical Exam Updated Vital Signs BP 129/82 (BP Location: Left Arm)   Pulse 78   Temp 98.3  F (36.8 C) (Oral)   Resp 16   LMP 10/27/2017   SpO2 99%   Physical Exam  Constitutional: She is oriented to person, place, and time. She appears well-developed.  HENT:  Head: Normocephalic.  Eyes: Conjunctivae and EOM are normal. No scleral icterus.  Neck: Neck supple. No thyromegaly present.  Cardiovascular: Normal rate and regular rhythm. Exam reveals no gallop and no friction rub.  No murmur heard. Pulmonary/Chest: No stridor. She has no wheezes. She has no rales. She exhibits no tenderness.  Abdominal: She exhibits no distension. There is no tenderness. There is no rebound.  Musculoskeletal: Normal range of motion. She exhibits no edema.  Lymphadenopathy:    She has no cervical adenopathy.  Neurological: She is oriented to person, place, and time. She exhibits normal muscle tone. Coordination normal.  Skin: No rash noted. No erythema.  Psychiatric: She has a normal mood and affect. Her behavior is normal.     ED Treatments / Results  Labs (all labs ordered are listed, but only abnormal results are displayed) Labs Reviewed - No data to display  EKG None  Radiology No results found.  Procedures Procedures (including critical care time)  Medications Ordered in ED Medications  ketorolac (TORADOL) 30 MG/ML injection 30 mg (30 mg Intravenous Given 11/07/17 1955)  diphenhydrAMINE (BENADRYL) injection 25 mg (25 mg Intravenous Given 11/07/17 1955)  metoCLOPramide (REGLAN) injection 10 mg (10 mg Intravenous Given 11/07/17 1955)     Initial Impression / Assessment and Plan / ED Course  I have reviewed the triage vital signs and the nursing notes.  Pertinent labs & imaging results that were available during my care of the patient were reviewed by me and considered in my medical decision making (see chart for details).     Patient with migraine headache that relieved by migraine cocktail she will follow-up as needed  Final Clinical Impressions(s) / ED Diagnoses    Final diagnoses:  Bad headache    ED Discharge Orders    None       Milton Ferguson, MD 11/07/17 2041

## 2017-11-07 NOTE — ED Triage Notes (Signed)
Pt reports headache for two days. Had one episode of vomiting today. Pt reports was started on HTN meds after having child in January but was stopped after a few weeks after beginning. Stopped per PCP. Today increased BP noted. Pt took 10mg  norvasc around 3pm today with no relief in symptoms.

## 2017-11-07 NOTE — Discharge Instructions (Addendum)
Follow-up with your doctor as planned this week °

## 2017-11-10 ENCOUNTER — Ambulatory Visit (INDEPENDENT_AMBULATORY_CARE_PROVIDER_SITE_OTHER): Payer: Managed Care, Other (non HMO) | Admitting: Family Medicine

## 2017-11-10 ENCOUNTER — Encounter: Payer: Self-pay | Admitting: Family Medicine

## 2017-11-10 ENCOUNTER — Other Ambulatory Visit: Payer: Self-pay

## 2017-11-10 VITALS — BP 134/72 | HR 82 | Temp 98.1°F | Resp 14 | Ht 62.0 in | Wt 155.0 lb

## 2017-11-10 DIAGNOSIS — Z23 Encounter for immunization: Secondary | ICD-10-CM | POA: Diagnosis not present

## 2017-11-10 DIAGNOSIS — R030 Elevated blood-pressure reading, without diagnosis of hypertension: Secondary | ICD-10-CM

## 2017-11-10 DIAGNOSIS — R51 Headache: Secondary | ICD-10-CM

## 2017-11-10 DIAGNOSIS — R519 Headache, unspecified: Secondary | ICD-10-CM

## 2017-11-10 DIAGNOSIS — Z008 Encounter for other general examination: Secondary | ICD-10-CM

## 2017-11-10 MED ORDER — LISINOPRIL 5 MG PO TABS: 5.0000 mg | ORAL_TABLET | Freq: Every day | ORAL | 3 refills | Status: DC

## 2017-11-10 NOTE — Patient Instructions (Addendum)
Excedrin in afternoons - 3 day trial...   Head aches may be related to caffeine? I would avoid aspirin for now while we figure out what is causing headaches.   Keep track of headaches and BP log  Follow up in 2 weeks for complete physical and headache and BP recheck.      General Headache Without Cause A headache is pain or discomfort felt around the head or neck area. There are many causes and types of headaches. In some cases, the cause may not be found. Follow these instructions at home: Managing pain  Take over-the-counter and prescription medicines only as told by your doctor.  Lie down in a dark, quiet room when you have a headache.  If directed, apply ice to the head and neck area: ? Put ice in a plastic bag. ? Place a towel between your skin and the bag. ? Leave the ice on for 20 minutes, 2-3 times per day.  Use a heating pad or hot shower to apply heat to the head and neck area as told by your doctor.  Keep lights dim if bright lights bother you or make your headaches worse. Eating and drinking  Eat meals on a regular schedule.  Lessen how much alcohol you drink.  Lessen how much caffeine you drink, or stop drinking caffeine. General instructions  Keep all follow-up visits as told by your doctor. This is important.  Keep a journal to find out if certain things bring on headaches. For example, write down: ? What you eat and drink. ? How much sleep you get. ? Any change to your diet or medicines.  Relax by getting a massage or doing other relaxing activities.  Lessen stress.  Sit up straight. Do not tighten (tense) your muscles.  Do not use tobacco products. This includes cigarettes, chewing tobacco, or e-cigarettes. If you need help quitting, ask your doctor.  Exercise regularly as told by your doctor.  Get enough sleep. This often means 7-9 hours of sleep. Contact a doctor if:  Your symptoms are not helped by medicine.  You have a headache that  feels different than the other headaches.  You feel sick to your stomach (nauseous) or you throw up (vomit).  You have a fever. Get help right away if:  Your headache becomes really bad.  You keep throwing up.  You have a stiff neck.  You have trouble seeing.  You have trouble speaking.  You have pain in the eye or ear.  Your muscles are weak or you lose muscle control.  You lose your balance or have trouble walking.  You feel like you will pass out (faint) or you pass out.  You have confusion. This information is not intended to replace advice given to you by your health care provider. Make sure you discuss any questions you have with your health care provider. Document Released: 10/23/2007 Document Revised: 06/21/2015 Document Reviewed: 05/08/2014 Elsevier Interactive Patient Education  2018 [employer].  Preventing Hypertension Hypertension, commonly called high blood pressure, is when the force of blood pumping through the arteries is too strong. Arteries are blood vessels that carry blood from the heart throughout the body. Over time, hypertension can damage the arteries and decrease blood flow to important parts of the body, including the brain, heart, and kidneys. Often, hypertension does not cause symptoms until blood pressure is very high. For this reason, it is important to have your blood pressure checked on a regular basis. Hypertension can often be  prevented with diet and lifestyle changes. If you already have hypertension, you can control it with diet and lifestyle changes, as well as medicine. What nutrition changes can be made? Maintain a healthy diet. This includes:  Eating less salt (sodium). Ask your health care provider how much sodium is safe for you to have. The general recommendation is to consume less than 1 tsp (2,300 mg) of sodium a day. ? Do not add salt to your food. ? Choose low-sodium options when grocery shopping and eating out.  Limiting fats  in your diet. You can do this by eating low-fat or fat-free dairy products and by eating less red meat.  Eating more fruits, vegetables, and whole grains. Make a goal to eat: ? 1-2 cups of fresh fruits and vegetables each day. ? 3-4 servings of whole grains each day.  Avoiding foods and beverages that have added sugars.  Eating fish that contain healthy fats (omega-3 fatty acids), such as mackerel or salmon.  If you need help putting together a healthy eating plan, try the DASH diet. This diet is high in fruits, vegetables, and whole grains. It is low in sodium, red meat, and added sugars. DASH stands for Dietary Approaches to Stop Hypertension. What lifestyle changes can be made?  Lose weight if you are overweight. Losing just 3?5% of your body weight can help prevent or control hypertension. ? For example, if your present weight is 200 lb (91 kg), a loss of 3-5% of your weight means losing 6-10 lb (2.7-4.5 kg). ? Ask your health care provider to help you with a diet and exercise plan to safely lose weight.  Get enough exercise. Do at least 150 minutes of moderate-intensity exercise each week. ? You could do this in short exercise sessions several times a day, or you could do longer exercise sessions a few times a week. For example, you could take a brisk 10-minute walk or bike ride, 3 times a day, for 5 days a week.  Find ways to reduce stress, such as exercising, meditating, listening to music, or taking a yoga class. If you need help reducing stress, ask your health care provider.  Do not smoke. This includes e-cigarettes. Chemicals in tobacco and nicotine products raise your blood pressure each time you smoke. If you need help quitting, ask your health care provider.  Avoid alcohol. If you drink alcohol, limit alcohol intake to no more than 1 drink a day for nonpregnant women and 2 drinks a day for men. One drink equals 12 oz of beer, 5 oz of wine, or 1 oz of hard liquor. Why are  these changes important? Diet and lifestyle changes can help you prevent hypertension, and they may make you feel better overall and improve your quality of life. If you have hypertension, making these changes will help you control it and help prevent major complications, such as:  Hardening and narrowing of arteries that supply blood to: ? Your heart. This can cause a heart attack. ? Your brain. This can cause a stroke. ? Your kidneys. This can cause kidney failure.  Stress on your heart muscle, which can cause heart failure.  What can I do to lower my risk?  Work with your health care provider to make a hypertension prevention plan that works for you. Follow your plan and keep all follow-up visits as told by your health care provider.  Learn how to check your blood pressure at home. Make sure that you know your personal target blood  pressure, as told by your health care provider. How is this treated? In addition to diet and lifestyle changes, your health care provider may recommend medicines to help lower your blood pressure. You may need to try a few different medicines to find what works best for you. You also may need to take more than one medicine. Take over-the-counter and prescription medicines only as told by your health care provider. Where to find support: Your health care provider can help you prevent hypertension and help you keep your blood pressure at a healthy level. Your local hospital or your community may also provide support services and prevention programs. The American Heart Association offers an online support network at: CheapBootlegs.com.cy Where to find more information: Learn more about hypertension from:  National Heart, Lung, and Blood Institute: ElectronicHangman.is  Centers for Disease Control and Prevention: https://ingram.com/  American Academy of Family Physicians:  http://familydoctor.org/familydoctor/en/diseases-conditions/high-blood-pressure.printerview.all.html  Learn more about the DASH diet from:  [place], Lung, and [place]: https://www.reyes.com/  Contact a health care provider if:  You think you are having a reaction to medicines you have taken.  You have recurrent headaches or feel dizzy.  You have swelling in your ankles.  You have trouble with your vision. Summary  Hypertension often does not cause any symptoms until blood pressure is very high. It is important to get your blood pressure checked regularly.  Diet and lifestyle changes are the most important steps in preventing hypertension.  By keeping your blood pressure in a healthy range, you can prevent complications like heart attack, heart failure, stroke, and kidney failure.  Work with your health care provider to make a hypertension prevention plan that works for you. This information is not intended to replace advice given to you by your health care provider. Make sure you discuss any questions you have with your health care provider. Document Released: 01/28/2015 Document Revised: 09/24/2015 Document Reviewed: 09/24/2015 Elsevier Interactive Patient Education  [employer].

## 2017-11-10 NOTE — Progress Notes (Signed)
Patient ID: Allison Prince, female    DOB: 1990-02-12, 27 y.o.   MRN: [id number]  PCP: Alycia Rossetti, MD  Chief Complaint  Patient presents with  . Headache  . HTN    high BP /stress ER visit  . Annual Exam    needs labs for work physical form, future CPE    Subjective:    Allison Prince is a 27 y.o. female, presents to clinic with CC of headaches, hypertension?, also needs lab work, CPE form/biometric screening paperwork filled out.  Last complete physical was done Jun 11, 2016.  February this year pt was on BP meds after delivery of daughter in January had to take amlodipine due to high BP, at 6 week f/up BP was better and med was stopped.  Started having headaches for the past week, getting daily at around 5-6 pm.  Took her BP at work with one of the headaches 145/92.  Last week she made an appt to f/up on the BP and HA.  Saturday 11/07/17 had severe HA, more like a migraine, it was hard to keep her head up. Today BP was 153/99 took tylenol coming on this am  HA: Frontal HA, bothers eyes, have to quint sometimes, vision not blurry, no floaters or disturbances.  HA all gradual, no sudden onset or change, dull aching pain, "annoying", 9/10 last Saturday associated with vomiting, otherwise most times headache is mild to moderate  The ER she reports that she was given IV medications and fluids which helped temporarily but did not get rid of the headache. She takes tylenol and goody powders and usually it helps, but last Saturday it didn't help  FHx - no fhx of stroke, aneurisym, migraines  She does drink red bull or coffee every morning, does not have any caffeine throughout the rest the day. Headaches routinely come at about 5-6 pm She is sleeping okay, she does endorse some stress at work. She denies any history of migraines or headaches such as this, she denies any current sinus congestion, nasal drainage, sinus pain or pressure.  She does have some seasonal  allergies but last time they are bothersome was April 2019.  Current smoker, less than 1/2 ppd    Patient Active Problem List   Diagnosis Date Noted  . Postpartum hypertension 02/27/2017  . Smoker 06/25/2016  . Uterine fibroids 06/25/2016  . Tobacco use disorder 02/08/2014     Prior to Admission medications   Medication Sig Start Date End Date Taking? Authorizing Provider  norelgestromin-ethinyl estradiol (ORTHO EVRA) 150-35 MCG/24HR transdermal patch Place 1 patch onto the skin once a week. 04/08/17  Yes Florian Buff, MD     Allergies  Allergen Reactions  . Latex Itching and Rash  . Asa [Aspirin] Nausea And Vomiting     Family History  Problem Relation Age of Onset  . COPD Mother   . Depression Mother   . Diabetes Mother   . Hyperlipidemia Mother   . Hypertension Mother   . Asthma Brother   . Arthritis Paternal Grandfather      Social History   Socioeconomic History  . Marital status: Single    Spouse name: Not on file  . Number of children: Not on file  . Years of education: Not on file  . Highest education level: Not on file  Occupational History  . Not on file  Social Needs  . Financial resource strain: Not on file  . Food insecurity:  Worry: Not on file    Inability: Not on file  . Transportation needs:    Medical: Not on file    Non-medical: Not on file  Tobacco Use  . Smoking status: Current Every Day Smoker    Packs/day: 1.00    Years: 6.00    Pack years: 6.00    Types: Cigarettes  . Smokeless tobacco: Never Used  . Tobacco comment: 1-2 a day  Substance and Sexual Activity  . Alcohol use: No    Alcohol/week: 2.0 standard drinks    Types: 2 Shots of liquor per week    Comment: occ  . Drug use: No  . Sexual activity: Yes    Birth control/protection: Patch  Lifestyle  . Physical activity:    Days per week: Not on file    Minutes per session: Not on file  . Stress: Not on file  Relationships  . Social connections:    Talks on  phone: Not on file    Gets together: Not on file    Attends religious service: Not on file    Active member of club or organization: Not on file    Attends meetings of clubs or organizations: Not on file    Relationship status: Not on file  . Intimate partner violence:    Fear of current or ex partner: Not on file    Emotionally abused: Not on file    Physically abused: Not on file    Forced sexual activity: Not on file  Other Topics Concern  . Not on file  Social History Narrative  . Not on file     Review of Systems  Constitutional: Negative.  Negative for activity change, appetite change, chills, diaphoresis, fatigue and fever.  HENT: Negative.  Negative for congestion.   Eyes: Negative.   Respiratory: Negative.   Cardiovascular: Negative.   Gastrointestinal: Negative.   Endocrine: Negative.   Genitourinary: Negative.   Musculoskeletal: Negative.  Negative for neck pain and neck stiffness.  Skin: Negative.  Negative for pallor.  Allergic/Immunologic: Negative.   Neurological: Negative.  Negative for dizziness, tremors, syncope, facial asymmetry, speech difficulty and light-headedness.  Hematological: Negative.   Psychiatric/Behavioral: Negative.   All other systems reviewed and are negative.      Objective:    Vitals:   11/10/17 1514  BP: 134/72  Pulse: 82  Resp: 14  Temp: 98.1 F (36.7 C)  TempSrc: Oral  SpO2: 100%  Weight: 155 lb (70.3 kg)  Height: 5\' 2"  (1.575 m)      Physical Exam  Constitutional: She is oriented to person, place, and time. She appears well-developed and well-nourished.  Non-toxic appearance. She does not appear ill. No distress.  HENT:  Head: Normocephalic and atraumatic.  Right Ear: Tympanic membrane, external ear and ear canal normal.  Left Ear: Tympanic membrane, external ear and ear canal normal.  Nose: Mucosal edema present. Right sinus exhibits no maxillary sinus tenderness and no frontal sinus tenderness. Left sinus exhibits  no frontal sinus tenderness.  Mouth/Throat: Uvula is midline, oropharynx is clear and moist and mucous membranes are normal. Mucous membranes are not pale, not dry and not cyanotic. No oropharyngeal exudate, posterior oropharyngeal edema, posterior oropharyngeal erythema or tonsillar abscesses. No tonsillar exudate.  Nasal mucosa edematous and erythematous with scant discharge  Eyes: Pupils are equal, round, and reactive to light. Conjunctivae, EOM and lids are normal. Lids are everted and swept, no foreign bodies found. No scleral icterus. Right eye exhibits normal extraocular motion  and no nystagmus. Left eye exhibits normal extraocular motion and no nystagmus. Right pupil is round and reactive. Left pupil is round and reactive. Pupils are equal.  Neck: Trachea normal, normal range of motion and phonation normal. Neck supple. No spinous process tenderness and no muscular tenderness present. No neck rigidity. No tracheal deviation, no edema, no erythema and normal range of motion present. No Brudzinski's sign and no Kernig's sign noted.  Cardiovascular: Normal rate, regular rhythm, normal heart sounds and intact distal pulses. Exam reveals no gallop and no friction rub.  No murmur heard. Pulmonary/Chest: Effort normal and breath sounds normal. No stridor. No respiratory distress. She has no wheezes. She has no rales. She exhibits no tenderness.  Abdominal: Soft. Bowel sounds are normal. She exhibits no distension. There is no tenderness. There is no guarding.  Musculoskeletal: Normal range of motion.  Lymphadenopathy:    She has no cervical adenopathy.  Neurological: She is alert and oriented to person, place, and time. She has normal strength. She displays no atrophy and no tremor. No cranial nerve deficit or sensory deficit. She exhibits normal muscle tone. She displays no seizure activity. Coordination and gait normal.  5/5 grip strength in bilateral upper extremities, 5/5 dorsiflexion  plantarflexion bilaterally  Skin: Skin is warm. Capillary refill takes less than 2 seconds. No rash noted. She is not diaphoretic. No erythema. No pallor.  Psychiatric: She has a normal mood and affect. Her behavior is normal. Her mood appears not anxious. Her speech is not rapid and/or pressured. She is not agitated.  Nursing note and vitals reviewed.         Assessment & Plan:      ICD-10-CM   1. Nonintractable headache, unspecified chronicity pattern, unspecified headache type R51   2. Elevated BP without diagnosis of hypertension R03.0 lisinopril (PRINIVIL,ZESTRIL) 5 MG tablet  3. Need for immunization against influenza Z23 Flu Vaccine QUAD 36+ mos IM  4. Encounter for other general examination Z00.8 Lipid panel    COMPLETE METABOLIC PANEL WITH GFR    27 year old female presents with daily headaches and follow-up from ER visit, also elevated blood pressure. She does have quite a bit amount of caffeine, will avoid aspirin, 3-day trial of Excedrin Migraine to use in the afternoons prior to when headaches generally developed.  Patient given a blood pressure log to keep track of pressures and onset of headaches, other noted triggers such as any congestion, fever, decreased sleep the night before, increased stress, foods, drinks etc.  Lab work ordered today for patient to return next 1 to 2 weeks to do complete physical exam.  tx BP with low dose lisinopril - 5 mg, monitor BP, f/up sooner if sx/SE low or high BP.  Goal <130/80   Delsa Grana, PA-C 11/10/17 3:35 PM

## 2017-11-24 ENCOUNTER — Encounter: Payer: Self-pay | Admitting: Family Medicine

## 2017-11-24 ENCOUNTER — Ambulatory Visit (INDEPENDENT_AMBULATORY_CARE_PROVIDER_SITE_OTHER): Payer: Managed Care, Other (non HMO) | Admitting: Family Medicine

## 2017-11-24 ENCOUNTER — Other Ambulatory Visit: Payer: Self-pay

## 2017-11-24 VITALS — BP 128/74 | HR 68 | Temp 98.1°F | Resp 14 | Ht 62.0 in | Wt 156.0 lb

## 2017-11-24 DIAGNOSIS — R51 Headache: Secondary | ICD-10-CM | POA: Diagnosis not present

## 2017-11-24 DIAGNOSIS — R011 Cardiac murmur, unspecified: Secondary | ICD-10-CM | POA: Diagnosis not present

## 2017-11-24 DIAGNOSIS — R519 Headache, unspecified: Secondary | ICD-10-CM

## 2017-11-24 DIAGNOSIS — R030 Elevated blood-pressure reading, without diagnosis of hypertension: Secondary | ICD-10-CM | POA: Diagnosis not present

## 2017-11-24 NOTE — Progress Notes (Signed)
Patient ID: Allison Prince, female    DOB: 08-28-1990, 28 y.o.   MRN: [id number]  PCP: Alycia Rossetti, MD  Chief Complaint  Patient presents with  . Follow-up    has not picked up Lisinopril at this time    Subjective:   Allison Prince is a 27 y.o. female, presents to clinic with CC of elevated BP and HA's, 2 weeks follow up, was to treat with lisinopril and monitor BP's and for HA was to hydrate and try Excedrin migraine.    Hypertension, follow-up:  BP Readings from Last 3 Encounters:  11/24/17 128/74  11/10/17 134/72  11/07/17 129/82   She states she has been monitoring her blood pressures at work but she did not bring in the log with her she forgot.  The highest that she can remember while she was at work was 153/99.  She believes that the other readings were all around systolic 357S.  Not had any chest pain, shortness of breath, palpitations, near-syncope, lower extremity edema, and headaches have improved.  Headache, follow-up: She began taking Excedrin migraine when she felt headaches coming on in the afternoons when at work and she is taking this only a few times in the past 2 weeks and she reports otherwise her headaches have resolved.  She has had no visual disturbances, vertigo, numbness, tingling, neck pain.   Patient Active Problem List   Diagnosis Date Noted  . Postpartum hypertension 02/27/2017  . Smoker 06/25/2016  . Uterine fibroids 06/25/2016  . Tobacco use disorder 02/08/2014    Current Meds  Medication Sig  . norelgestromin-ethinyl estradiol (ORTHO EVRA) 150-35 MCG/24HR transdermal patch Place 1 patch onto the skin once a week.     Review of Systems  Constitutional: Negative.   HENT: Negative.   Eyes: Negative.   Respiratory: Negative.   Cardiovascular: Negative.   Gastrointestinal: Negative.   Endocrine: Negative.   Genitourinary: Negative.   Musculoskeletal: Negative.   Skin: Negative.   Allergic/Immunologic: Negative.     Neurological: Negative.   Hematological: Negative.   Psychiatric/Behavioral: Negative.   All other systems reviewed and are negative.      Objective:    Vitals:   11/24/17 1600  BP: 128/74  Pulse: 68  Resp: 14  Temp: 98.1 F (36.7 C)  TempSrc: Oral  SpO2: 99%  Weight: 156 lb (70.8 kg)  Height: 5\' 2"  (1.575 m)      Physical Exam  Constitutional: She is oriented to person, place, and time. She appears well-developed and well-nourished. No distress.  HENT:  Head: Normocephalic and atraumatic.  Right Ear: External ear normal.  Left Ear: External ear normal.  Nose: Nose normal.  Mouth/Throat: Oropharynx is clear and moist.  Eyes: Pupils are equal, round, and reactive to light. Conjunctivae and EOM are normal. Right eye exhibits no discharge. Left eye exhibits no discharge.  Neck: Normal range of motion. Neck supple. No tracheal deviation present.  Cardiovascular: Normal rate, regular rhythm and intact distal pulses. Exam reveals no gallop and no friction rub.  Murmur heard. Systolic murmur heard best over mitral area  Pulmonary/Chest: Effort normal and breath sounds normal. No stridor. No respiratory distress. She has no wheezes. She has no rales.  Abdominal: Soft. Bowel sounds are normal. She exhibits no distension. There is no tenderness.  Musculoskeletal: Normal range of motion.  Neurological: She is alert and oriented to person, place, and time. No cranial nerve deficit or sensory deficit. She exhibits normal muscle tone. Coordination  normal.  Skin: Skin is warm and dry. No rash noted. She is not diaphoretic.  Psychiatric: She has a normal mood and affect. Her behavior is normal. Judgment and thought content normal.  Nursing note and vitals reviewed.         Assessment & Plan:   Problem List Items Addressed This Visit    None    Visit Diagnoses    Nonintractable headache, unspecified chronicity pattern, unspecified headache type    -  Primary   improved, only  occurred a few times and successfully aborted with OTC excedrin migraine   Elevated BP without diagnosis of hypertension       Improved, today 128/74 w/o medications Will not start tx for HTN, con't monitoring    Newly recognized heart murmur       Relevant Orders   ECHOCARDIOGRAM COMPLETE      Patient's blood pressure improved without medical intervention, headaches have resolved Systolic murmur auscultated on exam will do follow-up echo to eval, pt recently gave birth (9 months) no hx of murmur    Delsa Grana, PA-C 11/24/17 4:12 PM

## 2017-12-03 ENCOUNTER — Ambulatory Visit (HOSPITAL_COMMUNITY): Admission: RE | Admit: 2017-12-03 | Payer: Managed Care, Other (non HMO) | Source: Ambulatory Visit

## 2017-12-14 ENCOUNTER — Telehealth: Payer: Self-pay

## 2017-12-14 NOTE — Telephone Encounter (Signed)
New message    Just an FYI. We have made several attempts to contact this patient including sending a letter to schedule or reschedule their echocardiogram. We will be removing the patient from the echo WQ.   Thank you 

## 2017-12-16 NOTE — Telephone Encounter (Signed)
Left message return call

## 2017-12-18 NOTE — Telephone Encounter (Signed)
Left message return call

## 2017-12-30 NOTE — Telephone Encounter (Signed)
Left message return call

## 2017-12-31 NOTE — Telephone Encounter (Signed)
Mychart message sent to patient on yesterday. Message has not been read yet.

## 2018-01-04 NOTE — Telephone Encounter (Signed)
I have attempted to reach patient by telephone 3 times and by mychart once with no response.

## 2018-01-06 NOTE — Telephone Encounter (Signed)
Thanks for your efforts.

## 2018-02-03 ENCOUNTER — Telehealth: Payer: Self-pay | Admitting: Obstetrics & Gynecology

## 2018-02-03 MED ORDER — NORELGESTROMIN-ETH ESTRADIOL 150-35 MCG/24HR TD PTWK: 1.0000 | MEDICATED_PATCH | TRANSDERMAL | 1 refills | Status: DC

## 2018-02-03 NOTE — Telephone Encounter (Signed)
LMOVM that a limited supply of refills on patch would be sent to pharmacy. Pt to make an appt for well woman exam for further refills.

## 2018-02-03 NOTE — Telephone Encounter (Signed)
Patient called stating that she would like the provider to call her in a refill of her BC patches. Pt uses [facility] in [place] Please contact pt when done.

## 2018-02-04 ENCOUNTER — Telehealth: Payer: Self-pay | Admitting: Obstetrics & Gynecology

## 2018-02-04 NOTE — Telephone Encounter (Signed)
Pt requesting 90 day supply of her birth control as that is what her insurance prefers. LMOVM that I would send her request to a provider and she could check with her pharmacy tomorrow.

## 2018-02-04 NOTE — Telephone Encounter (Signed)
Patient called, scheduled a pap/physical for 03/15/2018.  She wants to know if her bc that was just called in for 60 day supply could be changed to a 90 day supply due to insurance only covers a 90 day supply.  [phone]

## 2018-02-05 MED ORDER — NORELGESTROMIN-ETH ESTRADIOL 150-35 MCG/24HR TD PTWK: 1.0000 | MEDICATED_PATCH | TRANSDERMAL | 0 refills | Status: DC

## 2018-03-15 ENCOUNTER — Other Ambulatory Visit (HOSPITAL_COMMUNITY)
Admission: RE | Admit: 2018-03-15 | Discharge: 2018-03-15 | Disposition: A | Discharge status: Home / Self Care | Payer: Managed Care, Other (non HMO) | Source: Ambulatory Visit | Attending: Obstetrics & Gynecology | Admitting: Obstetrics & Gynecology

## 2018-03-15 ENCOUNTER — Encounter: Payer: Self-pay | Admitting: Women's Health

## 2018-03-15 ENCOUNTER — Ambulatory Visit (INDEPENDENT_AMBULATORY_CARE_PROVIDER_SITE_OTHER): Payer: Managed Care, Other (non HMO) | Admitting: Women's Health

## 2018-03-15 VITALS — BP 136/80 | HR 70 | Ht 62.25 in | Wt 155.5 lb

## 2018-03-15 DIAGNOSIS — Z113 Encounter for screening for infections with a predominantly sexual mode of transmission: Secondary | ICD-10-CM

## 2018-03-15 DIAGNOSIS — Z01419 Encounter for gynecological examination (general) (routine) without abnormal findings: Secondary | ICD-10-CM

## 2018-03-15 DIAGNOSIS — N760 Acute vaginitis: Secondary | ICD-10-CM

## 2018-03-15 DIAGNOSIS — N898 Other specified noninflammatory disorders of vagina: Secondary | ICD-10-CM

## 2018-03-15 DIAGNOSIS — R030 Elevated blood-pressure reading, without diagnosis of hypertension: Secondary | ICD-10-CM

## 2018-03-15 DIAGNOSIS — Z3009 Encounter for other general counseling and advice on contraception: Secondary | ICD-10-CM

## 2018-03-15 DIAGNOSIS — Z3202 Encounter for pregnancy test, result negative: Secondary | ICD-10-CM | POA: Diagnosis not present

## 2018-03-15 DIAGNOSIS — B9689 Other specified bacterial agents as the cause of diseases classified elsewhere: Secondary | ICD-10-CM

## 2018-03-15 LAB — POCT WET PREP (WET MOUNT)
CLUE CELLS WET PREP WHIFF POC: POSITIVE
Trichomonas Wet Prep HPF POC: ABSENT

## 2018-03-15 LAB — POCT URINE PREGNANCY: Preg Test, Ur: NEGATIVE

## 2018-03-15 MED ORDER — SECNIDAZOLE 2 G PO PACK: 1.0000 | PACK | Freq: Once | ORAL | 0 refills | Status: AC

## 2018-03-15 NOTE — Progress Notes (Signed)
WELL-WOMAN EXAMINATION Patient name: Allison Prince MRN [id number]  Date of birth: [birth year] Chief Complaint:   Gynecologic Exam (Family Planning; wants to start back on the birth control patch )  History of Present Illness:   Allison Prince is a 28 y.o. G22P1001 African American female being seen today for a routine well-woman exam.  Current complaints: wants to start birth control patch BP up, had PPHTN on norvasc last year, resolved by 6wks postpartum. She saw PCP Oct 2019 for headaches/elevated bp, rx'd lisinopril, f/u 2wks later- hadn't taken lisinopril and bp was normal. Having daily headaches now. Doesn't check bp at home. Occ checks it at work and reports it's always high. Mom has HTN.  Increased malodorous d/c after periods, no itching/irritation  PCP: [facility]      does not desire labs other than [facility] labs Patient's last menstrual period was 02/27/2018. The current method of family planning is condoms Last pap 02/2017. Results were: abnormal  LSIL, colpo Last mammogram: never. Results were: n/a. Family h/o breast cancer: No Last colonoscopy: never. Results were: n/a. Family h/o colorectal cancer: No Review of Systems:   Pertinent items are noted in HPI Denies any headaches, blurred vision, fatigue, shortness of breath, chest pain, abdominal pain, abnormal vaginal discharge/itching/odor/irritation, problems with periods, bowel movements, urination, or intercourse unless otherwise stated above. Pertinent History Reviewed:  Reviewed past medical,surgical, social and family history.  Reviewed problem list, medications and allergies. Physical Assessment:   Vitals:   03/15/18 1034 03/15/18 1122  BP: (!) 166/102 136/80  Pulse: 70   Weight: 155 lb 8 oz (70.5 kg)   Height: 5' 2.25" (1.581 m)   Body mass index is 28.21 kg/m.        Physical Examination:   General appearance - well appearing, and in no distress  Mental status - alert, oriented to person, place,  and time  Psych:  She has a normal mood and affect  Skin - warm and dry, normal color, no suspicious lesions noted  Chest - effort normal, all lung fields clear to auscultation bilaterally  Heart - normal rate and regular rhythm  Neck:  midline trachea, no thyromegaly or nodules  Breasts - breasts appear normal, no suspicious masses, no skin or nipple changes or  axillary nodes  Abdomen - soft, nontender, nondistended, no masses or organomegaly  Pelvic - VULVA: normal appearing vulva with no masses, tenderness or lesions  VAGINA: normal appearing vagina with normal color and thin white malodorous discharge, no lesions  CERVIX: normal appearing cervix without discharge or lesions, no CMT  Thin prep pap is done w/ reflex HR HPV cotesting  UTERUS: uterus is felt to be normal size, shape, consistency and nontender   ADNEXA: No adnexal masses or tenderness noted.  Extremities:  No swelling or varicosities noted  Results for orders placed or performed in visit on 03/15/18 (from the past 24 hour(s))  POCT urine pregnancy   Collection Time: 03/15/18 10:39 AM  Result Value Ref Range   Preg Test, Ur Negative Negative  POCT Wet Prep [facility] [place])   Collection Time: 03/15/18 11:12 AM  Result Value Ref Range   Source Wet Prep POC vaginal    WBC, Wet Prep HPF POC few    Bacteria Wet Prep HPF POC Few Few   BACTERIA WET PREP MORPHOLOGY POC     Clue Cells Wet Prep HPF POC Many (A) None   Clue Cells Wet Prep Whiff POC Positive Whiff  Yeast Wet Prep HPF POC None None   KOH Wet Prep POC     Trichomonas Wet Prep HPF POC Absent Absent    Assessment & Plan:  1) Well-Woman Exam  2) BV> wants to try solosec, rx sent  3) Elevated bp w/ h/o PPHTN> better on recheck, to check QID at home and keep log, make appt w/ PCP  4) Contraception counseling> estrogen not option currently w/ bp's, discussed progestin-only methods, pt wants to think about it, will let us know. To continue condoms for now  5) H/O  abnormal pap  6) STD screen  Labs/procedures today: pap w/ gc/ct, hiv, rpr, wet prep  Mammogram @29yo  or sooner if problems Colonoscopy @45 -25yo or sooner if problems  Orders Placed This Encounter  Procedures  . HIV Antibody (routine testing w rflx)  . RPR  . POCT urine pregnancy  . POCT Wet Prep [facility])    Follow-up: Return in about 1 year (around 03/16/2019) for Physical.  Roma Schanz CNM, [facility] 03/15/2018 11:22 AM

## 2018-03-15 NOTE — Patient Instructions (Signed)
Call [facility] to schedule appointment about blood pressures Check your blood pressure 4 times daily and keep a log, bring this with you to all appointments.   Bacterial Vaginosis  Bacterial vaginosis is a vaginal infection that occurs when the normal balance of bacteria in the vagina is disrupted. It results from an overgrowth of certain bacteria. This is the most common vaginal infection among women ages 68-44. Because bacterial vaginosis increases your risk for STIs (sexually transmitted infections), getting treated can help reduce your risk for chlamydia, gonorrhea, herpes, and HIV (human immunodeficiency virus). Treatment is also important for preventing complications in pregnant women, because this condition can cause an early (premature) delivery. What are the causes? This condition is caused by an increase in harmful bacteria that are normally present in small amounts in the vagina. However, the reason that the condition develops is not fully understood. What increases the risk? The following factors may make you more likely to develop this condition:  Having a new sexual partner or multiple sexual partners.  Having unprotected sex.  Douching.  Having an intrauterine device (IUD).  Smoking.  Drug and alcohol abuse.  Taking certain antibiotic medicines.  Being pregnant. You cannot get bacterial vaginosis from toilet seats, bedding, swimming pools, or contact with objects around you. What are the signs or symptoms? Symptoms of this condition include:  Grey or white vaginal discharge. The discharge can also be watery or foamy.  A fish-like odor with discharge, especially after sexual intercourse or during menstruation.  Itching in and around the vagina.  Burning or pain with urination. Some women with bacterial vaginosis have no signs or symptoms. How is this diagnosed? This condition is diagnosed based on:  Your medical history.  A physical exam of the  vagina.  Testing a sample of vaginal fluid under a microscope to look for a large amount of bad bacteria or abnormal cells. Your health care provider may use a cotton swab or a small wooden spatula to collect the sample. How is this treated? This condition is treated with antibiotics. These may be given as a pill, a vaginal cream, or a medicine that is put into the vagina (suppository). If the condition comes back after treatment, a second round of antibiotics may be needed. Follow these instructions at home: Medicines  Take over-the-counter and prescription medicines only as told by your health care provider.  Take or use your antibiotic as told by your health care provider. Do not stop taking or using the antibiotic even if you start to feel better. General instructions  If you have a female sexual partner, tell her that you have a vaginal infection. She should see her health care provider and be treated if she has symptoms. If you have a female sexual partner, he does not need treatment.  During treatment: ? Avoid sexual activity until you finish treatment. ? Do not douche. ? Avoid alcohol as directed by your health care provider. ? Avoid breastfeeding as directed by your health care provider.  Drink enough water and fluids to keep your urine clear or pale yellow.  Keep the area around your vagina and rectum clean. ? Wash the area daily with warm water. ? Wipe yourself from front to back after using the toilet.  Keep all follow-up visits as told by your health care provider. This is important. How is this prevented?  Do not douche.  Wash the outside of your vagina with warm water only.  Use protection when having sex. This  includes latex condoms and dental dams.  Limit how many sexual partners you have. To help prevent bacterial vaginosis, it is best to have sex with just one partner (monogamous).  Make sure you and your sexual partner are tested for STIs.  Wear cotton or  cotton-lined underwear.  Avoid wearing tight pants and pantyhose, especially during summer.  Limit the amount of alcohol that you drink.  Do not use any products that contain nicotine or tobacco, such as cigarettes and e-cigarettes. If you need help quitting, ask your health care provider.  Do not use illegal drugs. Where to find more information  Centers for Disease Control and Prevention: AppraiserFraud.fi  American Sexual Health Association (ASHA): www.ashastd.org  U.S. Department of Health and Financial controller, Office on Women's Health: DustingSprays.pl or SecuritiesCard.it Contact a health care provider if:  Your symptoms do not improve, even after treatment.  You have more discharge or pain when urinating.  You have a fever.  You have pain in your abdomen.  You have pain during sex.  You have vaginal bleeding between periods. Summary  Bacterial vaginosis is a vaginal infection that occurs when the normal balance of bacteria in the vagina is disrupted.  Because bacterial vaginosis increases your risk for STIs (sexually transmitted infections), getting treated can help reduce your risk for chlamydia, gonorrhea, herpes, and HIV (human immunodeficiency virus). Treatment is also important for preventing complications in pregnant women, because the condition can cause an early (premature) delivery.  This condition is treated with antibiotic medicines. These may be given as a pill, a vaginal cream, or a medicine that is put into the vagina (suppository). This information is not intended to replace advice given to you by your health care provider. Make sure you discuss any questions you have with your health care provider. Document Released: 01/13/2005 Document Revised: 05/19/2016 Document Reviewed: 09/29/2015 Elsevier Interactive Patient Education  2019 [employer].

## 2018-03-16 ENCOUNTER — Telehealth: Payer: Self-pay | Admitting: Women's Health

## 2018-03-16 LAB — HIV ANTIBODY (ROUTINE TESTING W REFLEX): HIV Screen 4th Generation wRfx: NONREACTIVE

## 2018-03-16 LAB — RPR: RPR Ser Ql: NONREACTIVE

## 2018-03-16 NOTE — Telephone Encounter (Signed)
Patient called stating that she seen Maudie Mercury yesterday and the medication that was sent to the pharmacy after her insurance it will cost her $150.00. Pt states that she can not afford this and would like something else called in. Please contact pt

## 2018-03-17 ENCOUNTER — Telehealth: Payer: Self-pay | Admitting: Women's Health

## 2018-03-17 LAB — CYTOLOGY - PAP
Chlamydia: NEGATIVE
HPV: NOT DETECTED
Neisseria Gonorrhea: NEGATIVE

## 2018-03-17 MED ORDER — METRONIDAZOLE 500 MG PO TABS: 500.0000 mg | ORAL_TABLET | Freq: Two times a day (BID) | ORAL | 0 refills | Status: DC

## 2018-03-17 NOTE — Telephone Encounter (Signed)
Needs to talk to someone about changing her meds to something her ins will cover

## 2018-03-17 NOTE — Telephone Encounter (Signed)
Spoke with pt. Pt states insurance is not covering Solosec for BV. Please advise. Thanks!! Cobb

## 2018-03-18 ENCOUNTER — Telehealth: Payer: Self-pay | Admitting: *Deleted

## 2018-03-18 NOTE — Telephone Encounter (Signed)
Attempted to call patient regarding abnormal papsmear, no answer.

## 2018-03-19 NOTE — Telephone Encounter (Signed)
Sent mychart message to patient with pap results and asked her to call us to set up colpo.

## 2018-03-22 ENCOUNTER — Telehealth: Payer: Self-pay | Admitting: *Deleted

## 2018-03-22 MED ORDER — NORETHINDRONE 0.35 MG PO TABS: 1.0000 | ORAL_TABLET | Freq: Every day | ORAL | 11 refills | Status: DC

## 2018-03-22 NOTE — Telephone Encounter (Signed)
LMOVM that I would send her request for birth control pills to a provider. Advised that she could check with her pharmacy later today.

## 2018-03-23 ENCOUNTER — Encounter: Payer: Self-pay | Admitting: Women's Health

## 2018-03-23 DIAGNOSIS — R87619 Unspecified abnormal cytological findings in specimens from cervix uteri: Secondary | ICD-10-CM | POA: Insufficient documentation

## 2018-04-01 ENCOUNTER — Other Ambulatory Visit: Payer: Self-pay | Admitting: Obstetrics and Gynecology

## 2018-04-01 ENCOUNTER — Encounter: Payer: Self-pay | Admitting: Obstetrics and Gynecology

## 2018-04-01 ENCOUNTER — Ambulatory Visit (INDEPENDENT_AMBULATORY_CARE_PROVIDER_SITE_OTHER): Payer: Managed Care, Other (non HMO) | Admitting: Obstetrics and Gynecology

## 2018-04-01 VITALS — Ht 62.0 in | Wt 153.8 lb

## 2018-04-01 DIAGNOSIS — Z3202 Encounter for pregnancy test, result negative: Secondary | ICD-10-CM | POA: Diagnosis not present

## 2018-04-01 DIAGNOSIS — N870 Mild cervical dysplasia: Secondary | ICD-10-CM

## 2018-04-01 DIAGNOSIS — R87611 Atypical squamous cells cannot exclude high grade squamous intraepithelial lesion on cytologic smear of cervix (ASC-H): Secondary | ICD-10-CM

## 2018-04-01 LAB — POCT URINE PREGNANCY: Preg Test, Ur: NEGATIVE

## 2018-04-01 NOTE — Addendum Note (Signed)
Addended by: Diona Fanti A on: 04/01/2018 04:40 PM   Modules accepted: Orders

## 2018-04-01 NOTE — Addendum Note (Signed)
Addended by: Diona Fanti A on: 04/01/2018 04:37 PM   Modules accepted: Orders

## 2018-04-01 NOTE — Progress Notes (Signed)
Patient ID: Allison Prince, female   DOB: 1990-02-05, 28 y.o.   MRN: [id number]   Allison Prince 28 y.o. [id number] here for colposcopy for ASC cannot exclude high grade lesion [facility]), negative HPV pap smear on 03/15/2018.  Discussed role for HPV in cervical dysplasia, need for surveillance.  Patient given informed consent, signed copy in the chart, time out was performed. Placed in lithotomy position. Cervix viewed with speculum and colposcope after application of acetic acid. Hurricaine spray used to numb the area.  Colposcopy adequate? Yes Acetowhite area encircling cervix from 11 o clock to 7 o clock along transition zone with some mosaic pattern from 11 o clock to 1 o clock Biopsies obtained at:  anterior lip: 11 to 1 o clock posterior lip: 6 to 7 o clock ECC specimen obtained. All specimens were labelled and sent to pathology. Monsel's solution used to stop bleeding Patient tolerated procedure extremely well; reports that it felt like pressure, not pain; would be good candidate for LEEP.   Colposcopy IMPRESSION: anterior lip: CIN II, posterior lip: CIN I   Patient was given post procedure instructions. Will follow up pathology and manage accordingly.  Routine preventative health maintenance measures emphasized.  F/U 1 month for LEEP  By signing my name below, I, De Burrs, attest that this documentation has been prepared under the direction and in the presence of Jonnie Kind, MD. Electronically Signed: De Burrs, Medical Scribe. 04/01/18. 4:08 PM.  .I personally performed the services described in this documentation, which was SCRIBED in my presence. The recorded information has been reviewed and considered accurate. It has been edited as necessary during review. Jonnie Kind, MD

## 2018-04-02 ENCOUNTER — Encounter: Payer: Self-pay | Admitting: *Deleted

## 2018-04-02 ENCOUNTER — Telehealth: Payer: Self-pay | Admitting: *Deleted

## 2018-04-02 ENCOUNTER — Ambulatory Visit (INDEPENDENT_AMBULATORY_CARE_PROVIDER_SITE_OTHER): Payer: Managed Care, Other (non HMO) | Admitting: Obstetrics and Gynecology

## 2018-04-02 ENCOUNTER — Encounter: Payer: Self-pay | Admitting: Obstetrics and Gynecology

## 2018-04-02 VITALS — BP 146/87 | HR 69 | Ht 62.0 in | Wt 154.8 lb

## 2018-04-02 DIAGNOSIS — R87619 Unspecified abnormal cytological findings in specimens from cervix uteri: Secondary | ICD-10-CM

## 2018-04-02 DIAGNOSIS — N888 Other specified noninflammatory disorders of cervix uteri: Secondary | ICD-10-CM

## 2018-04-02 NOTE — Progress Notes (Signed)
Patient ID: Allison Prince, female   DOB: 08-23-1990, 28 y.o.   MRN: [id number]   [facility] Visit  @DATE @            Patient name: Allison Prince MRN [id number]  Date of birth: [birth year]  CC & HPI:  Allison Prince is a 28 y.o. female presenting today for post-colpo procedure bleeding.  ROS:  ROS +post-procedure bleeding  Pertinent History Reviewed:   Reviewed:  Medical         Past Medical History:  Diagnosis Date  . Allergy   . Fibroid   . Medical history non-contributory                               Surgical Hx:    Past Surgical History:  Procedure Laterality Date  . NO PAST SURGERIES    . tooth pulled     Medications: Reviewed & Updated - see associated section                       Current Outpatient Medications:  .  metroNIDAZOLE (FLAGYL) 500 MG tablet, Take 1 tablet (500 mg total) by mouth 2 (two) times daily. (Patient not taking: Reported on 04/01/2018), Disp: 14 tablet, Rfl: 0 .  norethindrone (MICRONOR,CAMILA,ERRIN) 0.35 MG tablet, Take 1 tablet (0.35 mg total) by mouth daily., Disp: 1 Package, Rfl: 11   Social History: Reviewed -  reports that she has been smoking cigarettes. She has a 6.00 pack-year smoking history. She has never used smokeless tobacco.  Objective Findings:  Vitals: not currently breastfeeding.  PHYSICAL EXAMINATION General appearance - alert, well appearing, and in no distress Mental status - alert, oriented to person, place, and time, normal mood, behavior, speech, dress, motor activity, and thought processes, affect appropriate to mood  PELVIC Vagina - 10 cc of blood visualized  Cervix -slight oozing,  bleeding from 12 o'clock. Monsels had pulled off the cervixTreated antterior 11 & 1, posterior 6 and 7 with silver nitrate Hemostasis good.  Assessment & Plan:   A:  1.  Post-colposcopy bleeding  P:  1. Treated with silver nitrate on entire biopsy sites 2. Call if bleeding persists  By signing my name below,  I, Samul Dada, attest that this documentation has been prepared under the direction and in the presence of Jonnie Kind, MD. Electronically Signed: [place]. 04/02/18. 10:09 AM.  I personally performed the services described in this documentation, which was SCRIBED in my presence. The recorded information has been reviewed and considered accurate. It has been edited as necessary during review. Jonnie Kind, MD

## 2018-04-02 NOTE — Telephone Encounter (Signed)
Patient states at 5am her pad was soaked with blood. Had a colpo. Says she is having severe cramping but has not taken any Tylenol or Ibuprofen that the after hours nurse told her not to eat or drink anything and to go to the ER.  I informed the patient she did not need to go to the ER and could eat and drink and should take some Tylenol.  Will w/i to assess if she needs extra Monsels on her cervix.  Will come in at 10:15.

## 2018-04-06 ENCOUNTER — Encounter: Payer: Managed Care, Other (non HMO) | Admitting: Obstetrics and Gynecology

## 2018-05-03 ENCOUNTER — Telehealth: Payer: Self-pay | Admitting: *Deleted

## 2018-05-03 NOTE — Telephone Encounter (Signed)
Called patient and left message to call us back to set up tele/webex visit.

## 2018-05-04 ENCOUNTER — Ambulatory Visit (INDEPENDENT_AMBULATORY_CARE_PROVIDER_SITE_OTHER): Payer: Managed Care, Other (non HMO) | Admitting: Obstetrics and Gynecology

## 2018-05-04 ENCOUNTER — Encounter: Payer: Self-pay | Admitting: Obstetrics and Gynecology

## 2018-05-04 ENCOUNTER — Telehealth: Payer: Self-pay | Admitting: Obstetrics and Gynecology

## 2018-05-04 ENCOUNTER — Other Ambulatory Visit: Payer: Self-pay

## 2018-05-04 DIAGNOSIS — N870 Mild cervical dysplasia: Secondary | ICD-10-CM | POA: Diagnosis not present

## 2018-05-04 DIAGNOSIS — R87612 Low grade squamous intraepithelial lesion on cytologic smear of cervix (LGSIL): Secondary | ICD-10-CM

## 2018-05-04 NOTE — Telephone Encounter (Signed)
Left message with mom to have pt call our office.

## 2018-05-04 NOTE — Progress Notes (Addendum)
[facility] Visit   Followup of Colpo biopsies, and  Review menses  @DATE @            Patient name: Allison Prince MRN [id number]  Date of birth: 06-11-1990 This is a telephone visit after review of history chart, interaction patient and then documentation  TELEHEALTH VIRTUAL GYNECOLOGY VISIT ENCOUNTER NOTE  I connected with Allison Prince on 05/19/18 at  2:15 PM EDT by telephone at home and verified that I am speaking with the correct person using two identifiers.   I discussed the limitations, risks, security and privacy concerns of performing an evaluation and management service by telephone and the availability of in person appointments. I also discussed with the patient that there may be a patient responsible charge related to this service. The patient expressed understanding and agreed to proceed.   CC & HPI:  Allison Prince is a 28 y.o. female presenting today for follow-up of colposcopic biopsies and to review menstrual history on OCPs  The patient was seen already since her colposcopy with bleeding after the biopsies and treated with silver nitrate.  That resolved.  She did have what she thought was her normal menses while on the OCPs on week 3 of the monophasic OCPs bleeding April 2-5.  She is continue the OCPs and is told to expect a menses at the end of the pill pack She is back to full activities with no pain ROS:  ROS   Pertinent History Reviewed:   Reviewed: Significant for review of records shows this patient does NOT have any uterine fibroids documented.  I was incorrect in my initial pathology report when I referenced uterine fibroids the patient confirms she has no history of fibroids Medical         Past Medical History:  Diagnosis Date  . Allergy   . Fibroid   . Medical history non-contributory                               Surgical Hx:    Past Surgical History:  Procedure Laterality Date  . NO PAST SURGERIES    . tooth pulled      Medications: Reviewed & Updated - see associated section                       Current Outpatient Medications:  .  norethindrone (MICRONOR,CAMILA,ERRIN) 0.35 MG tablet, Take 1 tablet (0.35 mg total) by mouth daily., Disp: 1 Package, Rfl: 11 .  metroNIDAZOLE (FLAGYL) 500 MG tablet, Take 1 tablet (500 mg total) by mouth 2 (two) times daily. (Patient not taking: Reported on 04/01/2018), Disp: 14 tablet, Rfl: 0   Social History: Reviewed -  reports that she has been smoking cigarettes. She has a 6.00 pack-year smoking history. She has never used smokeless tobacco.  Objective Findings:  Vitals: Last menstrual period 04/29/2018, not currently breastfeeding.  Diagnosis 1. Endocervix, curettage - ENDOCERVICAL GLANDULAR EPITHELIUM AND OCCASIONAL SQUAMOUS EPITHELIAL CELLS. NO ATYPIA, DYSPLASIA, OR MALIGNANCY IDENTIFIED. 2. Cervix, biopsy, 12 o'clock - LOW GRADE SQUAMOUS INTRAEPITHELIAL LESION (CIN-I, MILD SQUAMOUS DYSPLASIA WITH HPV EFFECT) IN CERVICAL TRANSFORMATION ZONE MUCOSA. - NO INVASIVE PROCESS IDENTIFIED. 3. Cervix, biopsy, 6 o'clock - LOW GRADE SQUAMOUS INTRAEPITHELIAL LESION (CIN-1, MILD SQUAMOUS DYSPLASIA WITH HPV EFFECT) IN CERVICAL TRANSFORMATION ZONE MUCOSA. - NO INVASIVE PROCESS IDENTIFIED.  Assessment & Plan:   A:  1.  CIN I of cervix  with negative ECC 2. No record of uterine fibroids ( my documentation error)  P:  1. Reviewed with patient treatment options recommended follow-up in 1 year which patient agrees to.  Questions answered to patient satisfaction front office to be notified of recall 1 year  The provider spent over 15 minutes with the visit, including pre visit review, documentation, with >than 50% spent in counseling and coordination of care. I discussed the assessment and treatment plan with the patient. The patient was provided an opportunity to ask questions and all were answered. The patient agreed with the plan and demonstrated an understanding of the  instructions.   The patient was advised to call back or seek an in-person evaluation/go to the ED if the symptoms worsen or if the condition fails to improve as anticipated.  I provided 15 minutes of non-face-to-face time during this encounter.   Jonnie Kind, [place] [facility] [place], [place]

## 2018-07-21 ENCOUNTER — Telehealth: Payer: Self-pay | Admitting: *Deleted

## 2018-07-21 ENCOUNTER — Telehealth: Payer: Self-pay | Admitting: Women's Health

## 2018-07-21 NOTE — Telephone Encounter (Signed)
Patient called, stated her las cycle was the beginning of May, her test shows positive.  She is bleeding this morning w/clots.  She took another test yesterday and it was a faint positive.  Patient is requesting an appointment, please advise.  [phone]

## 2018-07-21 NOTE — Telephone Encounter (Signed)
Patient states she took a pregnancy test which is positive but is now having bleeding with clots.  LMP early May.  She is having little to no cramping.  Can we get Hcg or does she need to be seen? Please advise.

## 2018-07-22 ENCOUNTER — Other Ambulatory Visit: Payer: Self-pay

## 2018-07-22 ENCOUNTER — Emergency Department (HOSPITAL_COMMUNITY)
Admission: EM | Admit: 2018-07-22 | Discharge: 2018-07-23 | Disposition: A | Discharge status: Home / Self Care | Payer: Managed Care, Other (non HMO) | Attending: Emergency Medicine | Admitting: Emergency Medicine

## 2018-07-22 ENCOUNTER — Encounter (HOSPITAL_COMMUNITY): Payer: Self-pay | Admitting: Emergency Medicine

## 2018-07-22 ENCOUNTER — Telehealth: Payer: Self-pay | Admitting: *Deleted

## 2018-07-22 ENCOUNTER — Other Ambulatory Visit: Payer: Managed Care, Other (non HMO)

## 2018-07-22 DIAGNOSIS — F17210 Nicotine dependence, cigarettes, uncomplicated: Secondary | ICD-10-CM | POA: Diagnosis not present

## 2018-07-22 DIAGNOSIS — Z3A00 Weeks of gestation of pregnancy not specified: Secondary | ICD-10-CM | POA: Diagnosis not present

## 2018-07-22 DIAGNOSIS — O209 Hemorrhage in early pregnancy, unspecified: Secondary | ICD-10-CM

## 2018-07-22 DIAGNOSIS — R102 Pelvic and perineal pain: Secondary | ICD-10-CM | POA: Diagnosis not present

## 2018-07-22 DIAGNOSIS — O4690 Antepartum hemorrhage, unspecified, unspecified trimester: Secondary | ICD-10-CM

## 2018-07-22 DIAGNOSIS — O99330 Smoking (tobacco) complicating pregnancy, unspecified trimester: Secondary | ICD-10-CM | POA: Diagnosis not present

## 2018-07-22 DIAGNOSIS — Z91040 Latex allergy status: Secondary | ICD-10-CM | POA: Diagnosis not present

## 2018-07-22 DIAGNOSIS — O9989 Other specified diseases and conditions complicating pregnancy, childbirth and the puerperium: Secondary | ICD-10-CM | POA: Diagnosis not present

## 2018-07-22 LAB — CBC WITH DIFFERENTIAL/PLATELET
Abs Immature Granulocytes: 0.02 10*3/uL (ref 0.00–0.07)
Basophils Absolute: 0 10*3/uL (ref 0.0–0.1)
Basophils Relative: 0 %
Eosinophils Absolute: 0.2 10*3/uL (ref 0.0–0.5)
Eosinophils Relative: 2 %
HCT: 39.6 % (ref 36.0–46.0)
Hemoglobin: 12.6 g/dL (ref 12.0–15.0)
Immature Granulocytes: 0 %
Lymphocytes Relative: 31 %
Lymphs Abs: 3.4 10*3/uL (ref 0.7–4.0)
MCH: 27.4 pg (ref 26.0–34.0)
MCHC: 31.8 g/dL (ref 30.0–36.0)
MCV: 86.1 fL (ref 80.0–100.0)
Monocytes Absolute: 1 10*3/uL (ref 0.1–1.0)
Monocytes Relative: 9 %
Neutro Abs: 6.5 10*3/uL (ref 1.7–7.7)
Neutrophils Relative %: 58 %
Platelets: 342 10*3/uL (ref 150–400)
RBC: 4.6 MIL/uL (ref 3.87–5.11)
RDW: 14.9 % (ref 11.5–15.5)
WBC: 11.2 10*3/uL — ABNORMAL HIGH (ref 4.0–10.5)
nRBC: 0 % (ref 0.0–0.2)

## 2018-07-22 LAB — ABO/RH: ABO/RH(D): O POS

## 2018-07-22 LAB — BASIC METABOLIC PANEL
Anion gap: 9 (ref 5–15)
BUN: 13 mg/dL (ref 6–20)
CO2: 24 mmol/L (ref 22–32)
Calcium: 8.9 mg/dL (ref 8.9–10.3)
Chloride: 104 mmol/L (ref 98–111)
Creatinine, Ser: 0.55 mg/dL (ref 0.44–1.00)
GFR calc Af Amer: >60 mL/min (ref >60)
GFR calc non Af Amer: >60 mL/min (ref >60)
Glucose, Bld: 98 mg/dL (ref 70–99)
Potassium: 3.5 mmol/L (ref 3.5–5.1)
Sodium: 137 mmol/L (ref 135–145)

## 2018-07-22 NOTE — ED Provider Notes (Signed)
[facility] EMERGENCY DEPARTMENT Provider Note   CSN: [id number] Arrival date & time: 07/22/18  1751     History   Chief Complaint Chief Complaint  Patient presents with   Vaginal Bleeding    HPI Allison Prince is a 28 y.o. female.     Patient presents with vaginal bleeding intermittent for the past 1 week.  States her last menstrual cycle was the end of May.  She reports a positive pregnancy test yesterday and has had intermittent bleeding and cramping over the past week associated with clots.  States she had one episode of vomiting today.  Has diffuse abdominal cramping and has been bleeding about 5 or 6 pads were daily for the past week.  Denies dizziness or lightheadedness.  Denies any fever or chills.  She was seen for an outpatient blood draw today but does not know the results.  She came in tonight with worsening pain as well as vomiting.  This is her second pregnancy.  Denies any chest pain or shortness of breath.  Complains of worsening menstrual cramps across her lower abdomen and back.  No dizziness or lightheadedness.  The history is provided by the patient.  Vaginal Bleeding Associated symptoms: abdominal pain, back pain and nausea   Associated symptoms: no dizziness and no dysuria     Past Medical History:  Diagnosis Date   Allergy    Fibroid    Medical history non-contributory     Patient Active Problem List   Diagnosis Date Noted   Abnormal Pap smear of cervix 03/23/2018   Postpartum hypertension 02/27/2017   Smoker 06/25/2016   Uterine fibroids 06/25/2016   Tobacco use disorder 02/08/2014    Past Surgical History:  Procedure Laterality Date   NO PAST SURGERIES     tooth pulled       OB History    Gravida  1   Para  1   Term  1   Preterm      AB      Living  1     SAB      TAB      Ectopic      Multiple  0   Live Births  1            Home Medications    Prior to Admission medications   Medication Sig  Start Date End Date Taking? Authorizing Provider  metroNIDAZOLE (FLAGYL) 500 MG tablet Take 1 tablet (500 mg total) by mouth 2 (two) times daily. Patient not taking: Reported on 04/01/2018 03/17/18   Roma Schanz, CNM  norethindrone (MICRONOR,CAMILA,ERRIN) 0.35 MG tablet Take 1 tablet (0.35 mg total) by mouth daily. 03/22/18   Roma Schanz, CNM    Family History Family History  Problem Relation Age of Onset   COPD Mother    Depression Mother    Diabetes Mother    Hyperlipidemia Mother    Hypertension Mother    Asthma Brother    Arthritis Paternal Grandfather     Social History Social History   Tobacco Use   Smoking status: Current Every Day Smoker    Packs/day: 1.00    Years: 6.00    Pack years: 6.00    Types: Cigarettes   Smokeless tobacco: Never Used   Tobacco comment: 1-2 a day  Substance Use Topics   Alcohol use: Yes    Alcohol/week: 2.0 standard drinks    Types: 2 Shots of liquor per week    Comment: occ  Drug use: No     Allergies   Latex and Asa [aspirin]   Review of Systems Review of Systems  Constitutional: Negative for activity change and appetite change.  HENT: Negative for congestion and rhinorrhea.   Eyes: Negative for visual disturbance.  Respiratory: Negative for cough, chest tightness and shortness of breath.   Cardiovascular: Negative for chest pain.  Gastrointestinal: Positive for abdominal pain, nausea and vomiting.  Genitourinary: Positive for vaginal bleeding. Negative for dysuria and hematuria.  Musculoskeletal: Positive for back pain. Negative for arthralgias and myalgias.  Skin: Negative for rash.  Neurological: Negative for dizziness, weakness and headaches.    all other systems are negative except as noted in the HPI and PMH.    Physical Exam Updated Vital Signs BP (!) 151/98 (BP Location: Right Arm)    Pulse 70    Temp 98.1 F (36.7 C) (Oral)    Resp 18    LMP 07/15/2018    SpO2 100%   Physical Exam Vitals  signs and nursing note reviewed.  Constitutional:      General: She is not in acute distress.    Appearance: She is well-developed.  HENT:     Head: Normocephalic and atraumatic.     Mouth/Throat:     Pharynx: No oropharyngeal exudate.  Eyes:     Conjunctiva/sclera: Conjunctivae normal.     Pupils: Pupils are equal, round, and reactive to light.  Neck:     Musculoskeletal: Normal range of motion and neck supple.     Comments: No meningismus. Cardiovascular:     Rate and Rhythm: Normal rate and regular rhythm.     Heart sounds: Normal heart sounds. No murmur.  Pulmonary:     Effort: Pulmonary effort is normal. No respiratory distress.     Breath sounds: Normal breath sounds.  Abdominal:     Palpations: Abdomen is soft.     Tenderness: There is abdominal tenderness. There is no guarding or rebound.     Comments: Mild diffuse tenderness, no guarding or rebound  Genitourinary:    Comments: Chaperone present.  Normal external genitalia.  There is dark blood in the vaginal vault.  No CMT.  Cervix is closed.  Midline uterine pain, no no lateralizing adnexal pain. Musculoskeletal: Normal range of motion.        General: Tenderness present.     Comments: Paraspinal lumbar tenderness bilaterally  Skin:    General: Skin is warm.     Capillary Refill: Capillary refill takes less than 2 seconds.  Neurological:     General: No focal deficit present.     Mental Status: She is alert and oriented to person, place, and time. Mental status is at baseline.     Cranial Nerves: No cranial nerve deficit.     Motor: No abnormal muscle tone.     Coordination: Coordination normal.     Comments:  5/5 strength throughout. CN 2-12 intact.Equal grip strength.   Psychiatric:        Behavior: Behavior normal.      ED Treatments / Results  Labs (all labs ordered are listed, but only abnormal results are displayed) Labs Reviewed  WET PREP, GENITAL - Abnormal; Notable for the following components:       Result Value   WBC, Wet Prep HPF POC FEW (*)    All other components within normal limits  CBC WITH DIFFERENTIAL/PLATELET - Abnormal; Notable for the following components:   WBC 11.2 (*)    All other components  within normal limits  HCG, QUANTITATIVE, PREGNANCY - Abnormal; Notable for the following components:   hCG, Beta Chain, Quant, S 2,969 (*)    All other components within normal limits  BASIC METABOLIC PANEL  ABO/RH  GC/CHLAMYDIA PROBE AMP ([place]) NOT AT [facility]    EKG    Radiology US Ob Less Than 14 Weeks With Ob Transvaginal  Result Date: 07/23/2018 CLINICAL DATA:  Vaginal bleeding and pain. EXAM: OBSTETRIC <14 WK [place] AND TRANSVAGINAL OB US TECHNIQUE: Both transabdominal and transvaginal ultrasound examinations were performed for complete evaluation of the gestation as well as the maternal uterus, adnexal regions, and pelvic cul-de-sac. Transvaginal technique was performed to assess early pregnancy. COMPARISON:  None. FINDINGS: Intrauterine gestational sac: None Yolk sac:  Not Visualized. Embryo:  Not Visualized. Cardiac Activity: Not Visualized. Maternal uterus/adnexae: There is a 2.3 x 1.7 cm hypoechoic area in the posterior uterine fundus favored to represent a fibroid. There is a complex appearing cystic structure in the right ovary measuring 2 x 1.6 x 1.8 cm. This cystic structure demonstrates low level internal echoes. There appears to be surrounding color Doppler flow. There is some fluid in the lower uterine segment. The endometrium measures approximately 1.1 cm. There is a small amount of free fluid in the cul-de-sac. IMPRESSION: 1.  No IUP is visualized. By definition, in the setting of a positive pregnancy test, this reflects a pregnancy of unknown location. Differential considerations include early normal IUP, abnormal IUP/missed abortion, or nonvisualized ectopic pregnancy. Serial beta HCG is suggested. Consider repeat pelvic ultrasound in 14 days. 2. Probable 2 cm fibroid  posterior uterine fundus. 3. Complex cystic mass in the right ovary is favored to represent a corpus luteal cyst. 4. Small amount of fluid in the lower uterine segment. 5. Small amount of fluid in the cul-de-sac. Electronically Signed   By: Constance Holster M.D.   On: 07/23/2018 01:56    Procedures Procedures (including critical care time)  Medications Ordered in ED Medications - No data to display   Initial Impression / Assessment and Plan / ED Course  I have reviewed the triage vital signs and the nursing notes.  Pertinent labs & imaging results that were available during my care of the patient were reviewed by me and considered in my medical decision making (see chart for details).       Vaginal bleeding with possibility of pregnancy.  Hemodynamically stable.  Abdomen soft without peritoneal signs  hCG is 2969.  Rh is positive.  Not a RhoGam candidate.  Hemoglobin is stable.  Ultrasound does not show any IUP.  Discussed with Dr. Elly Modena.  She agrees pregnancy of un determined location.  Concern for possible ectopic pregnancy, possible miscarriage, possible normal IUP.  She recommends patient to have a repeat quant in 48 hours.  Will need to call her OB tomorrow. She does not feel the corpus luteal cyst represents anything ectopic.  Patient remains hemodynamically stable.  Patient understands that IUP is not confirmed. Needs repeat HCG in 48 hours and OB followup. Return to the ED sooner with worsening pain, bleeding, dizziness or any other concerns.   BP (!) 151/98 (BP Location: Right Arm)    Pulse 70    Temp 98.1 F (36.7 C) (Oral)    Resp 18    LMP 07/15/2018    SpO2 100%   Final Clinical Impressions(s) / ED Diagnoses   Final diagnoses:  Vaginal bleeding in pregnancy    ED Discharge Orders    None  Ezequiel Essex, MD 07/23/18 [phone]

## 2018-07-22 NOTE — Telephone Encounter (Signed)
Patient informed Anderson Malta suggested we get baseline Hcg.  Pt stated she is still having bleeding. Will come this afternoon.

## 2018-07-22 NOTE — ED Notes (Addendum)
Patient states last period was in May. Pt is now passing large clots and heavily bleeding. States she took a pregnancy test yesterday and was positive.

## 2018-07-22 NOTE — ED Triage Notes (Signed)
Patient states she is having cramping and heavy bleeding. Had a positive pregnancy test but doesn't know how far along she is. Patient states she has been bleeding for a week but the flow became heavy today.

## 2018-07-23 ENCOUNTER — Emergency Department (HOSPITAL_COMMUNITY): Payer: Managed Care, Other (non HMO)

## 2018-07-23 ENCOUNTER — Telehealth: Payer: Self-pay | Admitting: Obstetrics and Gynecology

## 2018-07-23 LAB — BETA HCG QUANT (REF LAB): hCG Quant: 2364 m[IU]/mL

## 2018-07-23 LAB — HCG, QUANTITATIVE, PREGNANCY: hCG, Beta Chain, Quant, S: 2969 m[IU]/mL — ABNORMAL HIGH (ref <5)

## 2018-07-23 LAB — WET PREP, GENITAL
Clue Cells Wet Prep HPF POC: NONE SEEN
Sperm: NONE SEEN
Trich, Wet Prep: NONE SEEN
Yeast Wet Prep HPF POC: NONE SEEN

## 2018-07-23 NOTE — Discharge Instructions (Signed)
Your ultrasound does not show a pregnancy in the uterus.  You need to have a repeat blood test in 48 hours.  Call your [facility] doctor later this morning to schedule this.  This pregnancy could be an early normal pregnancy, an early miscarriage or early ectopic pregnancy.  You need to have a repeat blood test in 48 hours and you need to have a repeat ultrasound in 2 weeks.  Return to the ED with worsening pain, dizziness, bleeding or other concerns.

## 2018-07-23 NOTE — Telephone Encounter (Signed)
Patient chart reviewed as ED F/u. Pt being followed for Pregnancy of unknown location. Pt was advised in ED to see f/u in 48 hours.  pt informed that if she needs f/u this weekend that coming to MAU at [facility] a good option . Pt given my  # for f/u if desired.

## 2018-07-26 ENCOUNTER — Other Ambulatory Visit: Payer: Self-pay | Admitting: *Deleted

## 2018-07-26 ENCOUNTER — Other Ambulatory Visit: Payer: Managed Care, Other (non HMO)

## 2018-07-27 LAB — BETA HCG QUANT (REF LAB): hCG Quant: 2526 m[IU]/mL

## 2018-07-27 LAB — GC/CHLAMYDIA PROBE AMP (~~LOC~~) NOT AT ARMC
Chlamydia: NEGATIVE
Neisseria Gonorrhea: NEGATIVE

## 2018-07-28 ENCOUNTER — Telehealth: Payer: Self-pay | Admitting: Obstetrics and Gynecology

## 2018-07-28 DIAGNOSIS — O3680X0 Pregnancy with inconclusive fetal viability, not applicable or unspecified: Secondary | ICD-10-CM

## 2018-07-28 NOTE — Telephone Encounter (Signed)
Pt is calling to get lab results from Monday and Last Thursday Please call and advise

## 2018-07-28 NOTE — Telephone Encounter (Signed)
Telephone call to patient. She is informed of the results of the HCG, which has declined to 2,500 range as of Monday. She reports she has been having cramping that "feels like a period", is specifically located centrally. She denies any left sided or right sided pain. She is not bleeding at present today, but has for 9 days until today. I have informed the patient that this is either a miscarriage, or an ectopic, and that we cannot be sure as of yet. The pregnancy is nonviable, based on labs  Treatment options reviewed: 1. Make an office appointment for endometrial biopsy to see if pregnancy tissue identifiable on endometrial biopsy, confirming dx of miscarriage. 2. Follow HCG levels weekly to confirm prompt decline as would be consistent with miscarriage. 3 Refer to MAU for proceeding with Methotrexate at this time , for Pregnancy of unknown location, known to be nonviable.  Pros and cons of various methods reviewed to pt satisfaction. Pt made aware that any pain that is one sided, or significantly increased, should be evaluated at MAU at [facility]. Pt aware that ectopic pregnancy has not been ruled out, just that pregnancy is not viable, and if there is any worsened pain,particularly one sided, that prompt MAU eval is warranted. Pt desires to check HCG in a week. Pt knows to followup earlier for pain  HCG quantitative ordered for Monday.

## 2018-08-02 ENCOUNTER — Other Ambulatory Visit: Payer: Self-pay

## 2018-08-02 ENCOUNTER — Other Ambulatory Visit: Payer: Managed Care, Other (non HMO)

## 2018-08-02 DIAGNOSIS — O039 Complete or unspecified spontaneous abortion without complication: Secondary | ICD-10-CM

## 2018-08-03 LAB — BETA HCG QUANT (REF LAB): hCG Quant: 2350 m[IU]/mL

## 2018-08-04 ENCOUNTER — Encounter: Payer: Self-pay | Admitting: Obstetrics and Gynecology

## 2018-08-04 ENCOUNTER — Inpatient Hospital Stay (HOSPITAL_COMMUNITY): Payer: Managed Care, Other (non HMO)

## 2018-08-04 ENCOUNTER — Encounter (HOSPITAL_COMMUNITY): Payer: Self-pay | Admitting: *Deleted

## 2018-08-04 ENCOUNTER — Other Ambulatory Visit: Payer: Self-pay

## 2018-08-04 ENCOUNTER — Telehealth: Payer: Self-pay | Admitting: *Deleted

## 2018-08-04 ENCOUNTER — Other Ambulatory Visit (INDEPENDENT_AMBULATORY_CARE_PROVIDER_SITE_OTHER): Payer: Managed Care, Other (non HMO)

## 2018-08-04 ENCOUNTER — Inpatient Hospital Stay (HOSPITAL_COMMUNITY)
Admission: AD | Admit: 2018-08-04 | Discharge: 2018-08-04 | Disposition: A | Discharge status: Home / Self Care | Payer: Managed Care, Other (non HMO) | Source: Ambulatory Visit | Attending: Obstetrics and Gynecology | Admitting: Obstetrics and Gynecology

## 2018-08-04 ENCOUNTER — Other Ambulatory Visit: Payer: Self-pay | Admitting: Obstetrics and Gynecology

## 2018-08-04 ENCOUNTER — Ambulatory Visit (INDEPENDENT_AMBULATORY_CARE_PROVIDER_SITE_OTHER): Payer: Managed Care, Other (non HMO) | Admitting: Obstetrics and Gynecology

## 2018-08-04 VITALS — BP 149/93 | HR 84 | Ht 62.0 in | Wt 162.6 lb

## 2018-08-04 DIAGNOSIS — O209 Hemorrhage in early pregnancy, unspecified: Secondary | ICD-10-CM

## 2018-08-04 DIAGNOSIS — Z3200 Encounter for pregnancy test, result unknown: Secondary | ICD-10-CM

## 2018-08-04 DIAGNOSIS — O00102 Left tubal pregnancy without intrauterine pregnancy: Secondary | ICD-10-CM | POA: Diagnosis not present

## 2018-08-04 DIAGNOSIS — F17210 Nicotine dependence, cigarettes, uncomplicated: Secondary | ICD-10-CM | POA: Insufficient documentation

## 2018-08-04 DIAGNOSIS — Z3A01 Less than 8 weeks gestation of pregnancy: Secondary | ICD-10-CM

## 2018-08-04 DIAGNOSIS — O3680X0 Pregnancy with inconclusive fetal viability, not applicable or unspecified: Secondary | ICD-10-CM

## 2018-08-04 DIAGNOSIS — O208 Other hemorrhage in early pregnancy: Secondary | ICD-10-CM | POA: Diagnosis not present

## 2018-08-04 DIAGNOSIS — O009 Unspecified ectopic pregnancy without intrauterine pregnancy: Secondary | ICD-10-CM | POA: Diagnosis present

## 2018-08-04 LAB — COMPREHENSIVE METABOLIC PANEL
ALT: 18 U/L (ref 0–44)
AST: 17 U/L (ref 15–41)
Albumin: 4 g/dL (ref 3.5–5.0)
Alkaline Phosphatase: 30 U/L — ABNORMAL LOW (ref 38–126)
Anion gap: 9 (ref 5–15)
BUN: 10 mg/dL (ref 6–20)
CO2: 24 mmol/L (ref 22–32)
Calcium: 9.4 mg/dL (ref 8.9–10.3)
Chloride: 106 mmol/L (ref 98–111)
Creatinine, Ser: 0.74 mg/dL (ref 0.44–1.00)
GFR calc Af Amer: >60 mL/min (ref >60)
GFR calc non Af Amer: >60 mL/min (ref >60)
Glucose, Bld: 83 mg/dL (ref 70–99)
Potassium: 4.2 mmol/L (ref 3.5–5.1)
Sodium: 139 mmol/L (ref 135–145)
Total Bilirubin: 0.5 mg/dL (ref 0.3–1.2)
Total Protein: 6.9 g/dL (ref 6.5–8.1)

## 2018-08-04 LAB — CBC
HCT: 36.6 % (ref 36.0–46.0)
Hemoglobin: 12.2 g/dL (ref 12.0–15.0)
MCH: 28.1 pg (ref 26.0–34.0)
MCHC: 33.3 g/dL (ref 30.0–36.0)
MCV: 84.3 fL (ref 80.0–100.0)
Platelets: 356 10*3/uL (ref 150–400)
RBC: 4.34 MIL/uL (ref 3.87–5.11)
RDW: 14.2 % (ref 11.5–15.5)
WBC: 8.3 10*3/uL (ref 4.0–10.5)
nRBC: 0 % (ref 0.0–0.2)

## 2018-08-04 LAB — HCG, QUANTITATIVE, PREGNANCY: hCG, Beta Chain, Quant, S: 3318 m[IU]/mL — ABNORMAL HIGH (ref <5)

## 2018-08-04 MED ORDER — METHOTREXATE FOR ECTOPIC PREGNANCY
50.0000 mg/m2 | Freq: Once | INTRAMUSCULAR | Status: AC
  Administered 2018-08-04: 19:00:00 90 mg via INTRAMUSCULAR
  Filled 2018-08-04: qty 1

## 2018-08-04 NOTE — MAU Provider Note (Addendum)
History     CSN: [id number]  Arrival date and time: 08/04/18 1559   First Provider Initiated Contact with Patient 08/04/18 1711      Chief Complaint  Patient presents with  . Ectopic Pregnancy   28 y.o. G2P1001 at unknown gestation sent from office for abnormal qhcg's and possible ectopic pregnancy. Pt denies pain or bleeding today but reports 8 days of bleeding that ended 2 days ago.    OB History    Gravida  2   Para  1   Term  1   Preterm      AB      Living  1     SAB      TAB      Ectopic      Multiple  0   Live Births  1           Past Medical History:  Diagnosis Date  . Allergy   . Fibroid   . Medical history non-contributory     Past Surgical History:  Procedure Laterality Date  . NO PAST SURGERIES    . tooth pulled      Family History  Problem Relation Age of Onset  . COPD Mother   . Depression Mother   . Diabetes Mother   . Hyperlipidemia Mother   . Hypertension Mother   . Asthma Brother   . Arthritis Paternal Grandfather     Social History   Tobacco Use  . Smoking status: Current Every Day Smoker    Packs/day: 1.00    Years: 6.00    Pack years: 6.00    Types: Cigarettes  . Smokeless tobacco: Never Used  . Tobacco comment: 1-2 a day  Substance Use Topics  . Alcohol use: Yes    Alcohol/week: 2.0 standard drinks    Types: 2 Shots of liquor per week    Comment: occ  . Drug use: No    Allergies:  Allergies  Allergen Reactions  . Latex Itching and Rash  . Asa [Aspirin] Nausea And Vomiting    No medications prior to admission.    Review of Systems  Gastrointestinal: Negative for abdominal pain.  Genitourinary: Negative for vaginal bleeding.   Physical Exam   Blood pressure 135/80, pulse 66, temperature 98.7 F (37.1 C), resp. rate 18, height 5\' 2"  (1.575 m), weight 73.5 kg, last menstrual period 07/15/2018, not currently breastfeeding.  Physical Exam  Constitutional: She is oriented to person, place, and  time. She appears well-developed and well-nourished. No distress.  HENT:  Head: Normocephalic and atraumatic.  Neck: Normal range of motion.  Cardiovascular: Normal rate.  Respiratory: Effort normal.  GI: Soft. She exhibits no distension and no mass. There is no abdominal tenderness. There is no rebound and no guarding.  Genitourinary:    Genitourinary Comments: Bimanual: non-enlarged, non-tender, mobile, no adnexal mass or tenderness, no CMT   Musculoskeletal: Normal range of motion.  Neurological: She is alert and oriented to person, place, and time.  Skin: Skin is warm and dry.  Psychiatric: She has a normal mood and affect.   Results for orders placed or performed during the hospital encounter of 08/04/18 (from the past 24 hour(s))  CBC     Status: None   Collection Time: 08/04/18  5:04 PM  Result Value Ref Range   WBC 8.3 4.0 - 10.5 K/uL   RBC 4.34 3.87 - 5.11 MIL/uL   Hemoglobin 12.2 12.0 - 15.0 g/dL   HCT 36.6 36.0 - 46.0 %  MCV 84.3 80.0 - 100.0 fL   MCH 28.1 26.0 - 34.0 pg   MCHC 33.3 30.0 - 36.0 g/dL   RDW 14.2 11.5 - 15.5 %   Platelets 356 150 - 400 K/uL   nRBC 0.0 0.0 - 0.2 %  hCG, quantitative, pregnancy     Status: Abnormal   Collection Time: 08/04/18  5:04 PM  Result Value Ref Range   hCG, Beta Chain, Quant, S 3,318 (H) <5 mIU/mL  Comprehensive metabolic panel     Status: Abnormal   Collection Time: 08/04/18  5:04 PM  Result Value Ref Range   Sodium 139 135 - 145 mmol/L   Potassium 4.2 3.5 - 5.1 mmol/L   Chloride 106 98 - 111 mmol/L   CO2 24 22 - 32 mmol/L   Glucose, Bld 83 70 - 99 mg/dL   BUN 10 6 - 20 mg/dL   Creatinine, Ser 0.74 0.44 - 1.00 mg/dL   Calcium 9.4 8.9 - 10.3 mg/dL   Total Protein 6.9 6.5 - 8.1 g/dL   Albumin 4.0 3.5 - 5.0 g/dL   AST 17 15 - 41 U/L   ALT 18 0 - 44 U/L   Alkaline Phosphatase 30 (L) 38 - 126 U/L   Total Bilirubin 0.5 0.3 - 1.2 mg/dL   GFR calc non Af Amer >60 >60 mL/min   GFR calc Af Amer >60 >60 mL/min   Anion gap 9 5 -  15   US Ob Transvaginal  Result Date: 08/04/2018 DATING AND VIABILITY SONOGRAM ADELAYDE MINNEY is a 28 y.o. year old G2P1001 with unknown LMP. Marland Kitchen  She has regular menstrual cycles.   She is here today for a confirmatory initial sonogram with vaginal bleeding. QHCG 07/28/2018 2,526,08/02/2018 2,350. GESTATION: NO IUP, thin endometrial stripe. FETAL ACTIVITY:          Heart rate         N/A          CERVIX: Appears closed ADNEXA: The ovaries are normal.  2.7 x 1.4 x 2.5 cm complex left adnexal mass adjacent to left ovary with color flow GESTATIONAL AGE AND  BIOMETRICS: Gestational criteria: Estimated Date of Delivery: unknown LMP Previous Scans:1 outside ultrasound GESTATIONAL SAC No IUP visualized Complex left adnexal mass adj.to left ovary  CROWN RUMP LENGTH     n/a                                                                     TECHNICIAN COMMENTS: PELVIC US HB:[id number] anteverted uterus with mult.fibroids,(#1) posterior subserosal fibroid  1.9 x 1.7 x 2.1 cm,(#2) posterior right intramural fibroid 1.3 x 1 x .8 cm,no IUP visualized,EEC 6.7 mm,normal ovaries bilat, 2.7 x 1.4 x 2.5 cm complex left adnexal mass adjacent to left ovary with color flow,reviewed images with Dr.Ferguson Chaperone Marcie Bal A copy of this report including all images has been saved and backed up to a second source for retrieval if needed. All measures and details of the anatomical scan, placentation, fluid volume and pelvic anatomy are contained in that report. Amber Heide Guile 08/04/2018 4:36 PM Clinical Impression and recommendations: I have reviewed the sonogram results above.Followup u/s for PUL. Patient is assymptomatic at present. Combined with the patient's current clinical course, below  are my impressions and any appropriate recommendations for management based on the sonographic findings: FINDINGS: Intrauterine gestational sac: NO   , Thin endometiral stripe                               Yolk sac no      :  Visible Embryo: no*                               Subchorionic hemorrhage:   None visualized.                              Maternal uterus/adnexae: Ovaries are noted bilaterally within normal limits.                                                                       Right ovary normal                                                                       Left ovary    With a 2.7 x 1.4 x 2.5 cm descrete adnexal mass with heterogenous texture lateral to the ovary.                                                                       Free Fluid  Small amount without debris.     NO evidence ofviable intrauterine pregnancy as above.                                      Presumed left ectopic pregnancy , currently stable and a candidate for Methotrexate.                 Plan: counselled, and referred to MAU for pre-Tx labs and Methotrexate.                                           Jonnie Kind MD 08/04/2018   US Ob Less Than 14 Weeks With Ob Transvaginal  Result Date: 08/04/2018 CLINICAL DATA:  Suspected ectopic pregnancy.  Pain.  Pregnancy. EXAM: OBSTETRIC <14 WK [place] AND TRANSVAGINAL OB US TECHNIQUE: Both transabdominal and transvaginal ultrasound examinations were performed for complete evaluation of the gestation as well as the maternal uterus, adnexal regions, and pelvic cul-de-sac. Transvaginal technique was performed to assess early pregnancy. COMPARISON:  July 23, 2018 FINDINGS: Intrauterine gestational sac: None Maternal uterus/adnexae: The  ovaries are normal in appearance. There is a mass in the left adnexa separate from the ovary. This mass is just lateral to the left ovary and is heterogeneous in echotexture with some internal vascularity. The mass measures 3.3 x 1.7 x 1.9 cm. Incidentally, there is a 2.2 cm fundal fibroid. IMPRESSION: 1. The findings are most consistent with an ectopic pregnancy. There is a mass lateral to the left ovary. No IUP is identified. There is a small amount of fluid adjacent to the left adnexal mass.  The presumed ectopic pregnancy measures 1.9 x 3.3 x 1.7 cm. Findings called to the patient's nurse midwife, Julianne Handler Electronically Signed   By: Dorise Bullion III M.D   On: 08/04/2018 17:53   MAU Course  Procedures  MDM Review of chart:  07/22/18 qhcg 2364 and US shows no IUP or adnexal mass 07/26/18 qhcg 2526 08/02/18 qhcg 2350 08/04/18 [place] in office shows 2.7 x 1.4 x 2.5 cm complex left adnexal mass adjacent to left ovary with color flow and no IUP.  [place] and labs repeated in MAU today. Left ectopic pregnancy identified with small amt of fluid adjacent.  Consult with Dr. Harolyn Rutherford, candidate for MTX, discussed with pt and agrees to proceed. Discussed importance of interval follow up which she states she can comply.   Assessment and Plan   1. Left tubal pregnancy without intrauterine pregnancy   2. Encounter for assessment for suspected ectopic pregnancy    Discharge home Follow up on 08/07/18 in MAU for labs Return precautions Pelvic rest  Allergies as of 08/04/2018      Reactions   Latex Itching, Rash   Asa [aspirin] Nausea And Vomiting      Medication List    STOP taking these medications   metroNIDAZOLE 500 MG tablet Commonly known as: FLAGYL   norethindrone 0.35 MG tablet Commonly known as: Derinda Late, CNM 08/04/2018, 7:35 PM

## 2018-08-04 NOTE — Telephone Encounter (Signed)
Called patient to let her know that based on her labwork, Dr Glo Herring is concerned she may have an ectopic pregnancy and needs to see her in our office.  Pt states she is currently at work but will call back.

## 2018-08-04 NOTE — MAU Note (Signed)
Pt sent from office for MTX injection. Pt denies any vag bleeding or abd pain at this time.

## 2018-08-04 NOTE — Discharge Instructions (Signed)
Methotrexate Treatment for an Ectopic Pregnancy, Care After This sheet gives you information about how to care for yourself after your procedure. Your health care provider may also give you more specific instructions. If you have problems or questions, contact your health care provider. What can I expect after the procedure? After the procedure, it is common to have:  Abdominal cramping.  Vaginal bleeding.  Fatigue.  Nausea.  Vomiting.  Diarrhea. Blood tests will be taken at timed intervals for several days or weeks to check your pregnancy hormone levels. The blood tests will be done until the pregnancy hormone can no longer be detected in the blood. Follow these instructions at home: Activity  Do not have sex until your health care provider approves.  Limit activities that take a lot of effort as told by your health care provider. Medicines  Take over the counter and prescription medicines only as told by your health care provider.  Do not take aspirin, ibuprofen, naproxen, or any other NSAIDs.  Do not take folic acid, prenatal vitamins, or other vitamins that contain folic acid. General instructions   Do not drink alcohol.  Follow instructions from your health care provider on how and when to report any symptoms that may indicate a ruptured ectopic pregnancy.  Keep all follow-up visits as told by your health care provider. This is important. Contact a health care provider if:  You have persistent nausea and vomiting.  You have persistent diarrhea.  You are having a reaction to the medicine, such as: ? Tiredness. ? Skin rash. ? Hair loss. Get help right away if:  Your abdominal or pelvic pain gets worse.  You have more vaginal bleeding.  You feel light-headed or you faint.  You have shortness of breath.  Your heart rate increases.  You develop a cough.  You have chills.  You have a fever. Summary  After the procedure, it is common to have  symptoms of abdominal cramping, vaginal bleeding and fatigue. You may also experience other symptoms.  Blood tests will be taken at timed intervals for several days or weeks to check your pregnancy hormone levels. The blood tests will be done until the pregnancy hormone can no longer be detected in the blood.  Limit strenuous activity as told by your health care provider.  Follow instructions from your health care provider on how and when to report any symptoms that may indicate a ruptured ectopic pregnancy. This information is not intended to replace advice given to you by your health care provider. Make sure you discuss any questions you have with your health care provider. Document Released: 01/02/2011 Document Revised: 12/26/2016 Document Reviewed: 03/04/2016 Elsevier Patient Education  [employer]. Methotrexate Treatment for an Ectopic Pregnancy  Methotrexate is a medicine that treats an ectopic pregnancy. An ectopic pregnancy is a pregnancy in which the fetus develops outside the uterus. This kind of pregnancy can be dangerous. Methotrexate works by stopping the growth of the fertilized egg. It also helps your body absorb tissue from the egg. This takes between 2-6 weeks. Most ectopic pregnancies can be successfully treated with methotrexate if they are diagnosed early. Tell a health care provider about:  Any allergies you have.  All medicines you are taking, including vitamins, herbs, eye drops, creams, and over-the-counter medicines.  Any medical conditions you have. What are the risks? Generally, this is a safe treatment. However, problems may occur, including:  Nausea or vomiting or both.  Vaginal bleeding or spotting.  Diarrhea.  Abdominal cramping.  Dizziness or feeling lightheaded.  Mouth sores.  Swelling or irritation of the lining of your lungs (pneumonitis).  Liver damage.  Hair loss. There is a risk that methotrexate treatment will fail and your  pregnancy will continue. There is also a risk that the ectopic pregnancy might rupture while you are using this medicine. What happens before the procedure?  Liver tests, kidney tests, and a complete blood test will be done.  Blood tests will be done to measure the pregnancy hormone levels and to determine your blood type.  If you are Rh-negative and the father is Rh-positive or his Rh type is not known, you will be given a Rho (D) immune globulin shot. What happens during the procedure? Your health care provider may give you methotrexate by injection or in the form of a pill. Methotrexate may be given as a single dose of medicine or a series of doses, depending on your response to the treatment.  Methotrexate injections will be given by your health care provider. This is the most common way that methotrexate is used to treat an ectopic pregnancy.  If you are prescribed oral methotrexate, it is very important that you follow your health care provider's instructions on how to take oral methotrexate. Additional medicines may be needed to manage an ectopic pregnancy. The procedure may vary among health care providers and hospitals. What happens after the procedure?  You may have abdominal cramping, vaginal bleeding, and fatigue.  Blood tests will be taken at timed intervals for several days or weeks to check your pregnancy hormone levels. The blood tests will be done until the pregnancy hormone can no longer be detected in the blood.  You may need to have a surgical procedure to remove the ectopic pregnancy if methotrexate treatment fails.  Follow instructions from your health care provider on how and when to report any symptoms that may indicate a ruptured ectopic pregnancy. Summary  Methotrexate is a medicine that treats an ectopic pregnancy.  Methotrexate may be given in a single dose or a series of doses over time.  Blood tests will be taken at timed intervals for several days or  weeks to check your pregnancy hormone levels. The blood tests will be done until no more pregnancy hormone is detected in the blood.  There is a risk that methotrexate treatment will fail and your pregnancy will continue. There is also a risk that the ectopic pregnancy might rupture while you are using this medicine. This information is not intended to replace advice given to you by your health care provider. Make sure you discuss any questions you have with your health care provider. Document Released: 01/07/2001 Document Revised: 12/26/2016 Document Reviewed: 03/04/2016 Elsevier Patient Education  2020 [employer].

## 2018-08-04 NOTE — Progress Notes (Signed)
Patient ID: Allison Prince, female   DOB: [birth year], 28 y.o.   MRN: [id number]    [facility] Visit  @DATE @            Patient name: Allison Prince MRN [id number]  Date of birth: 1990/06/16  CC & HPI:  Allison Prince is a 28 y.o. female presenting today for follow-up for pregnancy of unknown location, blood levels have stabilized at around 2500 hCG.  With concern for ectopic pregnancy. Concerns of ectopic pregnancy. Is no longer having any bleeding.,  And is having no adnexal pain get some cramping but notes the cramps feel as though  When she is dehydrated not severe cramping Quantitative hCG levels have been followed for pregnancy of unknown location, most recently 2350, noted 7 days after 2526  Ultrasound was done here today which suggests a left adnexal 2.5 cm mass separate from the left ovary.  The endometrium is thin.  Chronic left ectopic is suspected.  Since the pregnancy is known to be nonviable methotrexate will be recommended and patient is being referred to maternity admissions unit for methotrexate therapy.  She will then be referred back here for following serial quantitative hCGs ROS:  ROS -vaginal bleeding +ectopic pregnancy -fever  Pertinent History Reviewed:   Reviewed:  Medical         Past Medical History:  Diagnosis Date   Allergy    Fibroid    Medical history non-contributory                               Surgical Hx:    Past Surgical History:  Procedure Laterality Date   NO PAST SURGERIES     tooth pulled     Medications: Reviewed & Updated - see associated section                       Current Outpatient Medications:    metroNIDAZOLE (FLAGYL) 500 MG tablet, Take 1 tablet (500 mg total) by mouth 2 (two) times daily. (Patient not taking: Reported on 04/01/2018), Disp: 14 tablet, Rfl: 0   norethindrone (MICRONOR,CAMILA,ERRIN) 0.35 MG tablet, Take 1 tablet (0.35 mg total) by mouth daily., Disp: 1 Package, Rfl: 11   Social History:  Reviewed -  reports that she has been smoking cigarettes. She has a 6.00 pack-year smoking history. She has never used smokeless tobacco.  Objective Findings:  Vitals: Last menstrual period 07/15/2018, not currently breastfeeding.  PHYSICAL EXAMINATION General appearance - alert, well appearing, and in no distress Mental status - alert, oriented to person, place, and time, normal mood, behavior, speech, dress, motor activity, and thought processes, affect appropriate to mood  PELVIC Discussion onlyUltrasound was done here today which suggests a left adnexal 2.5 cm mass separate from the left ovary.  The endometrium is thin.  Chronic left ectopic is suspected.    Assessment & Plan:   A:  1. Chronic left ectopic, stable  P:  1. Transvag u/s today in office confirming left adnexal 2-1/2 cm mass 2. Methotrexate recommended explained to patient 3. F/u referred to [facility] cure for ectopic labs and methotrexate    By signing my name below, I, Samul Dada, attest that this documentation has been prepared under the direction and in the presence of Jonnie Kind, MD. Electronically Signed: [place]. 08/04/18. 1:41 PM.  I personally performed the services described in this documentation, which was  SCRIBED in my presence. The recorded information has been reviewed and considered accurate. It has been edited as necessary during review. Jonnie Kind, MD

## 2018-08-04 NOTE — Telephone Encounter (Signed)
Open in error

## 2018-08-04 NOTE — Patient Instructions (Signed)
Ectopic Pregnancy  An ectopic pregnancy happens when a fertilized egg grows outside the womb (uterus). The fertilized egg cannot stay alive outside of the womb. This problem often happens in a fallopian tube. It is often caused by damage to the tube. If this problem is found early, you may be treated with medicine that stops the egg from growing. If your tube tears or bursts open (ruptures), you will bleed inside. Often, there is very bad pain in the lower belly. This is an emergency. You will need surgery. Get help right away. Follow these instructions at home: After being treated with medicine or surgery:  Rest and limit your activity for as long as told by your doctor.  Until your doctor says that it is safe: ? Do not lift anything that is heavier than 10 lb (4.5 kg) or the limit that your doctor tells you. ? Avoid exercise and any movement that takes a lot of effort.  To prevent problems when pooping (constipation): ? Eat a healthy diet. This includes:  Fruits.  Vegetables.  Whole grains. ? Drink 6-8 glasses of water a day. Contact a doctor if: Get help right away if:  You have sudden and very bad pain in your belly.  You have very bad pain in your shoulders or neck.  You have pain that gets worse and is not helped by medicine.  You have: ? A fever or chills. ? Vaginal bleeding. ? Redness or swelling at the site of a surgical cut (incision).  You feel sick to your stomach (nauseous) or you throw up (vomit).  You feel dizzy or weak.  You feel light-headed or you pass out (faint). Summary  An ectopic pregnancy happens when a fertilized egg grows outside the womb (uterus).  If this problem is found early, you may be treated with medicine that stops the egg from growing.  If your tube tears or bursts open (ruptures), you will need surgery. This is an emergency. Get help right away. This information is not intended to replace advice given to you by your health care  provider. Make sure you discuss any questions you have with your health care provider. Document Released: 04/11/2008 Document Revised: 12/26/2016 Document Reviewed: 02/07/2016 Elsevier Patient Education  2020 [employer].

## 2018-08-04 NOTE — Progress Notes (Signed)
PELVIC US YE:[id number] anteverted uterus with mult.fibroids,(#1) posterior subserosal fibroid  1.9 x 1.7 x 2.1 cm,(#2) posterior right intramural fibroid 1.3 x 1 x .8 cm,no IUP visualized,EEC 6.7 mm,normal ovaries bilat, 2.7 x 1.4 x 2.5 cm complex left adnexal mass adjacent to left ovary with color flow,reviewed images with Dr.Ferguson

## 2018-08-07 ENCOUNTER — Inpatient Hospital Stay (HOSPITAL_COMMUNITY)
Admission: AD | Admit: 2018-08-07 | Discharge: 2018-08-07 | Disposition: A | Discharge status: Home / Self Care | Payer: Managed Care, Other (non HMO) | Source: Ambulatory Visit | Attending: Obstetrics and Gynecology | Admitting: Obstetrics and Gynecology

## 2018-08-07 ENCOUNTER — Other Ambulatory Visit: Payer: Self-pay

## 2018-08-07 DIAGNOSIS — Z3A01 Less than 8 weeks gestation of pregnancy: Secondary | ICD-10-CM | POA: Diagnosis not present

## 2018-08-07 DIAGNOSIS — O00102 Left tubal pregnancy without intrauterine pregnancy: Secondary | ICD-10-CM | POA: Diagnosis not present

## 2018-08-07 LAB — HCG, QUANTITATIVE, PREGNANCY: hCG, Beta Chain, Quant, S: 3238 m[IU]/mL — ABNORMAL HIGH (ref <5)

## 2018-08-07 NOTE — MAU Provider Note (Signed)
Ms. Allison Prince  is a 28 y.o. G2P1001  at [redacted]w[redacted]d who presents to [facility]U today for follow-up quant hCG. She is day 4 s/p methotrexate for ectopic pregnancy. She reports mild intermittent abdominal cramping She denies vaginal bleeding.    BP 133/81 (BP Location: Right Arm)   Pulse 78   Temp 98.2 F (36.8 C) (Oral)   Resp 16   LMP 07/15/2018   SpO2 100%   GENERAL: Well-developed, well-nourished female in no acute distress.  HEENT: Normocephalic, atraumatic.   LUNGS: Effort normal HEART: Regular rate  SKIN: Warm, dry and without erythema PSYCH: Normal mood and affect  Component     Latest Ref Rng & Units 08/04/2018 08/07/2018  HCG, Beta Chain, Quant, S     <5 mIU/mL 3,318 (H)  MTX given 3,238 (H)  Day 4    A: 1. Left tubal pregnancy without intrauterine pregnancy     P: Discharge home Ectopic precautions discussed Msg to [facility] for day 7 HCG on Tuesday   Jorje Guild, NP  08/07/2018 3:22 PM

## 2018-08-07 NOTE — MAU Note (Signed)
Allison Prince is a 28 y.o. at [redacted]w[redacted]d here in MAU reporting: post MTX day 4, here for follow up hcg, having some mild cramping but no bleeding.  Pain score: 2/10  Vitals:   08/07/18 1303  BP: 133/81  Pulse: 78  Resp: 16  Temp: 98.2 F (36.8 C)  SpO2: 100%      Lab orders placed from triage: hcg

## 2018-08-07 NOTE — Discharge Instructions (Signed)
Methotrexate Treatment for an Ectopic Pregnancy, Care After This sheet gives you information about how to care for yourself after your procedure. Your health care provider may also give you more specific instructions. If you have problems or questions, contact your health care provider. What can I expect after the procedure? After the procedure, it is common to have:  Abdominal cramping.  Vaginal bleeding.  Fatigue.  Nausea.  Vomiting.  Diarrhea. Blood tests will be taken at timed intervals for several days or weeks to check your pregnancy hormone levels. The blood tests will be done until the pregnancy hormone can no longer be detected in the blood. Follow these instructions at home: Activity  Do not have sex until your health care provider approves.  Limit activities that take a lot of effort as told by your health care provider. Medicines  Take over the counter and prescription medicines only as told by your health care provider.  Do not take aspirin, ibuprofen, naproxen, or any other NSAIDs.  Do not take folic acid, prenatal vitamins, or other vitamins that contain folic acid. General instructions   Do not drink alcohol.  Follow instructions from your health care provider on how and when to report any symptoms that may indicate a ruptured ectopic pregnancy.  Keep all follow-up visits as told by your health care provider. This is important. Contact a health care provider if:  You have persistent nausea and vomiting.  You have persistent diarrhea.  You are having a reaction to the medicine, such as: ? Tiredness. ? Skin rash. ? Hair loss. Get help right away if:  Your abdominal or pelvic pain gets worse.  You have more vaginal bleeding.  You feel light-headed or you faint.  You have shortness of breath.  Your heart rate increases.  You develop a cough.  You have chills.  You have a fever. Summary  After the procedure, it is common to have symptoms  of abdominal cramping, vaginal bleeding and fatigue. You may also experience other symptoms.  Blood tests will be taken at timed intervals for several days or weeks to check your pregnancy hormone levels. The blood tests will be done until the pregnancy hormone can no longer be detected in the blood.  Limit strenuous activity as told by your health care provider.  Follow instructions from your health care provider on how and when to report any symptoms that may indicate a ruptured ectopic pregnancy. This information is not intended to replace advice given to you by your health care provider. Make sure you discuss any questions you have with your health care provider. Document Released: 01/02/2011 Document Revised: 12/26/2016 Document Reviewed: 03/04/2016 Elsevier Patient Education  2020 [employer].

## 2018-08-07 NOTE — MAU Note (Signed)
Pt discharged by Purcell Nails NP

## 2018-08-10 ENCOUNTER — Other Ambulatory Visit: Payer: Self-pay

## 2018-08-10 ENCOUNTER — Other Ambulatory Visit: Payer: Managed Care, Other (non HMO)

## 2018-08-10 DIAGNOSIS — O00102 Left tubal pregnancy without intrauterine pregnancy: Secondary | ICD-10-CM

## 2018-08-11 LAB — BETA HCG QUANT (REF LAB): hCG Quant: 1713 m[IU]/mL

## 2018-08-16 ENCOUNTER — Other Ambulatory Visit: Payer: Self-pay

## 2018-08-16 ENCOUNTER — Other Ambulatory Visit: Payer: Managed Care, Other (non HMO) | Admitting: *Deleted

## 2018-08-16 ENCOUNTER — Other Ambulatory Visit: Payer: Self-pay | Admitting: Obstetrics and Gynecology

## 2018-08-16 DIAGNOSIS — O00102 Left tubal pregnancy without intrauterine pregnancy: Secondary | ICD-10-CM

## 2018-08-16 MED ORDER — METRONIDAZOLE 500 MG PO TABS: 500.0000 mg | ORAL_TABLET | Freq: Two times a day (BID) | ORAL | 0 refills | Status: DC

## 2018-08-16 NOTE — Progress Notes (Signed)
Ordered Q HCG x 3 on weekly basis.

## 2018-08-16 NOTE — Progress Notes (Signed)
Metronidazole Rx sent in for presumed BV.

## 2018-08-17 LAB — BETA HCG QUANT (REF LAB): hCG Quant: 954 m[IU]/mL

## 2018-08-23 ENCOUNTER — Other Ambulatory Visit: Payer: Self-pay

## 2018-08-23 ENCOUNTER — Other Ambulatory Visit: Payer: Managed Care, Other (non HMO)

## 2018-08-23 DIAGNOSIS — O039 Complete or unspecified spontaneous abortion without complication: Secondary | ICD-10-CM

## 2018-08-24 LAB — BETA HCG QUANT (REF LAB): hCG Quant: 521 m[IU]/mL

## 2018-08-31 ENCOUNTER — Telehealth: Payer: Self-pay | Admitting: Obstetrics and Gynecology

## 2018-08-31 NOTE — Telephone Encounter (Signed)
Pt checking to see if the order for her HCG level to be rechecked will be in before Monday? She will go to the lab then.

## 2018-08-31 NOTE — Telephone Encounter (Signed)
Order will be put in the Friday before the appointment. Appt scheduled.

## 2018-09-06 ENCOUNTER — Other Ambulatory Visit: Payer: Self-pay

## 2018-09-06 ENCOUNTER — Other Ambulatory Visit: Payer: Managed Care, Other (non HMO)

## 2018-09-06 DIAGNOSIS — O00102 Left tubal pregnancy without intrauterine pregnancy: Secondary | ICD-10-CM

## 2018-09-07 ENCOUNTER — Other Ambulatory Visit: Payer: Self-pay

## 2018-09-07 ENCOUNTER — Other Ambulatory Visit: Payer: Managed Care, Other (non HMO)

## 2018-09-08 LAB — BETA HCG QUANT (REF LAB): hCG Quant: 7 m[IU]/mL

## 2018-09-10 ENCOUNTER — Telehealth: Payer: Self-pay | Admitting: Obstetrics and Gynecology

## 2018-09-10 NOTE — Telephone Encounter (Signed)
Pt states that Dr. Glo Herring prescribed her flagyl and she states this week she has been having cramping and rectal pain. Requesting to speak with a nurse.

## 2018-09-10 NOTE — Telephone Encounter (Signed)
Pt recently was pregnant, ectopic and was given Methotrexate. Pt did bleed some after Methotrexate in June. Pt had really bad cramping last week. Pt also had rectal pain last week also. Pt don't feel constipated. Pt noticed some rectal bleeding this am when wiping. Advised she needs an appt. Pt voiced understanding. Call transferred to front desk for appt. [place]

## 2018-09-11 ENCOUNTER — Emergency Department (HOSPITAL_COMMUNITY)
Admission: EM | Admit: 2018-09-11 | Discharge: 2018-09-11 | Disposition: A | Discharge status: Home / Self Care | Payer: Managed Care, Other (non HMO) | Attending: Emergency Medicine | Admitting: Emergency Medicine

## 2018-09-11 ENCOUNTER — Emergency Department (HOSPITAL_COMMUNITY): Payer: Managed Care, Other (non HMO)

## 2018-09-11 ENCOUNTER — Encounter (HOSPITAL_COMMUNITY): Payer: Self-pay | Admitting: Emergency Medicine

## 2018-09-11 ENCOUNTER — Other Ambulatory Visit: Payer: Self-pay

## 2018-09-11 DIAGNOSIS — R1032 Left lower quadrant pain: Secondary | ICD-10-CM | POA: Diagnosis not present

## 2018-09-11 DIAGNOSIS — R109 Unspecified abdominal pain: Secondary | ICD-10-CM

## 2018-09-11 DIAGNOSIS — M62838 Other muscle spasm: Secondary | ICD-10-CM | POA: Diagnosis not present

## 2018-09-11 DIAGNOSIS — F17210 Nicotine dependence, cigarettes, uncomplicated: Secondary | ICD-10-CM | POA: Diagnosis not present

## 2018-09-11 DIAGNOSIS — R102 Pelvic and perineal pain: Secondary | ICD-10-CM | POA: Diagnosis present

## 2018-09-11 LAB — CBC WITH DIFFERENTIAL/PLATELET
Abs Immature Granulocytes: 0.03 10*3/uL (ref 0.00–0.07)
Basophils Absolute: 0 10*3/uL (ref 0.0–0.1)
Basophils Relative: 0 %
Eosinophils Absolute: 0.2 10*3/uL (ref 0.0–0.5)
Eosinophils Relative: 3 %
HCT: 38 % (ref 36.0–46.0)
Hemoglobin: 12 g/dL (ref 12.0–15.0)
Immature Granulocytes: 0 %
Lymphocytes Relative: 32 %
Lymphs Abs: 2.6 10*3/uL (ref 0.7–4.0)
MCH: 28 pg (ref 26.0–34.0)
MCHC: 31.6 g/dL (ref 30.0–36.0)
MCV: 88.8 fL (ref 80.0–100.0)
Monocytes Absolute: 0.6 10*3/uL (ref 0.1–1.0)
Monocytes Relative: 7 %
Neutro Abs: 4.7 10*3/uL (ref 1.7–7.7)
Neutrophils Relative %: 58 %
Platelets: 347 10*3/uL (ref 150–400)
RBC: 4.28 MIL/uL (ref 3.87–5.11)
RDW: 14.6 % (ref 11.5–15.5)
WBC: 8.2 10*3/uL (ref 4.0–10.5)
nRBC: 0 % (ref 0.0–0.2)

## 2018-09-11 LAB — URINALYSIS, ROUTINE W REFLEX MICROSCOPIC
Bilirubin Urine: NEGATIVE
Glucose, UA: NEGATIVE mg/dL
Ketones, ur: NEGATIVE mg/dL
Leukocytes,Ua: NEGATIVE
Nitrite: NEGATIVE
Protein, ur: NEGATIVE mg/dL
Specific Gravity, Urine: 1.018 (ref 1.005–1.030)
pH: 6 (ref 5.0–8.0)

## 2018-09-11 LAB — COMPREHENSIVE METABOLIC PANEL
ALT: 14 U/L (ref 0–44)
AST: 14 U/L — ABNORMAL LOW (ref 15–41)
Albumin: 4.1 g/dL (ref 3.5–5.0)
Alkaline Phosphatase: 32 U/L — ABNORMAL LOW (ref 38–126)
Anion gap: 7 (ref 5–15)
BUN: 10 mg/dL (ref 6–20)
CO2: 26 mmol/L (ref 22–32)
Calcium: 9.3 mg/dL (ref 8.9–10.3)
Chloride: 106 mmol/L (ref 98–111)
Creatinine, Ser: 0.58 mg/dL (ref 0.44–1.00)
GFR calc Af Amer: >60 mL/min (ref >60)
GFR calc non Af Amer: >60 mL/min (ref >60)
Glucose, Bld: 89 mg/dL (ref 70–99)
Potassium: 3.8 mmol/L (ref 3.5–5.1)
Sodium: 139 mmol/L (ref 135–145)
Total Bilirubin: 0.5 mg/dL (ref 0.3–1.2)
Total Protein: 7.1 g/dL (ref 6.5–8.1)

## 2018-09-11 LAB — HCG, QUANTITATIVE, PREGNANCY: hCG, Beta Chain, Quant, S: 3 m[IU]/mL

## 2018-09-11 LAB — LIPASE, BLOOD: Lipase: 27 U/L (ref 11–51)

## 2018-09-11 LAB — POC OCCULT BLOOD, ED: Fecal Occult Bld: NEGATIVE

## 2018-09-11 MED ORDER — NAPROXEN 500 MG PO TABS: 500.0000 mg | ORAL_TABLET | Freq: Two times a day (BID) | ORAL | 0 refills | Status: DC

## 2018-09-11 MED ORDER — ONDANSETRON HCL 4 MG/2ML IJ SOLN
4.0000 mg | Freq: Once | INTRAMUSCULAR | Status: AC
  Administered 2018-09-11: 4 mg via INTRAVENOUS
  Filled 2018-09-11: qty 2

## 2018-09-11 MED ORDER — METHOCARBAMOL 500 MG PO TABS: 500.0000 mg | ORAL_TABLET | Freq: Two times a day (BID) | ORAL | 0 refills | Status: DC | PRN

## 2018-09-11 MED ORDER — IOHEXOL 300 MG/ML  SOLN
100.0000 mL | Freq: Once | INTRAMUSCULAR | Status: AC | PRN
  Administered 2018-09-11: 19:00:00 100 mL via INTRAVENOUS

## 2018-09-11 MED ORDER — KETOROLAC TROMETHAMINE 30 MG/ML IJ SOLN
15.0000 mg | Freq: Once | INTRAMUSCULAR | Status: AC
  Administered 2018-09-11: 15 mg via INTRAVENOUS
  Filled 2018-09-11: qty 1

## 2018-09-11 NOTE — ED Triage Notes (Addendum)
Pt recently had a Methotrexate shot for a ectopic pregnancy.  Now c/o of abdominal pain that radiated to her rectum with nausea.  Called OB and has an appt next week

## 2018-09-11 NOTE — Consult Note (Addendum)
Asked to review CT of this 28 yr female well known to our office providers due to recent treatment with Methotrexate for left ectopic pregnancy,  With appropriate decline in HCG levels from 3,318 on July 8, until  HCG of seven (7) noted 8/11. HCG today is 3 She now has rectal pain and left adnexal pain. CT shows some residual fullness in left adnexa,    Reproductive: The uterus is anteverted. Several small fundal fibroids noted. The ovaries are grossly unremarkable. There is a somewhat ill-defined 3.3 x 2.7 x 3.8 cm ovoid structure in the left posterior pelvis posteromedial to the left ovary corresponding to the known ectopic pregnancy.  Other: Small fat containing umbilical hernia  Musculoskeletal: No acute or significant osseous findings.  IMPRESSION: 1. Ill-defined ovoid mass in the left posterior hemipelvis in keeping with known ectopic pregnancy. No large pelvic hematoma. 2. Multiple enhancing liver lesions are not characterized but may represent small hemangiomata or adenoma given the patient's age. Further characterization with MRI without and with contrast on a nonemergent basis recommended.   Electronically Signed   By: Anner Crete M.D.   On: 09/11/2018 19:58 CBC Latest Ref Rng & Units 09/11/2018 08/04/2018 07/22/2018  WBC 4.0 - 10.5 K/uL 8.2 8.3 11.2(H)  Hemoglobin 12.0 - 15.0 g/dL 12.0 12.2 12.6  Hematocrit 36.0 - 46.0 % 38.0 36.6 39.6  Platelets 150 - 400 K/uL 347 356 342    There is no free fluid, so I do not suspect any late internal bleeding . At this point is suspect we are seeing some residual thickening of the left tube from the ectopic, but I feel her body is reabsorbing the ectopic appropriately And this can be treated symptomatically , with NSAID's and /or Tramadol, and outpatient f/u.  She has an Appt with me next week in office.   Jonnie Kind, MD  09/11/2018

## 2018-09-11 NOTE — ED Provider Notes (Signed)
[facility] EMERGENCY DEPARTMENT Provider Note   CSN: [id number] Arrival date & time: 09/11/18  1533     History   Chief Complaint Chief Complaint  Patient presents with   Abdominal Pain    HPI Allison Prince is a 28 y.o. female presenting for evaluation of abdominal pain.  Patient states she had an ectopic pregnancy for which she got methotrexate at the beginning of July.  Since then, she has had persistent left lower quadrant cramping.  2 weeks ago, she developed a.  And had increase in her cramping.  Her period ended last week, but since then she has been having intermittent sharp spasms that go to her rectum and continued left lower quadrant cramping ache pain.  She has been taking Tylenol intermittently, which mildly improves her symptoms.  She called her OB/GYN, has an appointment scheduled for next week. She has associated nausea without vomiting.  She denies fevers, chills, urinary symptoms, vaginal bleeding, vaginal discharge, abnormal bowel movements.  Patient states she thought she was constipated because when she strains the pain increases, but she took a laxative today, had a bowel movement, and no improvement of her symptoms.  Patient states she had one episode of blood in her stool this week. She reports no other medical problems, takes no medications daily.     HPI  Past Medical History:  Diagnosis Date   Allergy    Fibroid    Medical history non-contributory     Patient Active Problem List   Diagnosis Date Noted   Abnormal Pap smear of cervix 03/23/2018   Postpartum hypertension 02/27/2017   Smoker 06/25/2016   Uterine fibroids 06/25/2016   Tobacco use disorder 02/08/2014    Past Surgical History:  Procedure Laterality Date   NO PAST SURGERIES     tooth pulled       OB History    Gravida  2   Para  1   Term  1   Preterm      AB      Living  1     SAB      TAB      Ectopic      Multiple  0   Live Births  1              Home Medications    Prior to Admission medications   Medication Sig Start Date End Date Taking? Authorizing Provider  acetaminophen (TYLENOL) 500 MG tablet Take 500 mg by mouth every 6 (six) hours as needed for mild pain or moderate pain.   Yes [provider]  methocarbamol (ROBAXIN) 500 MG tablet Take 1 tablet (500 mg total) by mouth 2 (two) times daily as needed for muscle spasms. 09/11/18   Ariyanah Aguado, PA-C  metroNIDAZOLE (FLAGYL) 500 MG tablet Take 1 tablet (500 mg total) by mouth 2 (two) times daily. Patient not taking: Reported on 09/11/2018 08/16/18   Jonnie Kind, MD  naproxen (NAPROSYN) 500 MG tablet Take 1 tablet (500 mg total) by mouth 2 (two) times daily with a meal. 09/11/18   Dyan Labarbera, PA-C    Family History Family History  Problem Relation Age of Onset   COPD Mother    Depression Mother    Diabetes Mother    Hyperlipidemia Mother    Hypertension Mother    Asthma Brother    Arthritis Paternal Grandfather     Social History Social History   Tobacco Use   Smoking status: Current Every Day  Smoker    Packs/day: 1.00    Years: 6.00    Pack years: 6.00    Types: Cigarettes   Smokeless tobacco: Never Used   Tobacco comment: 1-2 a day  Substance Use Topics   Alcohol use: Yes    Alcohol/week: 2.0 standard drinks    Types: 2 Shots of liquor per week    Comment: occ   Drug use: No     Allergies   Latex and Asa [aspirin]   Review of Systems Review of Systems  Genitourinary: Positive for pelvic pain.  All other systems reviewed and are negative.    Physical Exam Updated Vital Signs BP (!) 176/97 (BP Location: Right Arm)    Pulse 67    Temp 98.7 F (37.1 C) (Oral)    Resp 18    Ht 5\' 2"  (1.575 m)    Wt 73.5 kg    LMP 09/08/2018    SpO2 100%    Breastfeeding Unknown    BMI 29.63 kg/m   Physical Exam Vitals signs and nursing note reviewed. Exam conducted with a chaperone present.  Constitutional:      General:  She is not in acute distress.    Appearance: She is well-developed.     Comments: Resting comfortably in the bed in no acute distress  HENT:     Head: Normocephalic and atraumatic.  Eyes:     Conjunctiva/sclera: Conjunctivae normal.     Pupils: Pupils are equal, round, and reactive to light.  Neck:     Musculoskeletal: Normal range of motion and neck supple.  Cardiovascular:     Rate and Rhythm: Normal rate and regular rhythm.     Pulses: Normal pulses.  Pulmonary:     Effort: Pulmonary effort is normal. No respiratory distress.     Breath sounds: Normal breath sounds. No wheezing.  Abdominal:     General: There is no distension.     Palpations: Abdomen is soft. There is no mass.     Tenderness: There is abdominal tenderness. There is no guarding or rebound.     Comments: Tenderness palpation of left lower quadrant.  No rigidity, guarding, distention.  Negative rebound.  No signs of peritonitis.  Genitourinary:    Rectum: Normal. Guaiac result negative.     Comments: No obvious hemorrhoids.  No obvious rectal bleeding.  Hemoccult negative. Musculoskeletal: Normal range of motion.  Skin:    General: Skin is warm and dry.     Capillary Refill: Capillary refill takes less than 2 seconds.  Neurological:     Mental Status: She is alert and oriented to person, place, and time.      ED Treatments / Results  Labs (all labs ordered are listed, but only abnormal results are displayed) Labs Reviewed  COMPREHENSIVE METABOLIC PANEL - Abnormal; Notable for the following components:      Result Value   AST 14 (*)    Alkaline Phosphatase 32 (*)    All other components within normal limits  URINALYSIS, ROUTINE W REFLEX MICROSCOPIC - Abnormal; Notable for the following components:   Hgb urine dipstick SMALL (*)    Bacteria, UA RARE (*)    All other components within normal limits  CBC WITH DIFFERENTIAL/PLATELET  HCG, QUANTITATIVE, PREGNANCY  LIPASE, BLOOD  POC OCCULT BLOOD, ED     EKG None  Radiology Ct Abdomen Pelvis W Contrast  Result Date: 09/11/2018 CLINICAL DATA:  28 year old female with recent methotrexate trauma for ectopic pregnancy complaining for intermittent bilateral  abdominal pain and nausea. EXAM: CT ABDOMEN AND PELVIS WITH CONTRAST TECHNIQUE: Multidetector CT imaging of the abdomen and pelvis was performed using the standard protocol following bolus administration of intravenous contrast. CONTRAST:  114mL OMNIPAQUE IOHEXOL 300 MG/ML  SOLN COMPARISON:  Obstetrical ultrasound dated 08/04/2018 FINDINGS: Lower chest: The visualized lung bases are clear. Intra-abdominal free air. Trace free fluid may be present within the pelvis. Hepatobiliary: Multiple enhancing liver lesions measuring up to 2.6 x 2.7 cm in the left lobe of the liver are not characterized but may represent small hemangiomata or adenoma given the patient's age. Further characterization with MRI without and with contrast on a nonemergent basis recommended. Ill-defined low attenuation in the inferior right lobe of the liver (series 2, image 29) is also not characterized. There is no intrahepatic biliary ductal dilatation. The gallbladder is unremarkable. Pancreas: Unremarkable. No pancreatic ductal dilatation or surrounding inflammatory changes. Spleen: Normal in size without focal abnormality. Adrenals/Urinary Tract: The adrenal glands are unremarkable. The kidneys, visualized ureters, and urinary bladder appear unremarkable as well. Stomach/Bowel: There is no bowel obstruction or active inflammation. The appendix is normal. Vascular/Lymphatic: The abdominal aorta and IVC are unremarkable. No portal venous gas. There is no adenopathy. Reproductive: The uterus is anteverted. Several small fundal fibroids noted. The ovaries are grossly unremarkable. There is a somewhat ill-defined 3.3 x 2.7 x 3.8 cm ovoid structure in the left posterior pelvis posteromedial to the left ovary corresponding to the known  ectopic pregnancy. Other: Small fat containing umbilical hernia Musculoskeletal: No acute or significant osseous findings. IMPRESSION: 1. Ill-defined ovoid mass in the left posterior hemipelvis in keeping with known ectopic pregnancy. No large pelvic hematoma. 2. Multiple enhancing liver lesions are not characterized but may represent small hemangiomata or adenoma given the patient's age. Further characterization with MRI without and with contrast on a nonemergent basis recommended. Electronically Signed   By: Anner Crete M.D.   On: 09/11/2018 19:58    Procedures Procedures (including critical care time)  Medications Ordered in ED Medications  ketorolac (TORADOL) 30 MG/ML injection 15 mg (15 mg Intravenous Given 09/11/18 1754)  ondansetron (ZOFRAN) injection 4 mg (4 mg Intravenous Given 09/11/18 1752)  iohexol (OMNIPAQUE) 300 MG/ML solution 100 mL (100 mLs Intravenous Contrast Given 09/11/18 1925)     Initial Impression / Assessment and Plan / ED Course  I have reviewed the triage vital signs and the nursing notes.  Pertinent labs & imaging results that were available during my care of the patient were reviewed by me and considered in my medical decision making (see chart for details).        Patient presenting for evaluation of abdominal cramping and spasms going from her abdomen to her rectum.  Physical examination, she appears nontoxic.  Left lower quadrant tenderness is on exam.  As patient reports spasms going to her rectum and an episode of rectal bleeding, will perform rectal exam.  No gross blood noted on exam.  Pain is not elicited by rectal exam, but was present just following the exam. Hemoccult negative.  No signs of hemorrhoids.  As such, will obtain labs including hCG quant, urine, and CT abdomen pelvis for further evaluation.  Toradol and Zofran for symptom control.  Labs reassuring, no leukocytosis.  Kidney, liver, pancreatic function reassuring.  CT pending.  CT shows  left-sided ovoid mass consistent with known ectopic.  Will consult with OB/GYN.  Discussed with Dr. Glo Herring from OB/GYN, who reviewed the images.  As there is no signs of  hemorrhage, and hCG quant is 3, likely tissue from ectopic that is being reabsorbed but is no longer ectopic tissue.  Recommends symptomatic treatment and follow-up as needed.  On reassessment, patient reports pain is improved with Toradol.  Discussed symptomatic treatment with NSAIDs.  Also likely given for spasm, as this may be irritating her gut or surrounding musculature. Discussed use of heating pads. At this time, pt appears safe for d/c. Return precautions given. Pt states she understands and agrees to plan.   Final Clinical Impressions(s) / ED Diagnoses   Final diagnoses:  Left sided abdominal pain  Abdominal spasms    ED Discharge Orders         Ordered    naproxen (NAPROSYN) 500 MG tablet  2 times daily with meals     09/11/18 2046    methocarbamol (ROBAXIN) 500 MG tablet  2 times daily PRN     09/11/18 2046           Franchot Heidelberg, PA-C 09/11/18 2108    Milton Ferguson, MD 09/14/18 1257

## 2018-09-11 NOTE — Discharge Instructions (Addendum)
Take naproxen 2 times a day with meals.  Do not take other anti-inflammatories at the same time (Advil, Motrin, ibuprofen, Aleve). You may supplement with Tylenol if you need further pain control. Use robaxin as needed for spasm, have caution, this may make you tired or groggry.do not drive until you know how this medicine effects you.  Use heating pads to help control your pain. Follow-up with Dr. Glo Herring at your scheduled appointment. Return to the emergency room if you develop high fevers, vomiting, severe worsening pain, or any new, worsening, concerning symptoms.

## 2018-09-22 ENCOUNTER — Other Ambulatory Visit: Payer: Self-pay

## 2018-09-22 ENCOUNTER — Ambulatory Visit (INDEPENDENT_AMBULATORY_CARE_PROVIDER_SITE_OTHER): Payer: Managed Care, Other (non HMO) | Admitting: Student

## 2018-09-22 ENCOUNTER — Encounter: Payer: Self-pay | Admitting: Student

## 2018-09-22 DIAGNOSIS — O00102 Left tubal pregnancy without intrauterine pregnancy: Secondary | ICD-10-CM | POA: Diagnosis not present

## 2018-09-22 DIAGNOSIS — R1032 Left lower quadrant pain: Secondary | ICD-10-CM | POA: Diagnosis not present

## 2018-09-22 NOTE — Patient Instructions (Addendum)
-Follow up with PCP if you continue to have rectal pain/rectal bleeding.  -Use condoms to avoid pregnancy until we can place your IUD.    Intrauterine Device Information An intrauterine device (IUD) is a medical device that is inserted in the uterus to prevent pregnancy. It is a small, T-shaped device that has one or two nylon strings hanging down from it. The strings hang out of the lower part of the uterus (cervix) to allow for future IUD removal. There are two types of IUDs available:  Hormone IUD. This type of IUD is made of plastic and contains the hormone progestin (synthetic progesterone). A hormone IUD may last 3-5 years.  Copper IUD. This type of IUD has copper wire wrapped around it. A copper IUD may last up to 10 years. How is an IUD inserted? An IUD is inserted through the vagina and placed into the uterus with a minor medical procedure. The exact procedure for IUD insertion may vary among health care providers and hospitals. How does an IUD work? Synthetic progesterone in a hormonal IUD prevents pregnancy by:  Thickening cervical mucus to prevent sperm from entering the uterus.  Thinning the uterine lining to prevent a fertilized egg from being implanted there. Copper in a copper IUD prevents pregnancy by making the uterus and fallopian tubes produce a fluid that kills sperm. What are the advantages of an IUD? Advantages of either type of IUD  It is highly effective in preventing pregnancy.  It is reversible. You can become pregnant shortly after the IUD is removed.  It is low-maintenance and can stay in place for a long time.  There are no estrogen-related side effects.  It can be used when breastfeeding.  It is not associated with weight gain.  It can be inserted right after childbirth, an abortion, or a miscarriage. Advantages of a hormone IUD  If it is inserted within 7 days of your period starting, it works right after it is inserted. If the hormone IUD is  inserted at any other time in your cycle, you will need to use a backup method of birth control for 7 days after insertion.  It can make menstrual periods lighter.  It can reduce menstrual cramping.  It can be used for 3-5 years. Advantages of a copper IUD  It works right after it is inserted.  It can be used as a form of emergency birth control if it is inserted within 5 days after having unprotected sex.  It does not interfere with your body's natural hormones.  It can be used for 10 years. What are the disadvantages of an IUD?  An IUD may cause irregular menstrual bleeding for a period of time after insertion.  You may have pain during insertion and have cramping and vaginal bleeding after insertion.  An IUD may cut the uterus (uterine perforation) when it is inserted. This is rare.  An IUD may cause pelvic inflammatory disease (PID), which is an infection in the uterus and fallopian tubes. This is rare, and it usually happens during the first 20 days after the IUD is inserted.  A copper IUD can make your menstrual flow heavier and more painful. How is an IUD removed?  You will lie on your back with your knees bent and your feet in footrests (stirrups).  A device will be inserted into your vagina to spread apart the vaginal walls (speculum). This will allow your health care provider to see the strings attached to the IUD.  Your  health care provider will use a small instrument (forceps) to grasp the IUD strings and pull firmly until the IUD is removed. You may have some discomfort when the IUD is removed. Your health care provider may recommend taking over-the-counter pain relievers, such as ibuprofen, before the procedure. You may also have minor spotting for a few days after the procedure. The exact procedure for IUD removal may vary among health care providers and hospitals. Is the IUD right for me? Your health care provider will make sure you are a good candidate for an IUD  and will discuss the advantages, disadvantages, and possible side effects with you. Summary  An intrauterine device (IUD) is a medical device that is inserted in the uterus to prevent pregnancy. It is a small, T-shaped device that has one or two nylon strings hanging down from it.  A hormone IUD contains the hormone progestin (synthetic progesterone). A copper IUD has copper wire wrapped around it.  Synthetic progesterone in a hormone IUD prevents pregnancy by thickening cervical mucus and thinning the walls of the uterus. Copper in a copper IUD prevents pregnancy by making the uterus and fallopian tubes produce a fluid that kills sperm.  A hormone IUD can be left in place for 3-5 years. A copper IUD can be left in place for up to 10 years.  An IUD is inserted and removed by a health care provider. You may feel some pain during insertion and removal. Your health care provider may recommend taking over-the-counter pain medicine, such as ibuprofen, before an IUD procedure. This information is not intended to replace advice given to you by your health care provider. Make sure you discuss any questions you have with your health care provider. Document Released: 12/18/2003 Document Revised: 12/26/2016 Document Reviewed: 02/12/2016 Elsevier Patient Education  2020 [employer].

## 2018-09-22 NOTE — Progress Notes (Signed)
History:  Ms. DELLENA ZEMP is a 28 y.o. G2P1011 who presents to clinic today for follow up from ED visit for abdominal pain. She was diagnosed with an ectopic pregnancy on her left side on 7-8. She received one dose of methotrexate; her lab work shows beta HCG (drawn at [facility]) was 3 on 8-15.    She a period on Aug 5, and then after that she had much stronger cramps.  The cramps radiated to her rectum, and  she sought treatment at [facility] ED. She had a CT scan, normal findings, which were reviewed with Dr. Glo Herring.   She has been taking Naproxen twice a day since the 15th, although some days she missed a dose. She has not been taking her muscle relaxer. She has missed her Naproxen dose yesterday and didn't notice any cramping.   She has hypertension, not on any medication.   The following portions of the patient's history were reviewed and updated as appropriate: allergies, current medications, family history, past medical history, social history, past surgical history and problem list.  Review of Systems:  Review of Systems  Constitutional: Negative.   HENT: Negative.   Cardiovascular: Negative.   Gastrointestinal: Negative.   Genitourinary: Negative.   Skin: Negative.       Objective:  Physical Exam BP (!) 155/86   Ht 5\' 2"  (1.575 m)   Wt 159 lb (72.1 kg)   LMP 09/08/2018 (Exact Date)   Breastfeeding No   BMI 29.08 kg/m  Physical Exam  Constitutional: She appears well-developed.  HENT:  Head: Normocephalic.  Neck: Normal range of motion.  Cardiovascular: Normal rate.  GI: Soft.  Genitourinary:    Vagina normal.     No vaginal discharge.     Genitourinary Comments: NEFG; no blood or discharge in the vagina. No CMT, suprapubic tenderness, adnexal tenderness. Rectal exam benign; no tenderness or masses felt.    Musculoskeletal: Normal range of motion.  Neurological: She is alert.  Skin: Skin is warm.  Psychiatric: She has a normal mood and affect.       Labs and Imaging No results found for this or any previous visit (from the past 24 hour(s)).  No results found.   Assessment & Plan:   1. Left lower quadrant abdominal pain -Recommended patient follow up with PCP if her rectal pain continues; explained that I cannot find any concerning findings gyn-related.  -Patient was on micronor but got pregnant and wants a different method. Patient will come back for IUD placement As she checked in late and we were unable to place.  -Stop taking Naproxen and see if her pain returns; if it does, she should follow up with PCP. Given that bhcg is less than 3, ectopic pregnancy has been successfully treated.   Starr Lake, CNM 09/23/2018 9:05 AM

## 2018-09-23 DIAGNOSIS — O009 Unspecified ectopic pregnancy without intrauterine pregnancy: Secondary | ICD-10-CM | POA: Insufficient documentation

## 2018-09-23 DIAGNOSIS — R109 Unspecified abdominal pain: Secondary | ICD-10-CM | POA: Insufficient documentation

## 2018-09-29 ENCOUNTER — Encounter: Payer: Self-pay | Admitting: Advanced Practice Midwife

## 2018-09-29 ENCOUNTER — Other Ambulatory Visit: Payer: Self-pay

## 2018-09-29 ENCOUNTER — Ambulatory Visit (INDEPENDENT_AMBULATORY_CARE_PROVIDER_SITE_OTHER): Payer: Managed Care, Other (non HMO) | Admitting: Advanced Practice Midwife

## 2018-09-29 VITALS — BP 151/96 | HR 82 | Ht 62.0 in | Wt 160.0 lb

## 2018-09-29 DIAGNOSIS — Z30430 Encounter for insertion of intrauterine contraceptive device: Secondary | ICD-10-CM | POA: Diagnosis not present

## 2018-09-29 DIAGNOSIS — Z3202 Encounter for pregnancy test, result negative: Secondary | ICD-10-CM | POA: Diagnosis not present

## 2018-09-29 LAB — POCT URINE PREGNANCY: Preg Test, Ur: NEGATIVE

## 2018-09-29 MED ORDER — LEVONORGESTREL 19.5 MCG/DAY IU IUD
INTRAUTERINE_SYSTEM | Freq: Once | INTRAUTERINE | Status: AC
  Administered 2018-09-29: 17:00:00 via INTRAUTERINE

## 2018-09-29 NOTE — Progress Notes (Signed)
Allison Prince is a 28 y.o. year old  female   who presents for placement of a Liletta IUD. She was just successfully treated for ectopic pg and her pregnancy test today is negative.    The risks and benefits of the method and placement have been thouroughly reviewed with the patient and all questions were answered.  Specifically the patient is aware of failure rate of 01/998, expulsion of the IUD and of possible perforation.  The patient is aware of irregular bleeding due to the method and understands the incidence of irregular bleeding diminishes with time.  Time out was performed.  A Graves speculum was placed.  The cervix was prepped using Betadine. The uterus was found to be neutral and it sounded to 8 cm.  The cervix was grasped with a tenaculum and the IUD was inserted to 8 cm.  It was pulled back 1 cm and the IUD was disengaged.  The strings were trimmed to 3 cm.  Sonogram was performed and the proper placement of the IUD was verified.  The patient was instructed on signs and symptoms of infection and to check for the strings after each menses or each month.  The patient is to refrain from intercourse for 3 days.  The patient is scheduled for a return appointment after her first menses or 4 weeks.  Christin Fudge 09/29/2018 4:35 PM

## 2018-09-29 NOTE — Addendum Note (Signed)
Addended by: Diona Fanti A on: 09/29/2018 05:23 PM   Modules accepted: Orders

## 2018-10-28 ENCOUNTER — Other Ambulatory Visit: Payer: Managed Care, Other (non HMO)

## 2018-10-28 ENCOUNTER — Encounter: Payer: Self-pay | Admitting: Advanced Practice Midwife

## 2018-10-28 ENCOUNTER — Ambulatory Visit (INDEPENDENT_AMBULATORY_CARE_PROVIDER_SITE_OTHER): Payer: Managed Care, Other (non HMO) | Admitting: Advanced Practice Midwife

## 2018-10-28 ENCOUNTER — Other Ambulatory Visit: Payer: Self-pay

## 2018-10-28 VITALS — BP 133/94 | HR 83 | Ht 62.0 in | Wt 159.0 lb

## 2018-10-28 DIAGNOSIS — Z30431 Encounter for routine checking of intrauterine contraceptive device: Secondary | ICD-10-CM | POA: Diagnosis not present

## 2018-10-28 NOTE — Progress Notes (Signed)
History:  28 y.o. G2P1011 here today for today for IUD string check; Liletta IUD was placed  09/29/18. No complaints about the IUD, no concerning side effects. Had a period a few days after insertion that lasted a few days longer than usual, no bleeding since then.   The following portions of the patient's history were reviewed and updated as appropriate: allergies, current medications, past family history, past medical history, past social history, past surgical history and problem list.  Review of Systems:   Constitutional: Negative for fever and chills Eyes: Negative for visual disturbances Respiratory: Negative for shortness of breath, dyspnea Cardiovascular: Negative for chest pain or palpitations  Gastrointestinal: Negative for vomiting, diarrhea and constipation Genitourinary: Negative for dysuria and urgency Musculoskeletal: Negative for back pain, joint pain, myalgias  Neurological: Negative for dizziness and headaches    Objective:  Physical Exam Height 5\' 2"  (1.575 m), weight 159 lb (72.1 kg), last menstrual period 10/03/2018, not currently breastfeeding. Gen: NAD Abd: Soft, nontender and nondistended Pelvic:Strings not visible w/SSE but properly placed IUD identified by [employer] w/her [place] machine.    Assessment & Plan:  Normal IUD check. Patient to keep IUD in place for 6-8 years; can come in for removal if she desires pregnancy.   Told pt to ask for cytotec per removal!

## 2018-11-17 NOTE — Progress Notes (Signed)
Patient only had labs placed and was not seen in the office on 08/16/18.

## 2019-02-04 ENCOUNTER — Other Ambulatory Visit: Payer: Self-pay

## 2019-02-04 ENCOUNTER — Ambulatory Visit: Payer: Managed Care, Other (non HMO) | Attending: Internal Medicine

## 2019-02-04 DIAGNOSIS — Z20822 Contact with and (suspected) exposure to covid-19: Secondary | ICD-10-CM

## 2019-02-06 LAB — NOVEL CORONAVIRUS, NAA: SARS-CoV-2, NAA: NOT DETECTED

## 2019-02-28 ENCOUNTER — Other Ambulatory Visit: Payer: Self-pay

## 2019-02-28 ENCOUNTER — Encounter (HOSPITAL_COMMUNITY): Payer: Self-pay | Admitting: Emergency Medicine

## 2019-02-28 ENCOUNTER — Emergency Department (HOSPITAL_COMMUNITY)
Admission: EM | Admit: 2019-02-28 | Discharge: 2019-02-28 | Disposition: A | Discharge status: Home / Self Care | Payer: Managed Care, Other (non HMO) | Attending: Emergency Medicine | Admitting: Emergency Medicine

## 2019-02-28 DIAGNOSIS — Z975 Presence of (intrauterine) contraceptive device: Secondary | ICD-10-CM | POA: Insufficient documentation

## 2019-02-28 DIAGNOSIS — F17210 Nicotine dependence, cigarettes, uncomplicated: Secondary | ICD-10-CM | POA: Diagnosis not present

## 2019-02-28 DIAGNOSIS — J020 Streptococcal pharyngitis: Secondary | ICD-10-CM

## 2019-02-28 DIAGNOSIS — J029 Acute pharyngitis, unspecified: Secondary | ICD-10-CM | POA: Diagnosis present

## 2019-02-28 LAB — GROUP A STREP BY PCR: Group A Strep by PCR: DETECTED — AB

## 2019-02-28 MED ORDER — LIDOCAINE VISCOUS HCL 2 % MT SOLN
15.0000 mL | Freq: Once | OROMUCOSAL | Status: AC
  Administered 2019-02-28: 13:00:00 15 mL via OROMUCOSAL
  Filled 2019-02-28: qty 15

## 2019-02-28 MED ORDER — PENICILLIN G BENZATHINE 1200000 UNIT/2ML IM SUSP
1.2000 10*6.[IU] | Freq: Once | INTRAMUSCULAR | Status: AC
  Administered 2019-02-28: 1.2 10*6.[IU] via INTRAMUSCULAR
  Filled 2019-02-28: qty 2

## 2019-02-28 NOTE — ED Provider Notes (Signed)
[facility] EMERGENCY DEPARTMENT Provider Note   CSN: [id number] Arrival date & time: 02/28/19  1131     History Chief Complaint  Patient presents with  . Sore Throat    Allison Prince is a 29 y.o. female presenting for evaluation of sore throat.  Patient states of the past 2 days, she is not been feeling well.  Initially she developed a headache and a mild sore throat.  Her headache has gradually resolved, but her sore throat is worsening.  It now hurts anytime she swallows.  She reports nausea 2 days ago, but no further nausea or vomiting.  She states her ears feel full, but not painful.  No nasal congestion or rhinorrhea.  She has fevers, chills, cough, abdominal pain.  She took 1 dose of Tylenol yesterday without significant change in her symptoms.  She denies sick contacts.  She has no other medical problems, takes no medications daily.  HPI     Past Medical History:  Diagnosis Date  . Allergy   . Fibroid   . Medical history non-contributory     Patient Active Problem List   Diagnosis Date Noted  . Encounter for IUD insertion 09/29/2018  . Ectopic pregnancy 09/23/2018  . Abdominal pain 09/23/2018  . Abnormal Pap smear of cervix 03/23/2018  . Postpartum hypertension 02/27/2017  . Smoker 06/25/2016  . Uterine fibroids 06/25/2016  . Tobacco use disorder 02/08/2014    Past Surgical History:  Procedure Laterality Date  . NO PAST SURGERIES    . tooth pulled       OB History    Gravida  2   Para  1   Term  1   Preterm      AB  1   Living  1     SAB      TAB      Ectopic  1   Multiple  0   Live Births  1           Family History  Problem Relation Age of Onset  . COPD Mother   . Depression Mother   . Diabetes Mother   . Hyperlipidemia Mother   . Hypertension Mother   . Asthma Brother   . Arthritis Paternal Grandfather     Social History   Tobacco Use  . Smoking status: Current Every Day Smoker    Packs/day: 1.00    Years: 6.00     Pack years: 6.00    Types: Cigarettes  . Smokeless tobacco: Never Used  . Tobacco comment: 1-2 a day  Substance Use Topics  . Alcohol use: Yes    Alcohol/week: 2.0 standard drinks    Types: 2 Shots of liquor per week    Comment: occ  . Drug use: No    Home Medications Prior to Admission medications   Medication Sig Start Date End Date Taking? Authorizing Provider  methocarbamol (ROBAXIN) 500 MG tablet Take 1 tablet (500 mg total) by mouth 2 (two) times daily as needed for muscle spasms. Patient not taking: Reported on 09/22/2018 09/11/18   Ferdie Bakken, PA-C  naproxen (NAPROSYN) 500 MG tablet Take 1 tablet (500 mg total) by mouth 2 (two) times daily with a meal. Patient not taking: Reported on 09/29/2018 09/11/18   Ritvik Mczeal, PA-C    Allergies    Latex and Asa [aspirin]  Review of Systems   Review of Systems  HENT: Positive for sore throat.   Neurological: Positive for headaches.    Physical  Exam Updated Vital Signs BP (!) 150/106   Pulse 83   Temp 98.4 F (36.9 C) (Oral)   Resp 20   Ht 5\' 2"  (1.575 m)   Wt 73.9 kg   SpO2 98%   BMI 29.81 kg/m   Physical Exam Vitals and nursing note reviewed.  Constitutional:      General: She is not in acute distress.    Appearance: She is well-developed.     Comments: Resting comfortably in the bed in no acute distress  HENT:     Head: Normocephalic and atraumatic.     Right Ear: Tympanic membrane, ear canal and external ear normal.     Left Ear: Tympanic membrane, ear canal and external ear normal.     Nose: Mucosal edema present.     Right Sinus: No maxillary sinus tenderness or frontal sinus tenderness.     Left Sinus: No maxillary sinus tenderness or frontal sinus tenderness.     Mouth/Throat:     Pharynx: Uvula midline. Posterior oropharyngeal erythema present.     Tonsils: Tonsillar exudate present. 1+ on the right. 1+ on the left.     Comments: OP erythematous with mild bilateral tonsillar swelling with  exudate.  Uvula midline.  No trismus.  Handling secretions easily.  No muffled voice. Eyes:     Extraocular Movements: Extraocular movements intact.     Conjunctiva/sclera: Conjunctivae normal.     Pupils: Pupils are equal, round, and reactive to light.  Cardiovascular:     Rate and Rhythm: Normal rate and regular rhythm.     Pulses: Normal pulses.  Pulmonary:     Effort: Pulmonary effort is normal.     Breath sounds: Normal breath sounds. No decreased breath sounds, wheezing, rhonchi or rales.     Comments: Clear lung sounds in all fields Abdominal:     General: There is no distension.     Palpations: Abdomen is soft.     Tenderness: There is no abdominal tenderness.  Musculoskeletal:        General: Normal range of motion.     Cervical back: Normal range of motion.  Skin:    General: Skin is warm.     Capillary Refill: Capillary refill takes less than 2 seconds.  Neurological:     Mental Status: She is alert and oriented to person, place, and time.     ED Results / Procedures / Treatments   Labs (all labs ordered are listed, but only abnormal results are displayed) Labs Reviewed  GROUP A STREP BY PCR - Abnormal; Notable for the following components:      Result Value   Group A Strep by PCR DETECTED (*)    All other components within normal limits    EKG None  Radiology No results found.  Procedures Procedures (including critical care time)  Medications Ordered in ED Medications  lidocaine (XYLOCAINE) 2 % viscous mouth solution 15 mL (15 mLs Mouth/Throat Given 02/28/19 1304)  penicillin g benzathine (BICILLIN LA) 1200000 UNIT/2ML injection 1.2 Million Units (1.2 Million Units Intramuscular Given 02/28/19 1341)    ED Course  I have reviewed the triage vital signs and the nursing notes.  Pertinent labs & imaging results that were available during my care of the patient were reviewed by me and considered in my medical decision making (see chart for details).    MDM  Rules/Calculators/A&P  Patient presenting for evaluation of 3-day history of sore throat.  Physical exam consistent with strep.  Also consider viral illness.  Will order strep test and reassess.  Strep positive.  Patient without signs of peritonsillar abscess.  Discussed option of Bicillin versus amoxicillin pills, patient elects for IM injection today.  Discussed continued symptomatic treatment at home with Tylenol, ibuprofen, and hydration.  At this time, patient appears safe for discharge.  Return precautions given.  Patient states she understands and agrees to plan.  Final Clinical Impression(s) / ED Diagnoses Final diagnoses:  Strep pharyngitis    Rx / DC Orders ED Discharge Orders    None       Franchot Heidelberg, PA-C 02/28/19 1433    Daleen Bo, MD 03/01/19 [phone]

## 2019-02-28 NOTE — Discharge Instructions (Addendum)
You were treated for strep throat today. You do not need further antibiotics.  Use Tylenol or ibuprofen as needed for fever or sore throat. Make sure you stay well-hydrated water. Return to the emergency room if you develop worsening symptoms including worsening fever, inability to open your mouth, severe worsening pain, voice change, or any new, worsening, or concerning symptoms.

## 2019-02-28 NOTE — ED Triage Notes (Signed)
Pt reports sore throat, headache, dry heaves since Friday. Pt denies any known fever and reports was around someone that recently had covid, but after their quarantine.

## 2020-10-15 ENCOUNTER — Other Ambulatory Visit: Payer: Self-pay

## 2020-10-15 ENCOUNTER — Encounter: Payer: Self-pay | Admitting: Adult Health

## 2020-10-15 ENCOUNTER — Ambulatory Visit: Payer: BC Managed Care – PPO | Admitting: Adult Health

## 2020-10-15 VITALS — BP 154/104 | HR 85 | Ht 62.0 in | Wt 167.5 lb

## 2020-10-15 DIAGNOSIS — N920 Excessive and frequent menstruation with regular cycle: Secondary | ICD-10-CM | POA: Diagnosis not present

## 2020-10-15 DIAGNOSIS — Z30011 Encounter for initial prescription of contraceptive pills: Secondary | ICD-10-CM

## 2020-10-15 DIAGNOSIS — Z30432 Encounter for removal of intrauterine contraceptive device: Secondary | ICD-10-CM

## 2020-10-15 DIAGNOSIS — T8332XA Displacement of intrauterine contraceptive device, initial encounter: Secondary | ICD-10-CM | POA: Diagnosis not present

## 2020-10-15 DIAGNOSIS — I10 Essential (primary) hypertension: Secondary | ICD-10-CM

## 2020-10-15 MED ORDER — AMLODIPINE BESYLATE 5 MG PO TABS: 5.0000 mg | ORAL_TABLET | Freq: Every day | ORAL | 3 refills | Status: DC

## 2020-10-15 MED ORDER — NORETHINDRONE 0.35 MG PO TABS: 1.0000 | ORAL_TABLET | Freq: Every day | ORAL | 11 refills | Status: DC

## 2020-10-15 NOTE — Progress Notes (Signed)
  Subjective:     Patient ID: [name], female   DOB: 1990/09/08, 30 y.o.   MRN: [id number]  HPI Preslea is a 30 year old black female,G2P1011 in complaining of periods heavier and cramps worse, wants IUD checked.   Review of Systems +periods heavier +cramps worse Reviewed past medical,surgical, social and family history. Reviewed medications and allergies.     Objective:   Physical Exam BP (!) 154/104 (BP Location: Left Arm, Patient Position: Sitting, Cuff Size: Normal)   Pulse 85   Ht 5\' 2"  (1.575 m)   Wt 167 lb 8 oz (76 kg)   LMP 09/16/2020 (Approximate)   BMI 30.64 kg/m  Skin warm and dry.Pelvic: external genitalia is normal in appearance no lesions, vagina: pink,urethra has no lesions or masses noted, cervix: bulbous,+IUD string seen and IUD is at os, grasped with forceps and asked to cough and IUD removed,due to malposition, uterus: normal size, shape and contour, non tender, no masses felt, adnexa: no masses or tenderness noted. Bladder is non tender and no masses felt.    Fall risk is low  Upstream - 10/15/20 1533       Pregnancy Intention Screening   Does the patient want to become pregnant in the next year? No    Does the patient's partner want to become pregnant in the next year? No    Would the patient like to discuss contraceptive options today? Yes      Contraception Wrap Up   Current Method IUD or IUS    End Method Oral Contraceptive    Contraception Counseling Provided Yes            Examination chaperoned by [employer]. Assessment:     1. Malpositioned intrauterine device (IUD), initial encounter   2. Encounter for IUD removal   3. Menorrhagia with regular cycle   4. Encounter for initial prescription of contraceptive pills Will rx Micronor, start today and use condoms  5. Hypertension, unspecified type Will rx norvasc  Meds ordered this encounter  Medications   norethindrone (MICRONOR) 0.35 MG tablet    Sig: Take 1 tablet (0.35 mg  total) by mouth daily.    Dispense:  28 tablet    Refill:  11    Order Specific Question:   Supervising Provider    Answer:   Tania Ade H [[id number]]   amLODipine (NORVASC) 5 MG tablet    Sig: Take 1 tablet (5 mg total) by mouth daily.    Dispense:  30 tablet    Refill:  3    Order Specific Question:   Supervising Provider    Answer:   Florian Buff [[id number]]       Plan:     Follow up in 4 weeks for pap and physical with me

## 2020-11-19 IMAGING — US OBSTETRIC <14 WK US AND TRANSVAGINAL OB US
1 series · 13 of 28 positions shown · non-contrast
Comparison: None.

CLINICAL DATA: Vaginal bleeding and pain.

EXAM:
OBSTETRIC <14 WK US AND TRANSVAGINAL OB US
TECHNIQUE: Both transabdominal and transvaginal ultrasound examinations were
performed for complete evaluation of the gestation as well as the
maternal uterus, adnexal regions, and pelvic cul-de-sac.
Transvaginal technique was performed to assess early pregnancy.

[Series 1: obstetric <14 wk us and transvaginal ob us · 0.19mm/px · 13 of 186 slices shown]
[im 7/186]
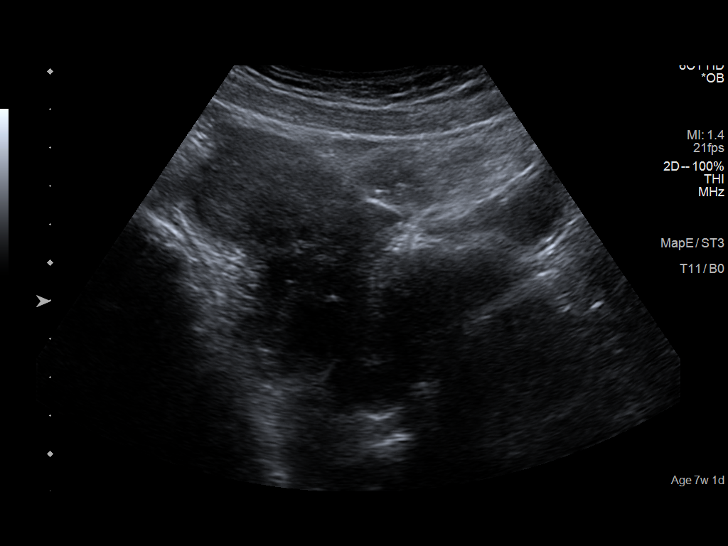
[im 21/186]
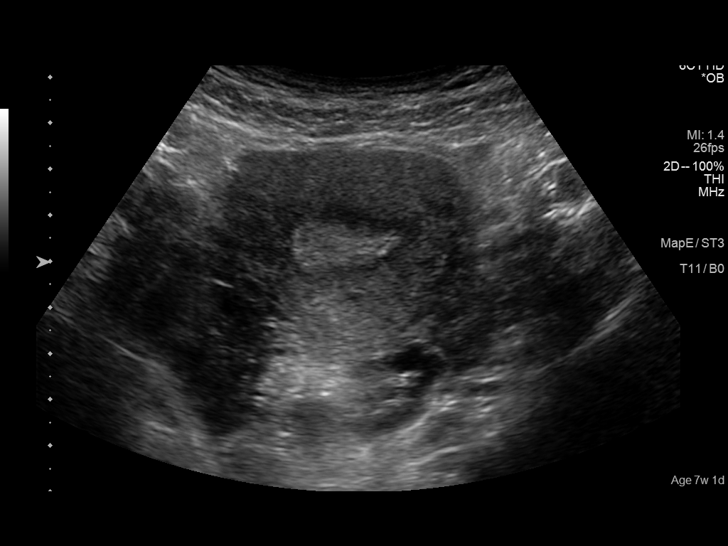
[im 35/186]
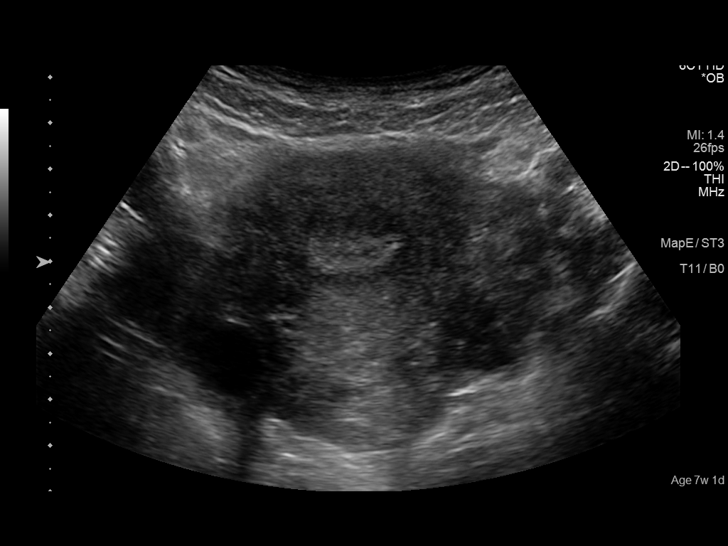
[im 48/186]
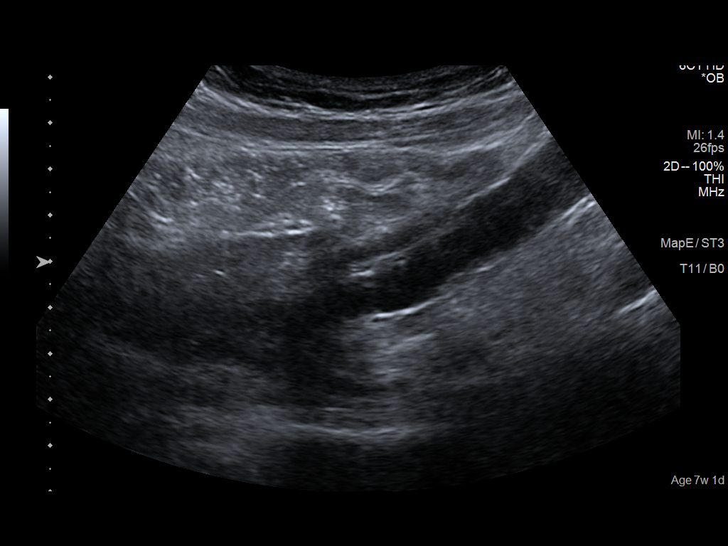
[im 62/186]
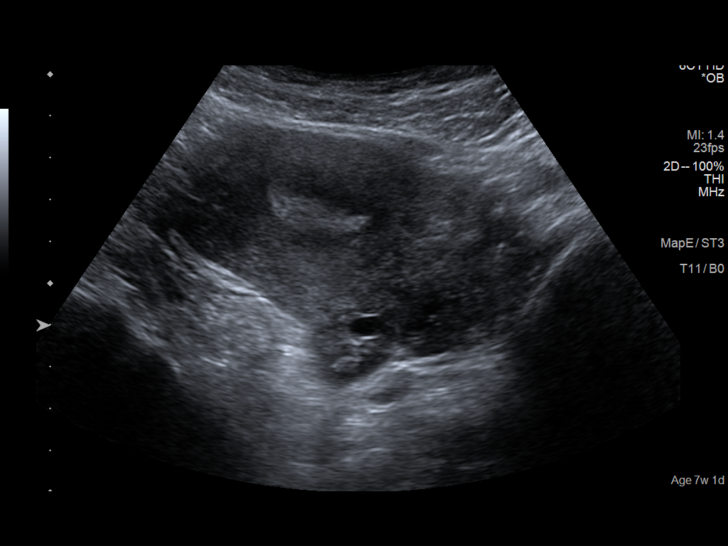
[im 76/186]
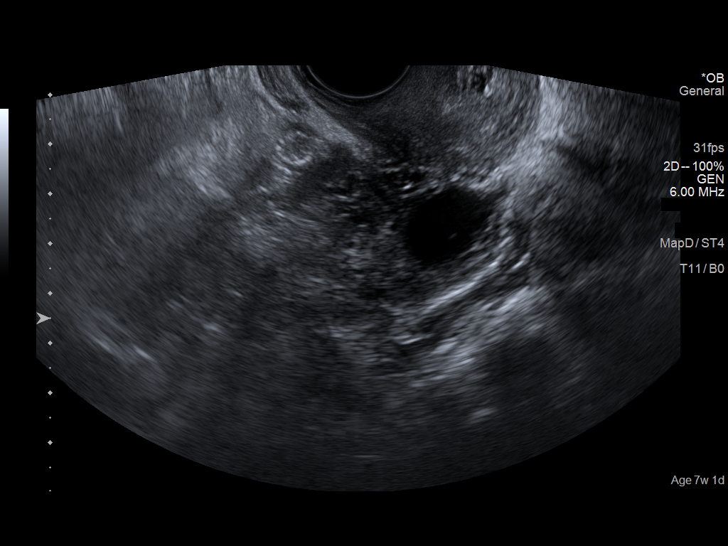
[im 96/186]
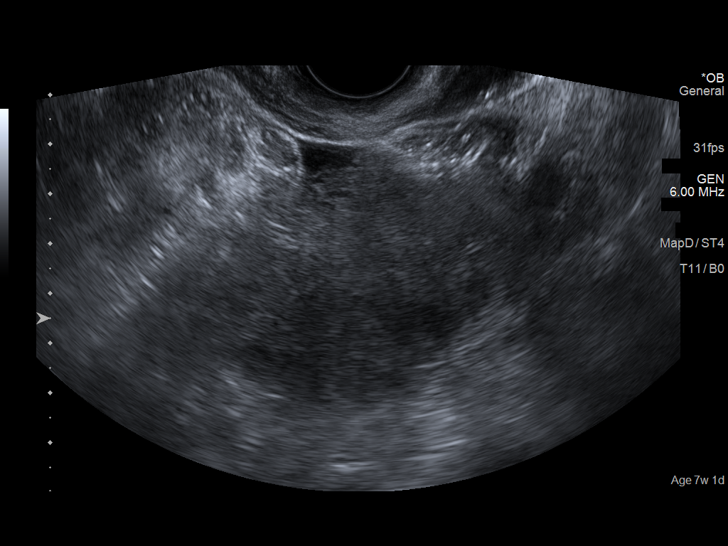
[im 110/186]
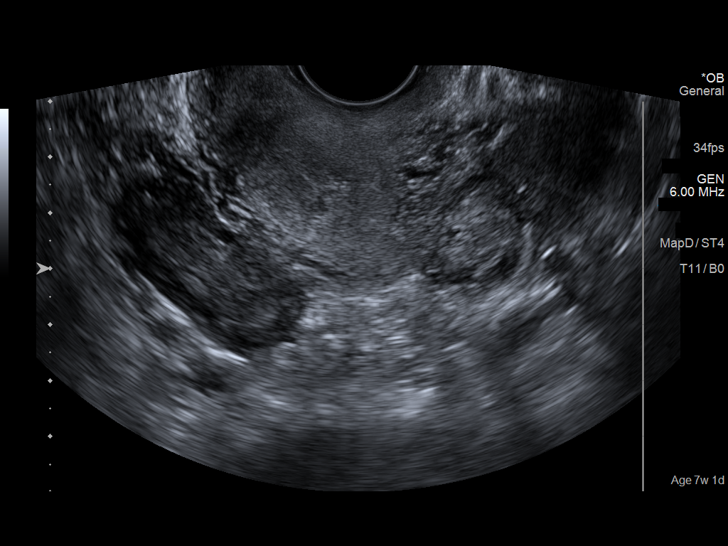
[im 124/186]
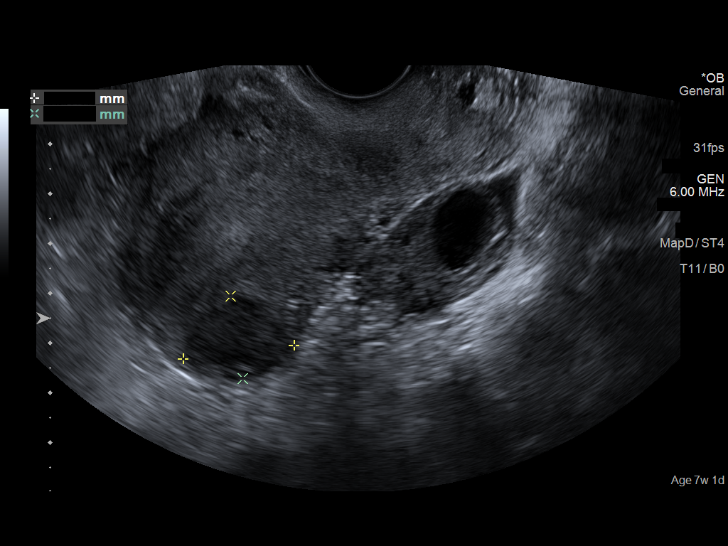
[im 138/186]
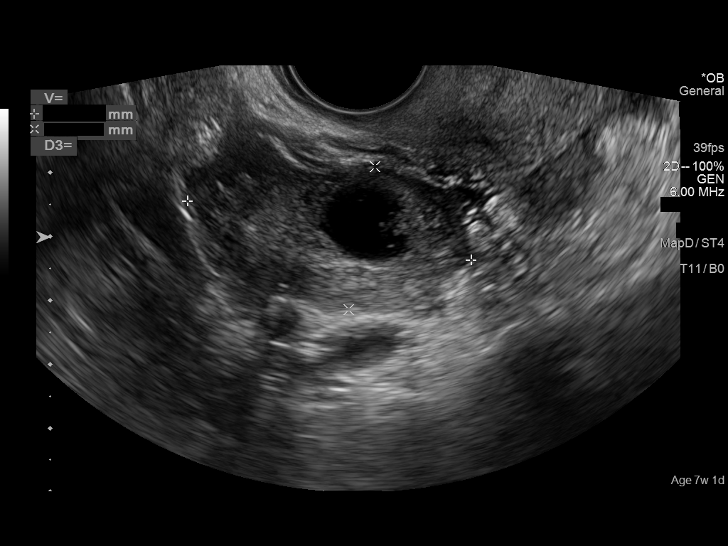
[im 151/186]
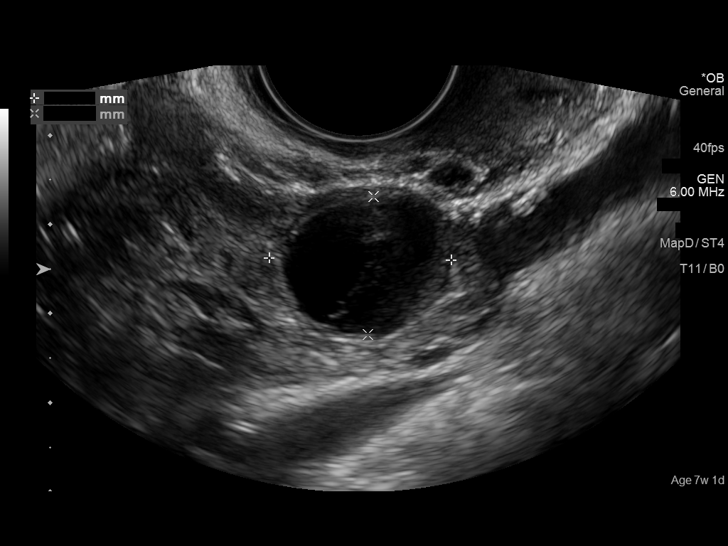
[im 165/186]
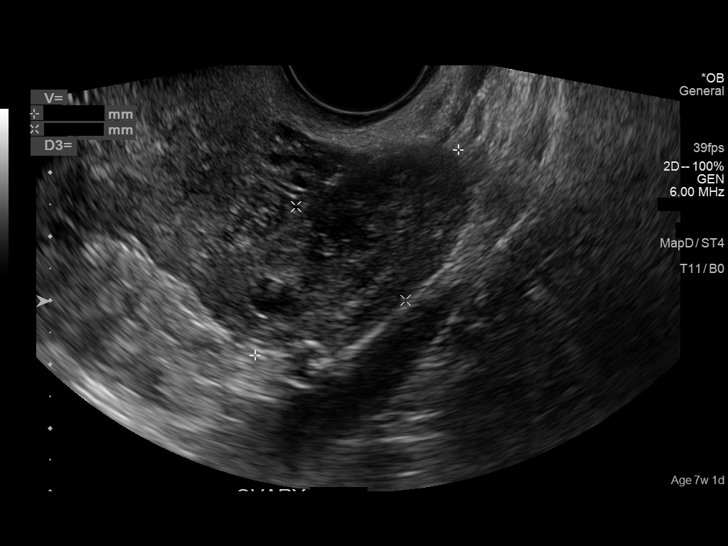
[im 179/186]
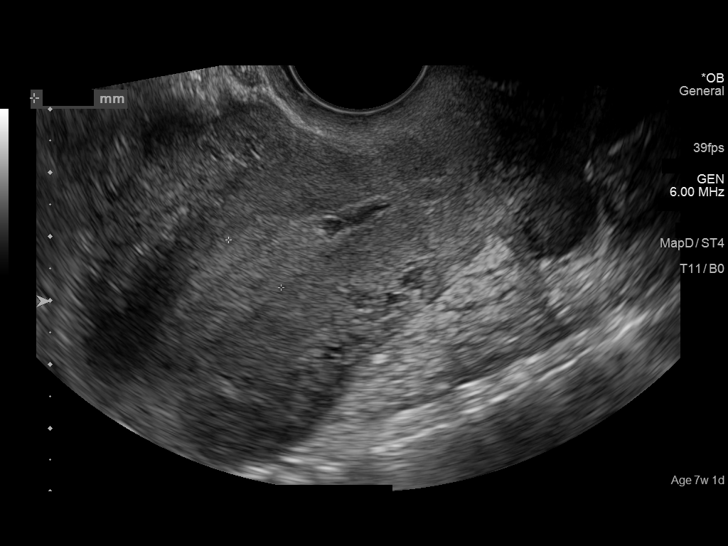

[13 of 28 positions shown; findings below may reference images not displayed]

FINDINGS: Intrauterine gestational sac: None

Yolk sac:  Not Visualized.

Embryo:  Not Visualized.

Cardiac Activity: Not Visualized.

Maternal uterus/adnexae: There is a 2.3 x 1.7 cm hypoechoic area in
the posterior uterine fundus favored to represent a fibroid. There
is a complex appearing cystic structure in the right ovary measuring
2 x 1.6 x 1.8 cm. This cystic structure demonstrates low level
internal echoes. There appears to be surrounding color Doppler flow.
There is some fluid in the lower uterine segment. The endometrium
measures approximately 1.1 cm. There is a small amount of free fluid
in the cul-de-sac.
IMPRESSION: 1.  No IUP is visualized.

By definition, in the setting of a positive pregnancy test, this
reflects a pregnancy of unknown location. Differential
considerations include early normal IUP, abnormal IUP/missed
abortion, or nonvisualized ectopic pregnancy.

Serial beta HCG is suggested. Consider repeat pelvic ultrasound in
14 days.
2. Probable 2 cm fibroid posterior uterine fundus.
3. Complex cystic mass in the right ovary is favored to represent a
corpus luteal cyst.
4. Small amount of fluid in the lower uterine segment.
5. Small amount of fluid in the cul-de-sac.

## 2020-11-22 ENCOUNTER — Ambulatory Visit (INDEPENDENT_AMBULATORY_CARE_PROVIDER_SITE_OTHER): Payer: BC Managed Care – PPO | Admitting: Adult Health

## 2020-11-22 ENCOUNTER — Other Ambulatory Visit (HOSPITAL_COMMUNITY)
Admission: RE | Admit: 2020-11-22 | Discharge: 2020-11-22 | Disposition: A | Discharge status: Home / Self Care | Payer: BC Managed Care – PPO | Source: Ambulatory Visit | Attending: Adult Health | Admitting: Adult Health

## 2020-11-22 ENCOUNTER — Encounter: Payer: Self-pay | Admitting: Adult Health

## 2020-11-22 ENCOUNTER — Other Ambulatory Visit: Payer: Self-pay

## 2020-11-22 VITALS — BP 155/101 | HR 70 | Ht 62.0 in | Wt 170.0 lb

## 2020-11-22 DIAGNOSIS — Z113 Encounter for screening for infections with a predominantly sexual mode of transmission: Secondary | ICD-10-CM | POA: Insufficient documentation

## 2020-11-22 DIAGNOSIS — Z01419 Encounter for gynecological examination (general) (routine) without abnormal findings: Secondary | ICD-10-CM | POA: Insufficient documentation

## 2020-11-22 DIAGNOSIS — I10 Essential (primary) hypertension: Secondary | ICD-10-CM

## 2020-11-22 DIAGNOSIS — Z3041 Encounter for surveillance of contraceptive pills: Secondary | ICD-10-CM

## 2020-11-22 MED ORDER — HYDROCHLOROTHIAZIDE 12.5 MG PO CAPS: 12.5000 mg | ORAL_CAPSULE | Freq: Every day | ORAL | 6 refills | Status: DC

## 2020-11-22 NOTE — Progress Notes (Signed)
Patient ID: Allison Prince, female   DOB: 01-01-1991, 30 y.o.   MRN: [id number] History of Present Illness: Allison Prince is a 30 year old black female,G2P1011 in for well woman gyn exam and pap. She had IUD removed 10/15/20 and started on Micronor and BP meds.   Current Medications, Allergies, Past Medical History, Past Surgical History, Family History and Social History were reviewed in [employer] record.     Review of Systems: Patient denies any headaches, hearing loss, fatigue, blurred vision, shortness of breath, chest pain, abdominal pain, problems with bowel movements, urination, or intercourse. No joint pain or mood swings.  +irregular bleeding    Physical Exam:BP (!) 155/101 (BP Location: Right Arm, Patient Position: Sitting, Cuff Size: Normal)   Pulse 70   Ht 5\' 2"  (1.575 m)   Wt 170 lb (77.1 kg)   LMP 10/20/2020 Comment: having irregular bleeding  BMI 31.09 kg/m   General:  Well developed, well nourished, no acute distress Skin:  Warm and dry Neck:  Midline trachea, normal thyroid, good ROM, no lymphadenopathy Lungs; Clear to auscultation bilaterally Breast:  No dominant palpable mass, retraction, or nipple discharge Cardiovascular: Regular rate and rhythm Abdomen:  Soft, non tender, no hepatosplenomegaly Pelvic:  External genitalia is normal in appearance, no lesions.  The vagina is normal in appearance.+period blood. Urethra has no lesions or masses. The cervix is bulbous.  Pap with GC/CHL and HR HPV genotyping performed. Uterus is felt to be normal size, shape, and contour.  No adnexal masses or tenderness noted.Bladder is non tender, no masses felt. Rectal: Deferred Extremities/musculoskeletal:  No swelling or varicosities noted, no clubbing or cyanosis Psych:  No mood changes, alert and cooperative,seems happy AA is 6 Fall risk is low Depression screen [facility] 2/9 11/22/2020 03/15/2018 11/10/2017  Decreased Interest 0 0 0  Down, Depressed, Hopeless 0 0  0  PHQ - 2 Score 0 0 0  Altered sleeping - - -  Tired, decreased energy - - -  Change in appetite - - -  Feeling bad or failure about yourself  - - -  Trouble concentrating - - -  Moving slowly or fidgety/restless - - -  Suicidal thoughts - - -  PHQ-9 Score - - -  Difficult doing work/chores - - -     Upstream - 11/22/20 1436       Pregnancy Intention Screening   Does the patient want to become pregnant in the next year? No    Does the patient's partner want to become pregnant in the next year? No    Would the patient like to discuss contraceptive options today? No      Contraception Wrap Up   Current Method Oral Contraceptive    End Method Oral Contraceptive    Contraception Counseling Provided No             Examination chaperoned by Celene Squibb LPN  Impression and Plan: 1. Encounter for gynecological examination with Papanicolaou smear of cervix Pap sent Physical in 1 year Pap in 3 if normal  2. Screen for STD (sexually transmitted disease) GC/CHL on pap  3. Hypertension, unspecified type Continue Norvasc 5 mg has refills Will add Microzide Stop energy drinks Review DASH diet, to decrease salt and sugar Follow up with me in about 4 weeks for BP check and ROS   4. Encounter for surveillance of contraceptive pills Continue Micronor, has refills, can take 3 months to regulate cycle

## 2020-11-29 LAB — CYTOLOGY - PAP
Chlamydia: NEGATIVE
Comment: NEGATIVE
Comment: NEGATIVE
Comment: NORMAL
Diagnosis: NEGATIVE
Diagnosis: REACTIVE
High risk HPV: NEGATIVE
Neisseria Gonorrhea: NEGATIVE

## 2020-12-17 ENCOUNTER — Encounter: Payer: Self-pay | Admitting: Adult Health

## 2020-12-17 ENCOUNTER — Ambulatory Visit: Payer: BC Managed Care – PPO | Admitting: Adult Health

## 2020-12-17 ENCOUNTER — Other Ambulatory Visit: Payer: Self-pay

## 2020-12-17 VITALS — BP 140/87 | HR 92 | Ht 62.0 in | Wt 168.0 lb

## 2020-12-17 DIAGNOSIS — I10 Essential (primary) hypertension: Secondary | ICD-10-CM

## 2020-12-17 MED ORDER — AMLODIPINE BESYLATE 5 MG PO TABS: 5.0000 mg | ORAL_TABLET | Freq: Every day | ORAL | 6 refills | Status: DC

## 2020-12-17 NOTE — Progress Notes (Signed)
  Subjective:     Patient ID: Allison Prince, female   DOB: 10/24/1990, 30 y.o.   MRN: [id number]  HPI Allison Prince is a 30 year old black female,single, G2P1011 in for BP check, added Microzide to Norvasc 11/22/20.  She has stopped energy drinks.  Lab Results  Component Value Date   DIAGPAP  11/22/2020    - Negative for Intraepithelial Lesions or Malignancy (NILM)   DIAGPAP - Benign reactive/reparative changes 11/22/2020   HPV NOT DETECTED 03/15/2018   [place] Negative 11/22/2020    Review of Systems Patient denies any headaches, hearing loss, fatigue, blurred vision, shortness of breath, chest pain, abdominal pain, problems with bowel movements, urination, or intercourse. No joint pain or mood swings.    Reviewed past medical,surgical, social and family history. Reviewed medications and allergies.  Objective:   Physical Exam BP 140/87 (BP Location: Right Arm, Patient Position: Sitting, Cuff Size: Large)   Pulse 92   Ht 5\' 2"  (1.575 m)   Wt 168 lb (76.2 kg)   LMP 11/10/2020 (Approximate)   BMI 30.73 kg/m  Skin warm and dry. Lungs: clear to ausculation bilaterally. Cardiovascular: regular rate and rhythm.  She has lost 2 lbs and BP is lower that last visit.      Upstream - 12/17/20 1553       Pregnancy Intention Screening   Does the patient want to become pregnant in the next year? No    Does the patient's partner want to become pregnant in the next year? No    Would the patient like to discuss contraceptive options today? No      Contraception Wrap Up   Current Method Oral Contraceptive    End Method Oral Contraceptive    Contraception Counseling Provided No             Assessment:     1. Hypertension, unspecified type Continue Norvasc 5 mg daily will refill today Continue Microzide 12.5 mg has refills  Continue weight loss efforts and decreasing salt Now starting decreasing smoking Meds ordered this encounter  Medications   amLODipine (NORVASC) 5 MG tablet     Sig: Take 1 tablet (5 mg total) by mouth daily.    Dispense:  30 tablet    Refill:  6    Order Specific Question:   Supervising Provider    Answer:   Florian Buff [[id number]]       Plan:      Follow up in 4 months or sooner if needed

## 2021-01-08 IMAGING — CT CT ABDOMEN AND PELVIS WITH CONTRAST
2 of 4 series · 16 of 46 positions shown, 18 images · IV contrast (omnipaque)
Comparison: Obstetrical ultrasound dated 08/04/2018

CLINICAL DATA: 28-year-old female with recent methotrexate trauma
for ectopic pregnancy complaining for intermittent bilateral
abdominal pain and nausea.

EXAM:
CT ABDOMEN AND PELVIS WITH CONTRAST
TECHNIQUE: Multidetector CT imaging of the abdomen and pelvis was performed
using the standard protocol following bolus administration of
intravenous contrast.
CONTRAST:  100mL OMNIPAQUE IOHEXOL 300 MG/ML  SOLN

[Series 2: axial st · axial · 0.62mm/px · z∈[-606,-266]mm · 13 of 78 slices shown, 15 images]
[im 5/78  soft-tissue]
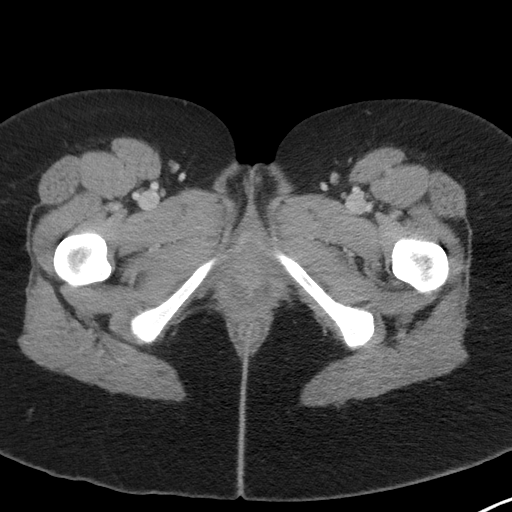
[im 5/78  bone]
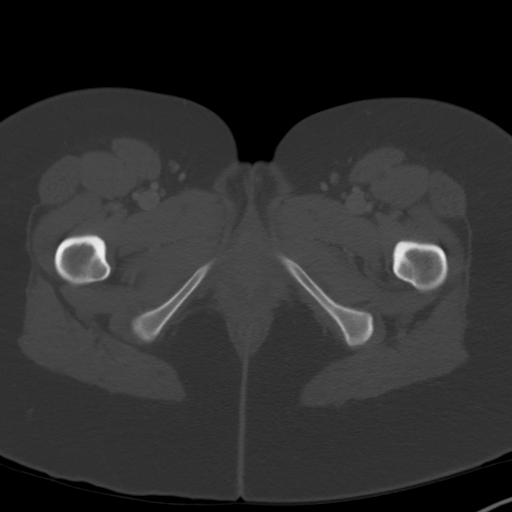
[im 9/78  soft-tissue]
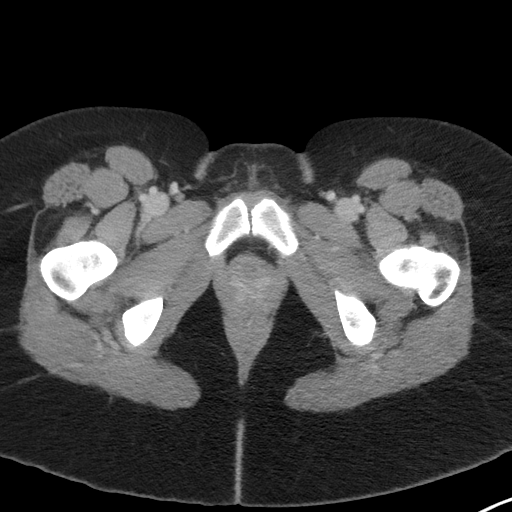
[im 18/78  soft-tissue]
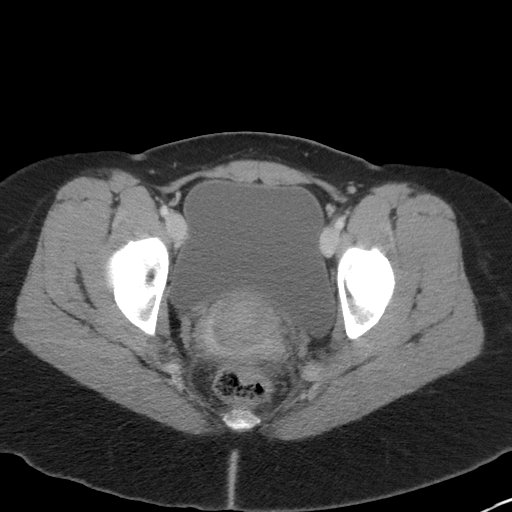
[im 22/78  soft-tissue]
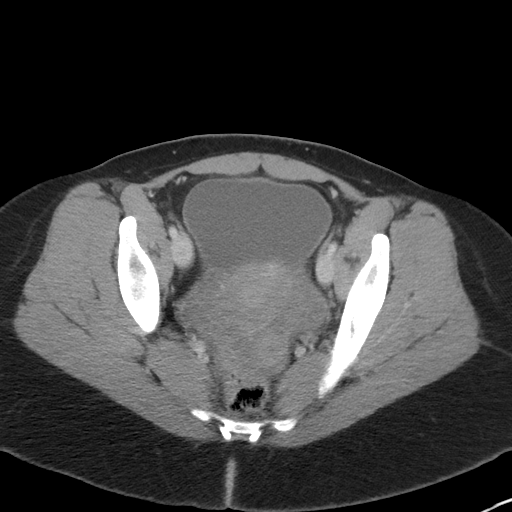
[im 26/78  soft-tissue]
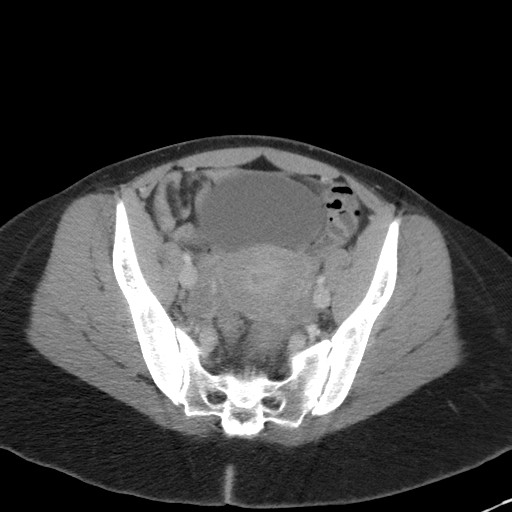
[im 35/78  soft-tissue]
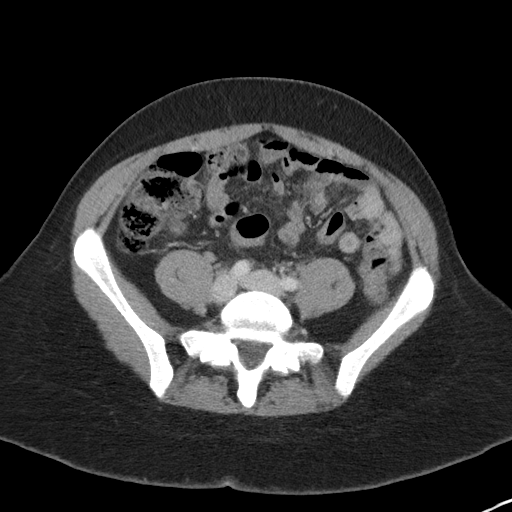
[im 39/78  soft-tissue]
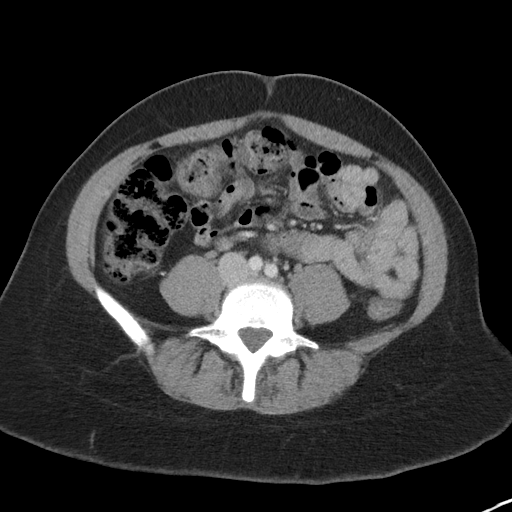
[im 43/78  soft-tissue]
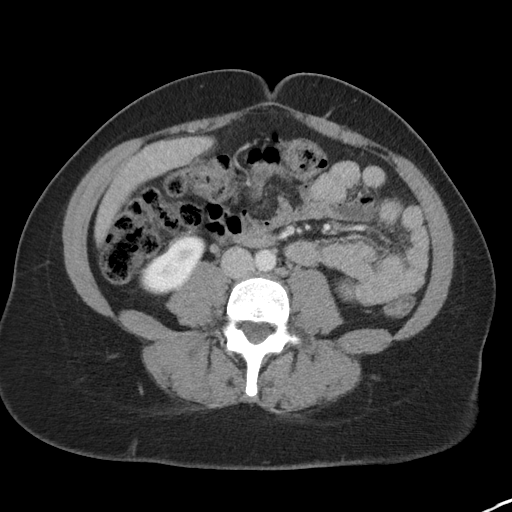
[im 52/78  soft-tissue]
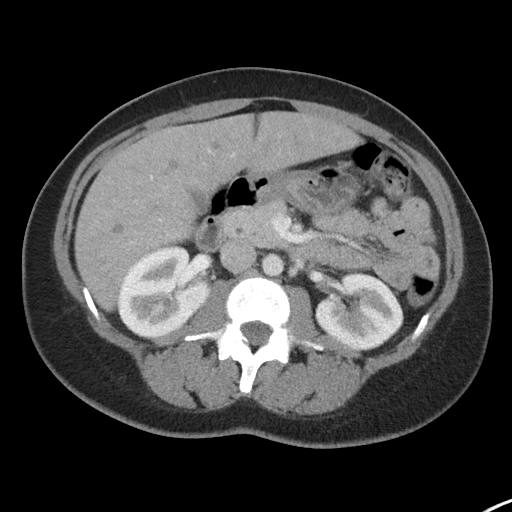
[im 52/78  bone]
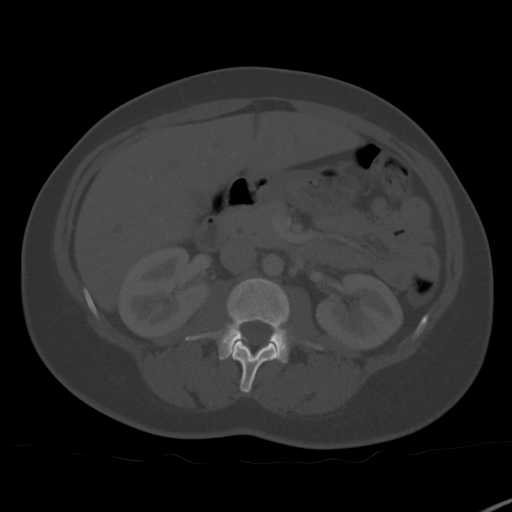
[im 56/78  soft-tissue]
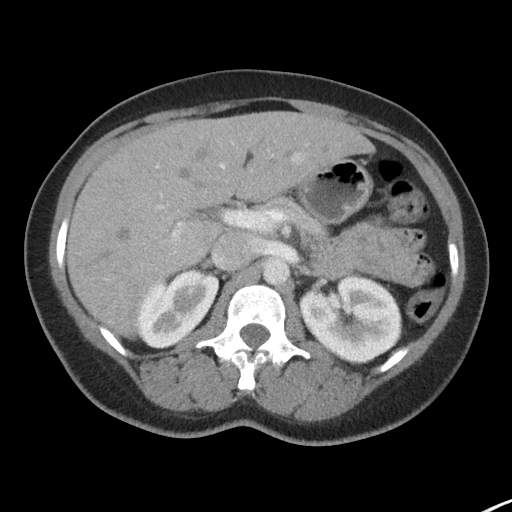
[im 60/78  soft-tissue]
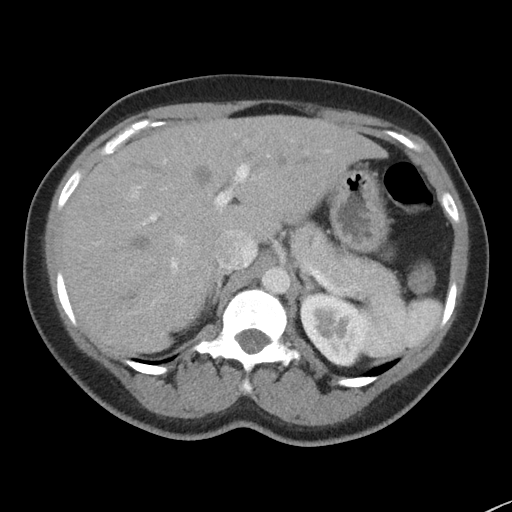
[im 69/78  soft-tissue]
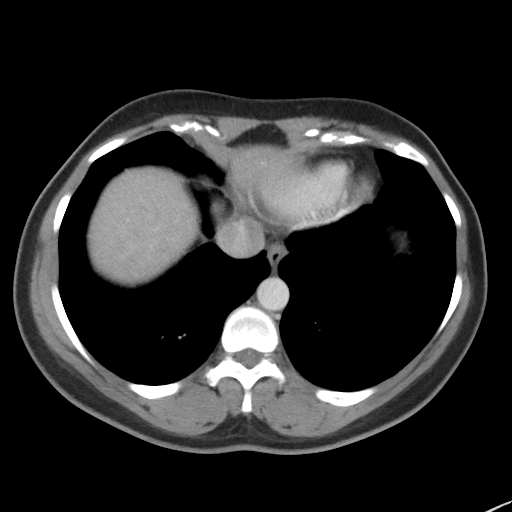
[im 73/78  soft-tissue]
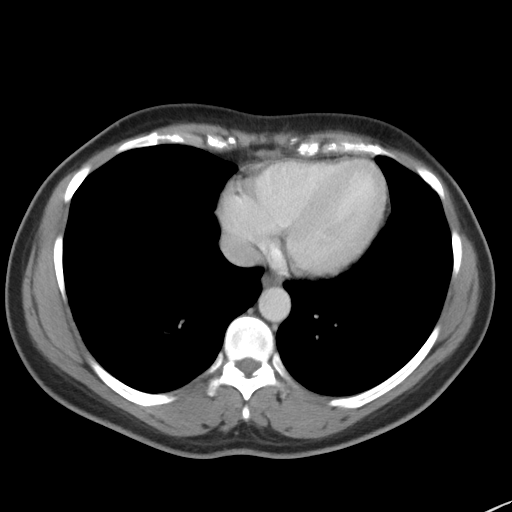

[Series 5: coronal st · coronal · 0.71mm/px · 3 of 96 slices shown]
[im 32/96  soft-tissue]
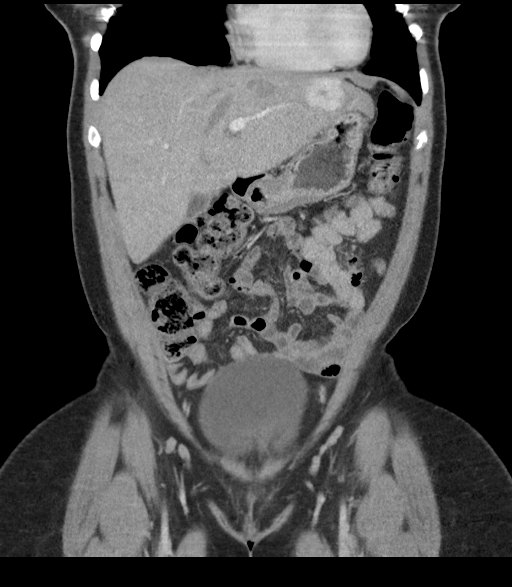
[im 43/96  soft-tissue]
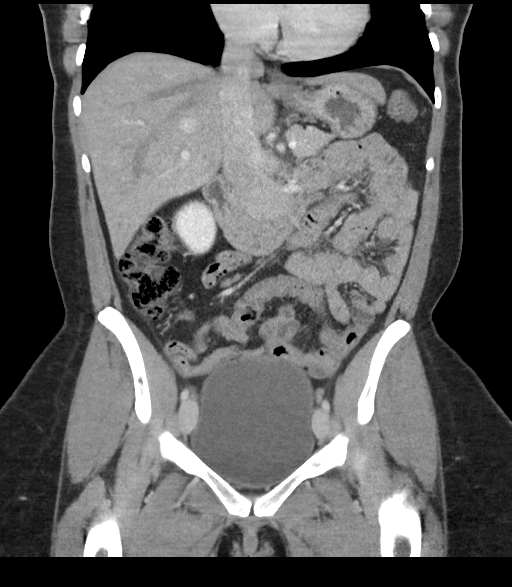
[im 53/96  soft-tissue]
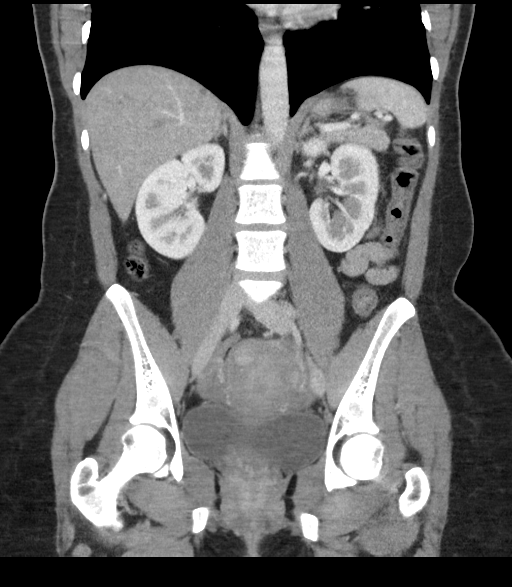

[16 of 46 positions shown; findings below may reference images not displayed]

FINDINGS: Lower chest: The visualized lung bases are clear.

Intra-abdominal free air. Trace free fluid may be present within the
pelvis.

Hepatobiliary: Multiple enhancing liver lesions measuring up to
x 2.7 cm in the left lobe of the liver are not characterized but may
represent small hemangiomata or adenoma given the patient's age.
Further characterization with MRI without and with contrast on a
nonemergent basis recommended. Ill-defined low attenuation in the
inferior right lobe of the liver (series 2, image 29) is also not
characterized. There is no intrahepatic biliary ductal dilatation.
The gallbladder is unremarkable.

Pancreas: Unremarkable. No pancreatic ductal dilatation or
surrounding inflammatory changes.

Spleen: Normal in size without focal abnormality.

Adrenals/Urinary Tract: The adrenal glands are unremarkable. The
kidneys, visualized ureters, and urinary bladder appear unremarkable
as well.

Stomach/Bowel: There is no bowel obstruction or active inflammation.
The appendix is normal.

Vascular/Lymphatic: The abdominal aorta and IVC are unremarkable. No
portal venous gas. There is no adenopathy.

Reproductive: The uterus is anteverted. Several small fundal
fibroids noted. The ovaries are grossly unremarkable. There is a
somewhat ill-defined 3.3 x 2.7 x 3.8 cm ovoid structure in the left
posterior pelvis posteromedial to the left ovary corresponding to
the known ectopic pregnancy.

Other: Small fat containing umbilical hernia

Musculoskeletal: No acute or significant osseous findings.
IMPRESSION: 1. Ill-defined ovoid mass in the left posterior hemipelvis in
keeping with known ectopic pregnancy. No large pelvic hematoma.
2. Multiple enhancing liver lesions are not characterized but may
represent small hemangiomata or adenoma given the patient's age.
Further characterization with MRI without and with contrast on a
nonemergent basis recommended.

## 2021-04-16 ENCOUNTER — Encounter: Payer: Self-pay | Admitting: Adult Health

## 2021-04-16 ENCOUNTER — Other Ambulatory Visit: Payer: Self-pay

## 2021-04-16 ENCOUNTER — Ambulatory Visit: Payer: BC Managed Care – PPO | Admitting: Adult Health

## 2021-04-16 VITALS — BP 135/94 | HR 89 | Ht 62.0 in | Wt 174.0 lb

## 2021-04-16 DIAGNOSIS — I10 Essential (primary) hypertension: Secondary | ICD-10-CM

## 2021-04-16 MED ORDER — AMLODIPINE BESYLATE 5 MG PO TABS: 5.0000 mg | ORAL_TABLET | Freq: Every day | ORAL | 6 refills | Status: DC

## 2021-04-16 MED ORDER — HYDROCHLOROTHIAZIDE 12.5 MG PO CAPS: 12.5000 mg | ORAL_CAPSULE | Freq: Every day | ORAL | 6 refills | Status: DC

## 2021-04-16 NOTE — Progress Notes (Signed)
?  Subjective:  ?  ? Patient ID: OLEDA BORSKI, female   DOB: [birth year], 31 y.o.   MRN: [id number] ? ?HPI ?Elliett is a 31 year old black female,single, G2P1011, in for BP check is taking Norvasc 5 mg and Microzide 12.5 mg. Has no complaints today. ?Lab Results  ?Component Value Date  ? DIAGPAP  11/22/2020  ?  - Negative for Intraepithelial Lesions or Malignancy (NILM)  ? DIAGPAP - Benign reactive/reparative changes 11/22/2020  ? HPV NOT DETECTED 03/15/2018  ? [place] Negative 11/22/2020  ?  ?Review of Systems ?No complaints, headaches or swelling ?She is working on cutting back on smoking ?Reviewed past medical,surgical, social and family history. Reviewed medications and allergies.  ?   ?Objective:  ? Physical Exam ?BP (!) 135/94 (BP Location: Right Arm, Patient Position: Sitting, Cuff Size: Normal)   Pulse 89   Ht '5\' 2"'$  (1.575 m)   Wt 174 lb (78.9 kg)   LMP 03/21/2021   BMI 31.83 kg/m?   ?  Skin warm and dry. Lungs: clear to ausculation bilaterally. Cardiovascular: regular rate and rhythm.  ? Upstream - 04/16/21 1526   ? ?  ? Pregnancy Intention Screening  ? Does the patient want to become pregnant in the next year? No   ? Does the patient's partner want to become pregnant in the next year? No   ? Would the patient like to discuss contraceptive options today? No   ?  ? Contraception Wrap Up  ? Current Method Oral Contraceptive   ? End Method Oral Contraceptive   ? Contraception Counseling Provided No   ? ?  ?  ? ?  ?  ?Assessment:  ?   ?1. Hypertension, unspecified type ?Will continue Norvasc 5 mg daily and Microzide 12.5 mg daily  ?Meds ordered this encounter  ?Medications  ? amLODipine (NORVASC) 5 MG tablet  ?  Sig: Take 1 tablet (5 mg total) by mouth daily.  ?  Dispense:  30 tablet  ?  Refill:  6  ?  Order Specific Question:   Supervising Provider  ?  Answer:   Tania Ade H [[id number]]  ? hydrochlorothiazide (MICROZIDE) 12.5 MG capsule  ?  Sig: Take 1 capsule (12.5 mg total) by mouth daily.  ?  Dispense:   30 capsule  ?  Refill:  6  ?  Order Specific Question:   Supervising Provider  ?  Answer:   Tania Ade H [[id number]]  ?  ?   ?Plan:  ?   ?Continue to watch salt and sugar ?Try to walk every day ?Follow up in 6 months or sooner of needed  ?  She is trying to get PCP  ?

## 2021-12-10 ENCOUNTER — Encounter: Payer: Self-pay | Admitting: *Deleted

## 2021-12-10 ENCOUNTER — Ambulatory Visit (INDEPENDENT_AMBULATORY_CARE_PROVIDER_SITE_OTHER): Payer: BC Managed Care – PPO | Admitting: *Deleted

## 2021-12-10 ENCOUNTER — Other Ambulatory Visit: Payer: Self-pay | Admitting: Adult Health

## 2021-12-10 VITALS — BP 151/107 | HR 99 | Ht 62.0 in | Wt 174.0 lb

## 2021-12-10 DIAGNOSIS — Z3201 Encounter for pregnancy test, result positive: Secondary | ICD-10-CM

## 2021-12-10 LAB — POCT URINE PREGNANCY: Preg Test, Ur: POSITIVE — AB

## 2021-12-10 MED ORDER — PRENATAL 27-1 MG PO TABS: 1.0000 | ORAL_TABLET | Freq: Every day | ORAL | 12 refills | Status: DC

## 2021-12-10 MED ORDER — PROMETHAZINE HCL 12.5 MG PO TABS: 12.5000 mg | ORAL_TABLET | Freq: Four times a day (QID) | ORAL | 1 refills | Status: DC | PRN

## 2021-12-10 NOTE — Progress Notes (Signed)
Rx pNV and phenergan

## 2021-12-10 NOTE — Progress Notes (Signed)
   NURSE VISIT- PREGNANCY CONFIRMATION   SUBJECTIVE:  Allison Prince is a 31 y.o. G46P1011 female at 48w5dby certain LMP of Patient's last menstrual period was 10/17/2021 (approximate). Here for pregnancy confirmation.  Home pregnancy test: positive x 2.   She reports  nausea, vomiting and cramping .  She is not taking prenatal vitamins.    OBJECTIVE:  BP (!) 151/107 (BP Location: Left Arm, Patient Position: Sitting, Cuff Size: Normal)   Pulse 99   Ht '5\' 2"'$  (1.575 m)   Wt 174 lb (78.9 kg)   LMP 10/17/2021 (Approximate)   BMI 31.83 kg/m  Pt's initial BP reading was 151/100 pulse 104. Appears well, in no apparent distress  Results for orders placed or performed in visit on 12/10/21 (from the past 24 hour(s))  POCT urine pregnancy   Collection Time: 12/10/21 11:16 AM  Result Value Ref Range   Preg Test, Ur Positive (A) Negative    ASSESSMENT: Positive pregnancy test, 758w5dy LMP    PLAN: Schedule for dating ultrasound in 2 weeks with nurse interview.  Prenatal vitamins: note routed to [facility] send prescription   Nausea medicines: requested-note routed to [facility] send prescription   OB packet given: Yes  JaLevy Pupa11/14/2023 11:22 AM

## 2021-12-27 ENCOUNTER — Other Ambulatory Visit: Payer: Self-pay | Admitting: Obstetrics & Gynecology

## 2021-12-27 DIAGNOSIS — O3680X0 Pregnancy with inconclusive fetal viability, not applicable or unspecified: Secondary | ICD-10-CM

## 2021-12-30 ENCOUNTER — Ambulatory Visit (INDEPENDENT_AMBULATORY_CARE_PROVIDER_SITE_OTHER): Payer: BC Managed Care – PPO

## 2021-12-30 DIAGNOSIS — Z3A10 10 weeks gestation of pregnancy: Secondary | ICD-10-CM

## 2021-12-30 DIAGNOSIS — O3680X0 Pregnancy with inconclusive fetal viability, not applicable or unspecified: Secondary | ICD-10-CM | POA: Diagnosis not present

## 2021-12-30 NOTE — Progress Notes (Signed)
[place] 10+4 wks,single IUP,FHR 164 bpm,normal ovaries,multiple fibroids (#1) posterior subserosal fibroid 2.3 x 1.2 x 1.9 cm,(#2) posterior fundal subserosal fibroid 3 x 2.7 x 2.9 cm

## 2022-01-06 ENCOUNTER — Encounter: Payer: BC Managed Care – PPO | Admitting: *Deleted

## 2022-01-06 ENCOUNTER — Encounter: Payer: Self-pay | Admitting: Advanced Practice Midwife

## 2022-01-06 ENCOUNTER — Ambulatory Visit (INDEPENDENT_AMBULATORY_CARE_PROVIDER_SITE_OTHER): Payer: BC Managed Care – PPO | Admitting: Advanced Practice Midwife

## 2022-01-06 VITALS — BP 123/84 | HR 100 | Wt 169.0 lb

## 2022-01-06 DIAGNOSIS — Z3A11 11 weeks gestation of pregnancy: Secondary | ICD-10-CM | POA: Diagnosis not present

## 2022-01-06 DIAGNOSIS — I10 Essential (primary) hypertension: Secondary | ICD-10-CM | POA: Diagnosis not present

## 2022-01-06 DIAGNOSIS — O0990 Supervision of high risk pregnancy, unspecified, unspecified trimester: Secondary | ICD-10-CM | POA: Insufficient documentation

## 2022-01-06 DIAGNOSIS — Z363 Encounter for antenatal screening for malformations: Secondary | ICD-10-CM

## 2022-01-06 DIAGNOSIS — D252 Subserosal leiomyoma of uterus: Secondary | ICD-10-CM | POA: Diagnosis not present

## 2022-01-06 DIAGNOSIS — O0991 Supervision of high risk pregnancy, unspecified, first trimester: Secondary | ICD-10-CM | POA: Diagnosis not present

## 2022-01-06 LAB — POCT URINALYSIS DIPSTICK OB
Glucose, UA: NEGATIVE
Leukocytes, UA: NEGATIVE
Nitrite, UA: NEGATIVE

## 2022-01-06 MED ORDER — LABETALOL HCL 200 MG PO TABS: 200.0000 mg | ORAL_TABLET | Freq: Two times a day (BID) | ORAL | 3 refills | Status: DC

## 2022-01-06 NOTE — Progress Notes (Signed)
INITIAL OBSTETRICAL VISIT Patient name: Allison Prince MRN [id number]  Date of birth: [birth year] Chief Complaint:   Initial Prenatal Visit  History of Present Illness:   Allison Prince is a 31 y.o. G46P1011 African American female at 27w4dby LMP c/w u/s at 10 weeks with an Estimated Date of Delivery: 07/24/22 being seen today for her initial obstetrical visit.   Her obstetrical history is significant for  term SVD w/o problems. Has CHTN .  Was on Norvasc and HCTZ, will change to labetalol '200mg'$  BID Today she reports no complaints.     01/06/2022    3:32 PM 11/22/2020    2:36 PM 03/15/2018   10:38 AM 11/10/2017    3:14 PM 02/27/2017   11:09 AM  Depression screen PHQ 2/9  Decreased Interest 1 0 0 0 0  Down, Depressed, Hopeless 0 0 0 0 0  PHQ - 2 Score 1 0 0 0 0  Altered sleeping 2    1  Tired, decreased energy 2    1  Change in appetite 2    1  Feeling bad or failure about yourself  0    0  Trouble concentrating 0      Moving slowly or fidgety/restless 0    0  Suicidal thoughts 0    0  PHQ-9 Score 7    3  Difficult doing work/chores     Not difficult at all    Patient's last menstrual period was 10/17/2021 (approximate). Last pap 11/22/20. Results were: normal Review of Systems:   Pertinent items are noted in HPI Denies cramping/contractions, leakage of fluid, vaginal bleeding, abnormal vaginal discharge w/ itching/odor/irritation, headaches, visual changes, shortness of breath, chest pain, abdominal pain, severe nausea/vomiting, or problems with urination or bowel movements unless otherwise stated above.  Pertinent History Reviewed:  Reviewed past medical,surgical, social, obstetrical and family history.  Reviewed problem list, medications and allergies. OB History  Gravida Para Term Preterm AB Living  '3 1 1   1 1  '$ SAB IAB Ectopic Multiple Live Births      1 0 1    # Outcome Date GA Lbr Len/2nd Weight Sex Delivery Anes PTL Lv  3 Current           2 Ectopic 08/04/18  240w6d       1 Term 01/30/17 3925w3d:32 / 01:14 5 lb 15.2 oz (2.7 kg) F Vag-Spont EPI N LIV   Physical Assessment:   Vitals:   01/06/22 1545  BP: 123/84  Pulse: 100  Weight: 169 lb (76.7 kg)  Body mass index is 30.91 kg/m.       Physical Examination:  General appearance - well appearing, and in no distress  Mental status - alert, oriented to person, place, and time  Psych:  She has a normal mood and affect  Skin - warm and dry, normal color, no suspicious lesions noted  Chest - effort normal  Heart - normal rate and regular rhythm  Abdomen - soft, nontender  Extremities:  No swelling or varicosities noted   No results found for this or any previous visit (from the past 24 hour(s)).  Assessment & Plan:  1) High-Risk Pregnancy G3P1011 at 11w49w4dh an Estimated Date of Delivery: 07/24/22   2) Initial OB visit  3) CHTN: "allergic" to ASA (nausea/vomiting), start labetalol '200mg'$  BID  Meds:  Meds ordered this encounter  Medications   labetalol (NORMODYNE) 200 MG tablet    Sig: Take 1  tablet (200 mg total) by mouth 2 (two) times daily.    Dispense:  60 tablet    Refill:  3    Order Specific Question:   Supervising Provider    Answer:   Tania Ade H [[id number]]    Initial labs obtained Continue prenatal vitamins Reviewed n/v relief measures and warning s/s to report Reviewed recommended weight gain based on pre-gravid BMI Encouraged well-balanced diet Genetic & carrier screening discussed: requests Panorama, AFP, and Horizon , declines NT/IT Ultrasound discussed; fetal survey: requested CCNC completed> form faxed if has or is planning to apply for medicaid The nature of [place] for [employer] with multiple MDs and other Advanced Practice Providers was explained to patient; also emphasized that fellows, residents, and students are part of our team. Has  home bp cuff.. Check bp weekly, let us know if >140/90.        Joaquim Lai Cresenzo-Dishmon 4:15 PM

## 2022-01-06 NOTE — Addendum Note (Signed)
Addended by: Linton Rump on: 01/06/2022 04:24 PM   Modules accepted: Orders

## 2022-01-06 NOTE — Patient Instructions (Signed)
Allison Prince, I greatly value your feedback.  If you receive a survey following your visit with [place] today, we appreciate you taking the time to fill it out.  Thanks, [name], DNP, [place]!!! It is now [place] at [facility] ([place], [place] [address]) Entrance located off of [address] parking   Nausea & Vomiting Have saltine crackers or pretzels by your bed and eat a few bites before you raise your head out of bed in the morning Eat small frequent meals throughout the day instead of large meals Drink plenty of fluids throughout the day to stay hydrated, just don't drink a lot of fluids with your meals.  This can make your stomach fill up faster making you feel sick Do not brush your teeth right after you eat Products with real ginger are good for nausea, like ginger ale and ginger hard candy Make sure it says made with real ginger! Sucking on sour candy like lemon heads is also good for nausea If your prenatal vitamins make you nauseated, take them at night so you will sleep through the nausea Sea Bands If you feel like you need medicine for the nausea & vomiting please let us know If you are unable to keep any fluids or food down please let us know   Constipation Drink plenty of fluid, preferably water, throughout the day Eat foods high in fiber such as fruits, vegetables, and grains Exercise, such as walking, is a good way to keep your bowels regular Drink warm fluids, especially warm prune juice, or decaf coffee Eat a 1/2 cup of real oatmeal (not instant), 1/2 cup applesauce, and 1/2-1 cup warm prune juice every day If needed, you may take Colace (docusate sodium) stool softener once or twice a day to help keep the stool soft.  If you still are having problems with constipation, you may take Miralax once daily as needed to help keep your bowels regular.   Home Blood Pressure Monitoring for  Patients   Your provider has recommended that you check your blood pressure (BP) at least once a week at home. If you do not have a blood pressure cuff at home, one will be provided for you. Contact your provider if you have not received your monitor within 1 week.   Helpful Tips for Accurate Home Blood Pressure Checks  Don't smoke, exercise, or drink caffeine 30 minutes before checking your BP Use the restroom before checking your BP (a full bladder can raise your pressure) Relax in a comfortable upright chair Feet on the ground Left arm resting comfortably on a flat surface at the level of your heart Legs uncrossed Back supported Sit quietly and don't talk Place the cuff on your bare arm Adjust snuggly, so that only two fingertips can fit between your skin and the top of the cuff Check 2 readings separated by at least one minute Keep a log of your BP readings For a visual, please reference this diagram: http://ccnc.care/bpdiagram  Provider Name: [facility]     Phone: [phone]  Zone 1: ALL CLEAR  Continue to monitor your symptoms:  BP reading is less than 140 (top number) or less than 90 (bottom number)  No right upper stomach pain No headaches or seeing spots No feeling nauseated or throwing up No swelling in face and hands  Zone 2: CAUTION Call your doctor's office for any of the following:  BP reading is greater than 140 (top number) or greater than 90 (bottom number)  Stomach pain under your ribs in the middle or right side Headaches or seeing spots Feeling nauseated or throwing up Swelling in face and hands  Zone 3: EMERGENCY  Seek immediate medical care if you have any of the following:  BP reading is greater than160 (top number) or greater than 110 (bottom number) Severe headaches not improving with Tylenol Serious difficulty catching your breath Any worsening symptoms from Zone 2    First Trimester of Pregnancy The first trimester of pregnancy is from  week 1 until the end of week 12 (months 1 through 3). A week after a sperm fertilizes an egg, the egg will implant on the wall of the uterus. This embryo will begin to develop into a baby. Genes from you and your partner are forming the baby. The female genes determine whether the baby is a boy or a girl. At 6-8 weeks, the eyes and face are formed, and the heartbeat can be seen on ultrasound. At the end of 12 weeks, all the baby's organs are formed.  Now that you are pregnant, you will want to do everything you can to have a healthy baby. Two of the most important things are to get good prenatal care and to follow your health care provider's instructions. Prenatal care is all the medical care you receive before the baby's birth. This care will help prevent, find, and treat any problems during the pregnancy and childbirth. BODY CHANGES Your body goes through many changes during pregnancy. The changes vary from woman to woman.  You may gain or lose a couple of pounds at first. You may feel sick to your stomach (nauseous) and throw up (vomit). If the vomiting is uncontrollable, call your health care provider. You may tire easily. You may develop headaches that can be relieved by medicines approved by your health care provider. You may urinate more often. Painful urination may mean you have a bladder infection. You may develop heartburn as a result of your pregnancy. You may develop constipation because certain hormones are causing the muscles that push waste through your intestines to slow down. You may develop hemorrhoids or swollen, bulging veins (varicose veins). Your breasts may begin to grow larger and become tender. Your nipples may stick out more, and the tissue that surrounds them (areola) may become darker. Your gums may bleed and may be sensitive to brushing and flossing. Dark spots or blotches (chloasma, mask of pregnancy) may develop on your face. This will likely fade after the baby is  born. Your menstrual periods will stop. You may have a loss of appetite. You may develop cravings for certain kinds of food. You may have changes in your emotions from day to day, such as being excited to be pregnant or being concerned that something may go wrong with the pregnancy and baby. You may have more vivid and strange dreams. You may have changes in your hair. These can include thickening of your hair, rapid growth, and changes in texture. Some women also have hair loss during or after pregnancy, or hair that feels dry or thin. Your hair will most likely return to normal after your baby is born. WHAT TO EXPECT AT YOUR PRENATAL VISITS During a routine prenatal visit: You will be weighed to make sure you and the baby are growing normally. Your blood pressure will be taken. Your abdomen will be measured to track your baby's growth. The fetal  heartbeat will be listened to starting around week 10 or 12 of your pregnancy. Test results from any previous visits will be discussed. Your health care provider may ask you: How you are feeling. If you are feeling the baby move. If you have had any abnormal symptoms, such as leaking fluid, bleeding, severe headaches, or abdominal cramping. If you have any questions. Other tests that may be performed during your first trimester include: Blood tests to find your blood type and to check for the presence of any previous infections. They will also be used to check for low iron levels (anemia) and Rh antibodies. Later in the pregnancy, blood tests for diabetes will be done along with other tests if problems develop. Urine tests to check for infections, diabetes, or protein in the urine. An ultrasound to confirm the proper growth and development of the baby. An amniocentesis to check for possible genetic problems. Fetal screens for spina bifida and Down syndrome. You may need other tests to make sure you and the baby are doing well. HOME CARE  INSTRUCTIONS  Medicines Follow your health care provider's instructions regarding medicine use. Specific medicines may be either safe or unsafe to take during pregnancy. Take your prenatal vitamins as directed. If you develop constipation, try taking a stool softener if your health care provider approves. Diet Eat regular, well-balanced meals. Choose a variety of foods, such as meat or vegetable-based protein, fish, milk and low-fat dairy products, vegetables, fruits, and whole grain breads and cereals. Your health care provider will help you determine the amount of weight gain that is right for you. Avoid raw meat and uncooked cheese. These carry germs that can cause birth defects in the baby. Eating four or five small meals rather than three large meals a day may help relieve nausea and vomiting. If you start to feel nauseous, eating a few soda crackers can be helpful. Drinking liquids between meals instead of during meals also seems to help nausea and vomiting. If you develop constipation, eat more high-fiber foods, such as fresh vegetables or fruit and whole grains. Drink enough fluids to keep your urine clear or pale yellow. Activity and Exercise Exercise only as directed by your health care provider. Exercising will help you: Control your weight. Stay in shape. Be prepared for labor and delivery. Experiencing pain or cramping in the lower abdomen or low back is a good sign that you should stop exercising. Check with your health care provider before continuing normal exercises. Try to avoid standing for long periods of time. Move your legs often if you must stand in one place for a long time. Avoid heavy lifting. Wear low-heeled shoes, and practice good posture. You may continue to have sex unless your health care provider directs you otherwise. Relief of Pain or Discomfort Wear a good support bra for breast tenderness.   Take warm sitz baths to soothe any pain or discomfort caused by  hemorrhoids. Use hemorrhoid cream if your health care provider approves.   Rest with your legs elevated if you have leg cramps or low back pain. If you develop varicose veins in your legs, wear support hose. Elevate your feet for 15 minutes, 3-4 times a day. Limit salt in your diet. Prenatal Care Schedule your prenatal visits by the twelfth week of pregnancy. They are usually scheduled monthly at first, then more often in the last 2 months before delivery. Write down your questions. Take them to your prenatal visits. Keep all your prenatal visits as directed  by your health care provider. Safety Wear your seat belt at all times when driving. Make a list of emergency phone numbers, including numbers for family, friends, the hospital, and police and fire departments. General Tips Ask your health care provider for a referral to a local prenatal education class. Begin classes no later than at the beginning of month 6 of your pregnancy. Ask for help if you have counseling or nutritional needs during pregnancy. Your health care provider can offer advice or refer you to specialists for help with various needs. Do not use hot tubs, steam rooms, or saunas. Do not douche or use tampons or scented sanitary pads. Do not cross your legs for long periods of time. Avoid cat litter boxes and soil used by cats. These carry germs that can cause birth defects in the baby and possibly loss of the fetus by miscarriage or stillbirth. Avoid all smoking, herbs, alcohol, and medicines not prescribed by your health care provider. Chemicals in these affect the formation and growth of the baby. Schedule a dentist appointment. At home, brush your teeth with a soft toothbrush and be gentle when you floss. SEEK MEDICAL CARE IF:  You have dizziness. You have mild pelvic cramps, pelvic pressure, or nagging pain in the abdominal area. You have persistent nausea, vomiting, or diarrhea. You have a bad smelling vaginal  discharge. You have pain with urination. You notice increased swelling in your face, hands, legs, or ankles. SEEK IMMEDIATE MEDICAL CARE IF:  You have a fever. You are leaking fluid from your vagina. You have spotting or bleeding from your vagina. You have severe abdominal cramping or pain. You have rapid weight gain or loss. You vomit blood or material that looks like coffee grounds. You are exposed to [place] measles and have never had them. You are exposed to fifth disease or chickenpox. You develop a severe headache. You have shortness of breath. You have any kind of trauma, such as from a fall or a car accident. Document Released: 01/07/2001 Document Revised: 05/30/2013 Document Reviewed: 11/23/2012 [facility] Patient Information 2015 [place], [place]. This information is not intended to replace advice given to you by your health care provider. Make sure you discuss any questions you have with your health care provider.  Coronavirus (COVID-19) Are you at risk?  Are you at risk for the Coronavirus (COVID-19)?  To be considered HIGH RISK for Coronavirus (COVID-19), you have to meet the following criteria:  Traveled to [place], [place], [place], [place] or [place];  and have fever, cough, and shortness of breath within the last 2 weeks of travel OR Been in close contact with a person diagnosed with COVID-19 within the last 2 weeks and have fever, cough, and shortness of breath IF YOU DO NOT MEET THESE CRITERIA, YOU ARE CONSIDERED LOW RISK FOR COVID-19.  What to do if you are HIGH RISK for COVID-19?  If you are having a medical emergency, call 911. Seek medical care right away. Before you go to a doctor's office, urgent care or emergency department, call ahead and tell them about your recent travel, contact with someone diagnosed with COVID-19, and your symptoms. You should receive instructions from your physician's office regarding next steps of care.  When you arrive at healthcare provider,  tell the healthcare staff immediately you have returned from visiting [place], [place], [place], [place] or [place]; in the last two weeks or you have been in close contact with a person diagnosed with COVID-19 in the last 2 weeks.  Tell the health care staff about your symptoms: fever, cough and shortness of breath. After you have been seen by a medical provider, you will be either: Tested for (COVID-19) and discharged home on quarantine except to seek medical care if symptoms worsen, and asked to  Stay home and avoid contact with others until you get your results (4-5 days)  Avoid travel on public transportation if possible (such as bus, train, or airplane) or Sent to the Emergency Department by EMS for evaluation, COVID-19 testing, and possible admission depending on your condition and test results.  What to do if you are LOW RISK for COVID-19?  Reduce your risk of any infection by using the same precautions used for avoiding the common cold or flu:  Wash your hands often with soap and warm water for at least 20 seconds.  If soap and water are not readily available, use an alcohol-based hand sanitizer with at least 60% alcohol.  If coughing or sneezing, cover your mouth and nose by coughing or sneezing into the elbow areas of your shirt or coat, into a tissue or into your sleeve (not your hands). Avoid shaking hands with others and consider head nods or verbal greetings only. Avoid touching your eyes, nose, or mouth with unwashed hands.  Avoid close contact with people who are sick. Avoid places or events with large numbers of people in one location, like concerts or sporting events. Carefully consider travel plans you have or are making. If you are planning any travel outside or inside the [place], visit the CDC's Travelers' Health webpage for the latest health notices. If you have some symptoms but not all symptoms, continue to monitor at home and seek medical attention if your symptoms worsen. If  you are having a medical emergency, call 911.   ADDITIONAL HEALTHCARE OPTIONS FOR [place] / e-Visit: [email or web address]         [facility]: [facility]: [id number]                   [facility]: [phone]     Safe Medications in Pregnancy   Acne: Benzoyl Peroxide Salicylic Acid  Backache/Headache: Tylenol: 2 regular strength every 4 hours OR              2 Extra strength every 6 hours  Colds/Coughs/Allergies: Benadryl (alcohol free) 25 mg every 6 hours as needed Breath right strips Claritin Cepacol throat lozenges Chloraseptic throat spray Cold-Eeze- up to three times per day Cough drops, alcohol free Flonase (by prescription only) Guaifenesin Mucinex Robitussin DM (plain only, alcohol free) Saline nasal spray/drops Sudafed (pseudoephedrine) & Actifed ** use only after [redacted] weeks gestation and if you do not have high blood pressure Tylenol Vicks Vaporub Zinc lozenges Zyrtec   Constipation: Colace Ducolax suppositories Fleet enema Glycerin suppositories Metamucil Milk of magnesia Miralax Senokot Smooth move tea  Diarrhea: Kaopectate Imodium A-D  *NO pepto Bismol  Hemorrhoids: Anusol Anusol HC Preparation H Tucks  Indigestion: Tums Maalox Mylanta Zantac  Pepcid  Insomnia: Benadryl (alcohol free) 36m every 6 hours as needed Tylenol PM Unisom, no Gelcaps  Leg Cramps: Tums MagGel  Nausea/Vomiting:  Bonine Dramamine Emetrol Ginger extract Sea bands Meclizine  Nausea medication to take during pregnancy:  Unisom (doxylamine succinate 25 mg tablets) Take one tablet daily at bedtime. If symptoms are not adequately controlled, the dose can be increased to a maximum recommended dose of two tablets daily (1/2 tablet in  the morning, 1/2 tablet mid-afternoon and one at bedtime). Vitamin B6 160m tablets. Take one  tablet twice a day (up to 200 mg per day).  Skin Rashes: Aveeno products Benadryl cream or 250mevery 6 hours as needed Calamine Lotion 1% cortisone cream  Yeast infection: Gyne-lotrimin 7 Monistat 7   **If taking multiple medications, please check labels to avoid duplicating the same active ingredients **take medication as directed on the label ** Do not exceed 4000 mg of tylenol in 24 hours **Do not take medications that contain aspirin or ibuprofen

## 2022-01-07 LAB — CBC/D/PLT+RPR+RH+ABO+RUBIGG...
Basos: 0 %
Immature Grans (Abs): 0 10*3/uL (ref 0.0–0.1)
RPR Ser Ql: NONREACTIVE

## 2022-01-07 LAB — COMPREHENSIVE METABOLIC PANEL: Calcium: 10.5 mg/dL — ABNORMAL HIGH (ref 8.7–10.2)

## 2022-01-08 LAB — CBC/D/PLT+RPR+RH+ABO+RUBIGG...
Antibody Screen: NEGATIVE
Basophils Absolute: 0 10*3/uL (ref 0.0–0.2)
EOS (ABSOLUTE): 0.1 10*3/uL (ref 0.0–0.4)
Eos: 1 %
HCV Ab: NONREACTIVE
HIV Screen 4th Generation wRfx: NONREACTIVE
Hematocrit: 40.2 % (ref 34.0–46.6)
Hemoglobin: 13.5 g/dL (ref 11.1–15.9)
Hepatitis B Surface Ag: NEGATIVE
Immature Granulocytes: 0 %
Lymphocytes Absolute: 2.2 10*3/uL (ref 0.7–3.1)
Lymphs: 25 %
MCH: 29.5 pg (ref 26.6–33.0)
MCHC: 33.6 g/dL (ref 31.5–35.7)
MCV: 88 fL (ref 79–97)
Monocytes Absolute: 0.8 10*3/uL (ref 0.1–0.9)
Monocytes: 9 %
Neutrophils Absolute: 5.5 10*3/uL (ref 1.4–7.0)
Neutrophils: 65 %
Platelets: 458 10*3/uL — ABNORMAL HIGH (ref 150–450)
RBC: 4.58 x10E6/uL (ref 3.77–5.28)
RDW: 12.5 % (ref 11.7–15.4)
Rh Factor: POSITIVE
Rubella Antibodies, IGG: 10.1 index (ref >0.99)
WBC: 8.6 10*3/uL (ref 3.4–10.8)

## 2022-01-08 LAB — COMPREHENSIVE METABOLIC PANEL
ALT: 24 IU/L (ref 0–32)
AST: 19 IU/L (ref 0–40)
Albumin/Globulin Ratio: 1.8 (ref 1.2–2.2)
Albumin: 4.8 g/dL (ref 3.9–4.9)
Alkaline Phosphatase: 40 IU/L — ABNORMAL LOW (ref 44–121)
BUN/Creatinine Ratio: 15 (ref 9–23)
BUN: 10 mg/dL (ref 6–20)
Bilirubin Total: <0.2 mg/dL (ref 0.0–1.2)
CO2: 18 mmol/L — ABNORMAL LOW (ref 20–29)
Chloride: 99 mmol/L (ref 96–106)
Creatinine, Ser: 0.67 mg/dL (ref 0.57–1.00)
Globulin, Total: 2.6 g/dL (ref 1.5–4.5)
Glucose: 81 mg/dL (ref 70–99)
Potassium: 4.2 mmol/L (ref 3.5–5.2)
Sodium: 135 mmol/L (ref 134–144)
Total Protein: 7.4 g/dL (ref 6.0–8.5)
eGFR: 119 mL/min/{1.73_m2} (ref >59)

## 2022-01-08 LAB — PROTEIN / CREATININE RATIO, URINE
Creatinine, Urine: 367.8 mg/dL
Protein, Ur: 55.6 mg/dL
Protein/Creat Ratio: 151 mg/g creat (ref 0–200)

## 2022-01-08 LAB — HCV INTERPRETATION

## 2022-01-08 LAB — GC/CHLAMYDIA PROBE AMP
Chlamydia trachomatis, NAA: NEGATIVE
Neisseria Gonorrhoeae by PCR: NEGATIVE

## 2022-01-08 LAB — URINE CULTURE

## 2022-01-09 ENCOUNTER — Encounter: Payer: BC Managed Care – PPO | Admitting: Advanced Practice Midwife

## 2022-01-09 ENCOUNTER — Telehealth (INDEPENDENT_AMBULATORY_CARE_PROVIDER_SITE_OTHER): Payer: BC Managed Care – PPO | Admitting: *Deleted

## 2022-01-09 ENCOUNTER — Encounter: Payer: BC Managed Care – PPO | Admitting: *Deleted

## 2022-01-09 VITALS — BP 117/71 | HR 105

## 2022-01-09 DIAGNOSIS — F17210 Nicotine dependence, cigarettes, uncomplicated: Secondary | ICD-10-CM

## 2022-01-09 DIAGNOSIS — Z3A12 12 weeks gestation of pregnancy: Secondary | ICD-10-CM

## 2022-01-09 DIAGNOSIS — O0991 Supervision of high risk pregnancy, unspecified, first trimester: Secondary | ICD-10-CM

## 2022-01-09 DIAGNOSIS — O10011 Pre-existing essential hypertension complicating pregnancy, first trimester: Secondary | ICD-10-CM

## 2022-01-09 DIAGNOSIS — O99331 Smoking (tobacco) complicating pregnancy, first trimester: Secondary | ICD-10-CM

## 2022-01-09 DIAGNOSIS — I10 Essential (primary) hypertension: Secondary | ICD-10-CM

## 2022-01-09 NOTE — Progress Notes (Addendum)
   NURSE VISIT- BLOOD PRESSURE CHECK  I connected with Scherrie November on 01/09/2022 by MyChart video and verified that I am speaking with the correct person using two identifiers.   I discussed the limitations of evaluation and management by telemedicine. The patient expressed understanding and agreed to proceed.  Nurse is at the office, and patient is at work.  SUBJECTIVE:  Allison Prince is a 31 y.o. G45P1011 female here for BP check. She is 18w0dpregnant    HYPERTENSION ROS:  Pregnant:  Severe headaches that don't go away with tylenol/other medicines: No  Visual changes (seeing spots/double/blurred vision) No  Severe pain under right breast breast or in center of upper chest No  Severe nausea/vomiting No  Taking medicines as instructed yes    OBJECTIVE:  BP 117/71 (BP Location: Left Arm, Patient Position: Sitting, Cuff Size: Normal)   Pulse (!) 105   LMP 10/17/2021 (Approximate)   Appearance alert, well appearing, and in no distress.  ASSESSMENT: Pregnancy 136w0dblood pressure check  PLAN: Discussed with FrNigel BertholdCNM   Recommendations: no changes needed   Follow-up: as scheduled   LaAlice Rieger12/14/2023 3:19 PM   Chart reviewed for nurse visit. Agree with plan of care.  CrChristin FudgeCNArbuckle2/14/2023 7:00 PM

## 2022-01-12 LAB — PANORAMA PRENATAL TEST FULL PANEL:PANORAMA TEST PLUS 5 ADDITIONAL MICRODELETIONS: FETAL FRACTION: 9.2

## 2022-01-20 LAB — HORIZON CUSTOM: REPORT SUMMARY: NEGATIVE

## 2022-01-27 ENCOUNTER — Encounter: Payer: Self-pay | Admitting: Advanced Practice Midwife

## 2022-01-27 NOTE — L&D Delivery Note (Cosign Needed Addendum)
LABOR COURSE IOL for CHTN. Augmentation with Cytotec and pitocin. Normal progression of labor. AROM 1507.   Delivery Note Called to room and patient was near complete. After several contraction patient was complete and pushing. Head delivered. Loose nuchal, reduced prior to delivery. Shoulder and body delivered in usual fashion. At  1904 a viable female was delivered via Vaginal, Spontaneous (Presentation: OA).  Infant with spontaneous cry, placed on mother's abdomen, dried and stimulated. Cord clamped x 2 after 1-minute delay, and cut by aunt of baby. Cord blood drawn. Placenta delivered spontaneously with gentle cord traction. Appears intact. Fundus firm with massage and Pitocin. Labia, perineum, vagina, and cervix inspected with 2 cm right periurethral laceration, and 1 cm left periurethral laceration.    APGAR: 8,9 ; weight 2460g (5lb 6.8 oz) .   Cord: 3VC   Cord pH: Not sent  Anesthesia: Epidural Episiotomy: None Lacerations: Right peri-urethral (repaired), minor left periurethral (not repaired) Suture Repair:  4.0 monocryl Est. Blood Loss (mL): 108  Mom to postpartum.  Baby to Couplet care / Skin to Skin.  Tiffany Kocher, DO 07/11/2022 7:32 PM    Fellow ATTESTATION  I was present and gloved for this delivery and agree with the above documentation in the resident's note except as below.  Celedonio Savage, MD [facility] [employer] (Faculty Practice) 07/11/2022, 7:39 PM

## 2022-02-03 ENCOUNTER — Encounter: Payer: Self-pay | Admitting: Advanced Practice Midwife

## 2022-02-03 ENCOUNTER — Ambulatory Visit (INDEPENDENT_AMBULATORY_CARE_PROVIDER_SITE_OTHER): Payer: BC Managed Care – PPO | Admitting: Advanced Practice Midwife

## 2022-02-03 VITALS — BP 107/73 | HR 96 | Wt 166.4 lb

## 2022-02-03 DIAGNOSIS — O0991 Supervision of high risk pregnancy, unspecified, first trimester: Secondary | ICD-10-CM

## 2022-02-03 DIAGNOSIS — I10 Essential (primary) hypertension: Secondary | ICD-10-CM

## 2022-02-03 DIAGNOSIS — Z3A15 15 weeks gestation of pregnancy: Secondary | ICD-10-CM

## 2022-02-03 NOTE — Patient Instructions (Signed)
Allison Prince, I greatly value your feedback.  If you receive a survey following your visit with [place] today, we appreciate you taking the time to fill it out.  Thanks, Allison Prince, CNM     [place]!!! It is now [place] at [facility] ([place], [place] [address]) Entrance located off of [place] parking   Go to [email or web address] to register for FREE online childbirth classes    Second Trimester of Pregnancy The second trimester is from week 14 through week 27 (months 4 through 6). The second trimester is often a time when you feel your best. Your body has adjusted to being pregnant, and you begin to feel better physically. Usually, morning sickness has lessened or quit completely, you may have more energy, and you may have an increase in appetite. The second trimester is also a time when the fetus is growing rapidly. At the end of the sixth month, the fetus is about 9 inches long and weighs about 1 pounds. You will likely begin to feel the baby move (quickening) between 16 and 20 weeks of pregnancy. Body changes during your second trimester Your body continues to go through many changes during your second trimester. The changes vary from woman to woman. Your weight will continue to increase. You will notice your lower abdomen bulging out. You may begin to get stretch marks on your hips, abdomen, and breasts. You may develop headaches that can be relieved by medicines. The medicines should be approved by your health care provider. You may urinate more often because the fetus is pressing on your bladder. You may develop or continue to have heartburn as a result of your pregnancy. You may develop constipation because certain hormones are causing the muscles that push waste through your intestines to slow down. You may develop hemorrhoids or swollen, bulging veins (varicose veins). You may have back pain. This  is caused by: Weight gain. Pregnancy hormones that are relaxing the joints in your pelvis. A shift in weight and the muscles that support your balance. Your breasts will continue to grow and they will continue to become tender. Your gums may bleed and may be sensitive to brushing and flossing. Dark spots or blotches (chloasma, mask of pregnancy) may develop on your face. This will likely fade after the baby is born. A dark line from your belly button to the pubic area (linea nigra) may appear. This will likely fade after the baby is born. You may have changes in your hair. These can include thickening of your hair, rapid growth, and changes in texture. Some women also have hair loss during or after pregnancy, or hair that feels dry or thin. Your hair will most likely return to normal after your baby is born.  What to expect at prenatal visits During a routine prenatal visit: You will be weighed to make sure you and the fetus are growing normally. Your blood pressure will be taken. Your abdomen will be measured to track your baby's growth. The fetal heartbeat will be listened to. Any test results from the previous visit will be discussed.  Your health care provider may ask you: How you are feeling. If you are feeling the baby move. If you have had any abnormal symptoms, such as leaking fluid, bleeding, severe headaches, or abdominal cramping. If you are using any tobacco products, including cigarettes, chewing tobacco, and electronic cigarettes. If you have any questions.  Other tests  that may be performed during your second trimester include: Blood tests that check for: Low iron levels (anemia). High blood sugar that affects pregnant women (gestational diabetes) between 54 and 28 weeks. Rh antibodies. This is to check for a protein on red blood cells (Rh factor). Urine tests to check for infections, diabetes, or protein in the urine. An ultrasound to confirm the proper growth and  development of the baby. An amniocentesis to check for possible genetic problems. Fetal screens for spina bifida and Down syndrome. HIV (human immunodeficiency virus) testing. Routine prenatal testing includes screening for HIV, unless you choose not to have this test.  Follow these instructions at home: Medicines Follow your health care provider's instructions regarding medicine use. Specific medicines may be either safe or unsafe to take during pregnancy. Take a prenatal vitamin that contains at least 600 micrograms (mcg) of folic acid. If you develop constipation, try taking a stool softener if your health care provider approves. Eating and drinking Eat a balanced diet that includes fresh fruits and vegetables, whole grains, good sources of protein such as meat, eggs, or tofu, and low-fat dairy. Your health care provider will help you determine the amount of weight gain that is right for you. Avoid raw meat and uncooked cheese. These carry germs that can cause birth defects in the baby. If you have low calcium intake from food, talk to your health care provider about whether you should take a daily calcium supplement. Limit foods that are high in fat and processed sugars, such as fried and sweet foods. To prevent constipation: Drink enough fluid to keep your urine clear or pale yellow. Eat foods that are high in fiber, such as fresh fruits and vegetables, whole grains, and beans. Activity Exercise only as directed by your health care provider. Most women can continue their usual exercise routine during pregnancy. Try to exercise for 30 minutes at least 5 days a week. Stop exercising if you experience uterine contractions. Avoid heavy lifting, wear low heel shoes, and practice good posture. A sexual relationship may be continued unless your health care provider directs you otherwise. Relieving pain and discomfort Wear a good support bra to prevent discomfort from breast tenderness. Take  warm sitz baths to soothe any pain or discomfort caused by hemorrhoids. Use hemorrhoid cream if your health care provider approves. Rest with your legs elevated if you have leg cramps or low back pain. If you develop varicose veins, wear support hose. Elevate your feet for 15 minutes, 3-4 times a day. Limit salt in your diet. Prenatal Care Write down your questions. Take them to your prenatal visits. Keep all your prenatal visits as told by your health care provider. This is important. Safety Wear your seat belt at all times when driving. Make a list of emergency phone numbers, including numbers for family, friends, the hospital, and police and fire departments. General instructions Ask your health care provider for a referral to a local prenatal education class. Begin classes no later than the beginning of month 6 of your pregnancy. Ask for help if you have counseling or nutritional needs during pregnancy. Your health care provider can offer advice or refer you to specialists for help with various needs. Do not use hot tubs, steam rooms, or saunas. Do not douche or use tampons or scented sanitary pads. Do not cross your legs for long periods of time. Avoid cat litter boxes and soil used by cats. These carry germs that can cause birth defects in the baby  and possibly loss of the fetus by miscarriage or stillbirth. Avoid all smoking, herbs, alcohol, and unprescribed drugs. Chemicals in these products can affect the formation and growth of the baby. Do not use any products that contain nicotine or tobacco, such as cigarettes and e-cigarettes. If you need help quitting, ask your health care provider. Visit your dentist if you have not gone yet during your pregnancy. Use a soft toothbrush to brush your teeth and be gentle when you floss. Contact a health care provider if: You have dizziness. You have mild pelvic cramps, pelvic pressure, or nagging pain in the abdominal area. You have persistent  nausea, vomiting, or diarrhea. You have a bad smelling vaginal discharge. You have pain when you urinate. Get help right away if: You have a fever. You are leaking fluid from your vagina. You have spotting or bleeding from your vagina. You have severe abdominal cramping or pain. You have rapid weight gain or weight loss. You have shortness of breath with chest pain. You notice sudden or extreme swelling of your face, hands, ankles, feet, or legs. You have not felt your baby move in over an hour. You have severe headaches that do not go away when you take medicine. You have vision changes. Summary The second trimester is from week 14 through week 27 (months 4 through 6). It is also a time when the fetus is growing rapidly. Your body goes through many changes during pregnancy. The changes vary from woman to woman. Avoid all smoking, herbs, alcohol, and unprescribed drugs. These chemicals affect the formation and growth your baby. Do not use any tobacco products, such as cigarettes, chewing tobacco, and e-cigarettes. If you need help quitting, ask your health care provider. Contact your health care provider if you have any questions. Keep all prenatal visits as told by your health care provider. This is important. This information is not intended to replace advice given to you by your health care provider. Make sure you discuss any questions you have with your health care provider.

## 2022-02-03 NOTE — Progress Notes (Signed)
HIGH-RISK PREGNANCY VISIT Patient name: Allison Prince MRN [id number]  Date of birth: 1990-09-28 Chief Complaint:   Routine Prenatal Visit (Pressure lower stomach)  History of Present Illness:   Allison Prince is a 32 y.o. G24P1011 female at 62w4dwith an Estimated Date of Delivery: 07/24/22 being seen today for ongoing management of a high-risk pregnancy complicated by chronic hypertension currently on Labetalol '200mg'$  QD. (Was feeling odd with BID, BP is low, so just keep an eye on it. If BP goes up in the evening, split the tablet in 1/2 and take 1/2 in the am and the other 1/2 in the pm. Appetite is poor. Try liquid protein shakes/Ensure, etc.   Today she reports no complaints other than normal pregnancy complaints.. Contractions: Not present.  .  Movement: Absent. denies leaking of fluid.      01/06/2022    3:32 PM 11/22/2020    2:36 PM 03/15/2018   10:38 AM 11/10/2017    3:14 PM 02/27/2017   11:09 AM  Depression screen PHQ 2/9  Decreased Interest 1 0 0 0 0  Down, Depressed, Hopeless 0 0 0 0 0  PHQ - 2 Score 1 0 0 0 0  Altered sleeping 2    1  Tired, decreased energy 2    1  Change in appetite 2    1  Feeling bad or failure about yourself  0    0  Trouble concentrating 0      Moving slowly or fidgety/restless 0    0  Suicidal thoughts 0    0  PHQ-9 Score 7    3  Difficult doing work/chores     Not difficult at all        01/06/2022    3:32 PM  GAD 7 : Generalized Anxiety Score  Nervous, Anxious, on Edge 0  Control/stop worrying 1  Worry too much - different things 1  Trouble relaxing 0  Restless 0  Easily annoyed or irritable 2  Afraid - awful might happen 0  Total GAD 7 Score 4     Review of Systems:   Pertinent items are noted in HPI Denies abnormal vaginal discharge w/ itching/odor/irritation, headaches, visual changes, shortness of breath, chest pain, abdominal pain, severe nausea/vomiting, or problems with urination or bowel movements unless otherwise  stated above. Pertinent History Reviewed:  Reviewed past medical,surgical, social, obstetrical and family history.  Reviewed problem list, medications and allergies. Physical Assessment:   Vitals:   02/03/22 1341  BP: 107/73  Pulse: 96  Weight: 166 lb 6.4 oz (75.5 kg)  Body mass index is 30.43 kg/m.           Physical Examination:   General appearance: alert, well appearing, and in no distress  Mental status: alert, oriented to person, place, and time  Skin: warm & dry   Extremities: Edema: None    Cardiovascular: normal heart rate noted  Respiratory: normal respiratory effort, no distress  Abdomen: gravid, soft, non-tender  Pelvic: Cervical exam deferred         Fetal Status: Fetal Heart Rate (bpm): 148   Movement: Absent    Fetal Surveillance Testing today: doppler   Chaperone: N/A    No results found for this or any previous visit (from the past 24 hour(s)).  Assessment & Plan:  High-risk pregnancy: G3P1011 at 178w4dith an Estimated Date of Delivery: 07/24/22      ICD-10-CM   1. Supervision of high risk pregnancy in first  trimester  O09.91     2. Primary hypertension  I10         Meds: No orders of the defined types were placed in this encounter.   Orders: No orders of the defined types were placed in this encounter.    Labs/procedures today: none   Reviewed:  general obstetric precautions including but not limited to vaginal bleeding, contractions, leaking of fluid and fetal movement were reviewed in detail with the patient.  All questions were answered. Does have home bp cuff. Office bp cuff given: not applicable. Check bp weekly, let us know if consistently >140 and/or >90.  Follow-up: Return for As scheduled.   Future Appointments  Date Time Provider [place]  03/03/2022 10:00 AM [facility] [place] [facility] None  03/03/2022 10:50 AM Cresenzo-Dishmon, Joaquim Lai, CNM [facility]    No orders of the defined types were placed in this  encounter.  Christin Fudge , DNP, [facility] 02/03/2022 2:09 PM

## 2022-03-03 ENCOUNTER — Ambulatory Visit (INDEPENDENT_AMBULATORY_CARE_PROVIDER_SITE_OTHER): Payer: BC Managed Care – PPO | Admitting: Advanced Practice Midwife

## 2022-03-03 ENCOUNTER — Encounter: Payer: Self-pay | Admitting: Advanced Practice Midwife

## 2022-03-03 ENCOUNTER — Ambulatory Visit (INDEPENDENT_AMBULATORY_CARE_PROVIDER_SITE_OTHER): Payer: BC Managed Care – PPO

## 2022-03-03 VITALS — BP 113/75 | HR 82 | Wt 166.0 lb

## 2022-03-03 DIAGNOSIS — Z363 Encounter for antenatal screening for malformations: Secondary | ICD-10-CM

## 2022-03-03 DIAGNOSIS — D252 Subserosal leiomyoma of uterus: Secondary | ICD-10-CM | POA: Diagnosis not present

## 2022-03-03 DIAGNOSIS — Z3A19 19 weeks gestation of pregnancy: Secondary | ICD-10-CM

## 2022-03-03 DIAGNOSIS — O0992 Supervision of high risk pregnancy, unspecified, second trimester: Secondary | ICD-10-CM

## 2022-03-03 DIAGNOSIS — I10 Essential (primary) hypertension: Secondary | ICD-10-CM

## 2022-03-03 DIAGNOSIS — Z3482 Encounter for supervision of other normal pregnancy, second trimester: Secondary | ICD-10-CM

## 2022-03-03 DIAGNOSIS — O0991 Supervision of high risk pregnancy, unspecified, first trimester: Secondary | ICD-10-CM | POA: Diagnosis not present

## 2022-03-03 NOTE — Progress Notes (Signed)
[place] 19+4 wks,breech,FHR 154 bpm,posterior placenta gr 0,CX 3.3 cm,SVP of fluid 5.2 cm,multiple fibroids N/C,normal ovaries,EFW 293 g 34%,anatomy complete,no obvious abnormalities

## 2022-03-03 NOTE — Progress Notes (Addendum)
HIGH-RISK PREGNANCY VISIT Patient name: Allison Prince MRN [id number]  Date of birth: 03-17-1990 Chief Complaint:   Routine Prenatal Visit ("No energy")  History of Present Illness:   Allison Prince is a 32 y.o. G56P1011 female at 3w4dwith an Estimated Date of Delivery: 07/24/22 being seen today for ongoing management of a high-risk pregnancy complicated by chronic hypertension currently on Labetalol '200mg'$  QD    Today she reports low energy once she sits down for the day. Does OK when up. Sleeps well.  Drinking protein shakes.   Mom had a stroke, doing OK. Smoking more now d/t stress. Wants to cut back down. . Contractions: Not present. Vag. Bleeding: None.  Movement: Present. denies leaking of fluid.      01/06/2022    3:32 PM 11/22/2020    2:36 PM 03/15/2018   10:38 AM 11/10/2017    3:14 PM 02/27/2017   11:09 AM  Depression screen PHQ 2/9  Decreased Interest 1 0 0 0 0  Down, Depressed, Hopeless 0 0 0 0 0  PHQ - 2 Score 1 0 0 0 0  Altered sleeping 2    1  Tired, decreased energy 2    1  Change in appetite 2    1  Feeling bad or failure about yourself  0    0  Trouble concentrating 0      Moving slowly or fidgety/restless 0    0  Suicidal thoughts 0    0  PHQ-9 Score 7    3  Difficult doing work/chores     Not difficult at all        01/06/2022    3:32 PM  GAD 7 : Generalized Anxiety Score  Nervous, Anxious, on Edge 0  Control/stop worrying 1  Worry too much - different things 1  Trouble relaxing 0  Restless 0  Easily annoyed or irritable 2  Afraid - awful might happen 0  Total GAD 7 Score 4     Review of Systems:   Pertinent items are noted in HPI Denies abnormal vaginal discharge w/ itching/odor/irritation, headaches, visual changes, shortness of breath, chest pain, abdominal pain, severe nausea/vomiting, or problems with urination or bowel movements unless otherwise stated above. Pertinent History Reviewed:  Reviewed past medical,surgical, social,  obstetrical and family history.  Reviewed problem list, medications and allergies. Physical Assessment:   Vitals:   03/03/22 1106  BP: 113/75  Pulse: 82  Weight: 166 lb (75.3 kg)  Body mass index is 30.36 kg/m.           Physical Examination:   General appearance: alert, well appearing, and in no distress  Mental status: alert, oriented to person, place, and time  Skin: warm & dry   Extremities: Edema: None    Cardiovascular: normal heart rate noted  Respiratory: normal respiratory effort, no distress  Abdomen: gravid, soft, non-tender  Pelvic: Cervical exam deferred         Fetal Status:     Movement: Present    Fetal Surveillance Testing today: [place]19+4 wks,breech,FHR 154 bpm,posterior placenta gr 0,CX 3.3 cm,SVP of fluid 5.2 cm,multiple fibroids N/C,normal ovaries,EFW 293 g 34%,anatomy complete,no obvious abnormalities    Chaperone: N/A    No results found for this or any previous visit (from the past 24 hour(s)).  Assessment & Plan:  High-risk pregnancy: G3P1011 at 125w4dith an Estimated Date of Delivery: 07/24/22      ICD-10-CM   1. [redacted] weeks gestation of pregnancy  Z3A.19 US OB Follow Up    2. Supervision of normal intrauterine pregnancy in multigravida, second trimester  Z34.82     3. Supervision of high risk pregnancy in second trimester  O09.92     4. Primary hypertension  I10 US OB Follow Up        Meds: No orders of the defined types were placed in this encounter.   Orders:  Orders Placed This Encounter  Procedures   US OB Follow Up     Labs/procedures today: U/S   Reviewed: general obstetric precautions including but not limited to vaginal bleeding, contractions, leaking of fluid and fetal movement were reviewed in detail with the patient.  All questions were answered. Does have home bp cuff. Office bp cuff given: not applicable. Check bp weekly, let us know if consistently >140 and/or >90.  Follow-up: Return in about 4 weeks (around 03/31/2022) for  HROB w/CNM or MD, US:OB F/U:efw.   No future appointments.   Orders Placed This Encounter  Procedures   US OB Follow Up   Christin Fudge , DNP, [facility] 03/03/2022 11:33 AM

## 2022-03-03 NOTE — Patient Instructions (Signed)
Allison Prince, I greatly value your feedback.  If you receive a survey following your visit with [place] today, we appreciate you taking the time to fill it out.  Thanks, [name], CNM     [place]!!! It is now [place] at [facility] ([place], [place] [address]) Entrance located off of [address] parking   Go to [email or web address] to register for FREE online childbirth classes    Second Trimester of Pregnancy The second trimester is from week 14 through week 27 (months 4 through 6). The second trimester is often a time when you feel your best. Your body has adjusted to being pregnant, and you begin to feel better physically. Usually, morning sickness has lessened or quit completely, you may have more energy, and you may have an increase in appetite. The second trimester is also a time when the fetus is growing rapidly. At the end of the sixth month, the fetus is about 9 inches long and weighs about 1 pounds. You will likely begin to feel the baby move (quickening) between 16 and 20 weeks of pregnancy. Body changes during your second trimester Your body continues to go through many changes during your second trimester. The changes vary from woman to woman. Your weight will continue to increase. You will notice your lower abdomen bulging out. You may begin to get stretch marks on your hips, abdomen, and breasts. You may develop headaches that can be relieved by medicines. The medicines should be approved by your health care provider. You may urinate more often because the fetus is pressing on your bladder. You may develop or continue to have heartburn as a result of your pregnancy. You may develop constipation because certain hormones are causing the muscles that push waste through your intestines to slow down. You may develop hemorrhoids or swollen, bulging veins (varicose veins). You may have back pain. This  is caused by: Weight gain. Pregnancy hormones that are relaxing the joints in your pelvis. A shift in weight and the muscles that support your balance. Your breasts will continue to grow and they will continue to become tender. Your gums may bleed and may be sensitive to brushing and flossing. Dark spots or blotches (chloasma, mask of pregnancy) may develop on your face. This will likely fade after the baby is born. A dark line from your belly button to the pubic area (linea nigra) may appear. This will likely fade after the baby is born. You may have changes in your hair. These can include thickening of your hair, rapid growth, and changes in texture. Some women also have hair loss during or after pregnancy, or hair that feels dry or thin. Your hair will most likely return to normal after your baby is born.  What to expect at prenatal visits During a routine prenatal visit: You will be weighed to make sure you and the fetus are growing normally. Your blood pressure will be taken. Your abdomen will be measured to track your baby's growth. The fetal heartbeat will be listened to. Any test results from the previous visit will be discussed.  Your health care provider may ask you: How you are feeling. If you are feeling the baby move. If you have had any abnormal symptoms, such as leaking fluid, bleeding, severe headaches, or abdominal cramping. If you are using any tobacco products, including cigarettes, chewing tobacco, and electronic cigarettes. If you have any questions.  Other tests  that may be performed during your second trimester include: Blood tests that check for: Low iron levels (anemia). High blood sugar that affects pregnant women (gestational diabetes) between 54 and 28 weeks. Rh antibodies. This is to check for a protein on red blood cells (Rh factor). Urine tests to check for infections, diabetes, or protein in the urine. An ultrasound to confirm the proper growth and  development of the baby. An amniocentesis to check for possible genetic problems. Fetal screens for spina bifida and Down syndrome. HIV (human immunodeficiency virus) testing. Routine prenatal testing includes screening for HIV, unless you choose not to have this test.  Follow these instructions at home: Medicines Follow your health care provider's instructions regarding medicine use. Specific medicines may be either safe or unsafe to take during pregnancy. Take a prenatal vitamin that contains at least 600 micrograms (mcg) of folic acid. If you develop constipation, try taking a stool softener if your health care provider approves. Eating and drinking Eat a balanced diet that includes fresh fruits and vegetables, whole grains, good sources of protein such as meat, eggs, or tofu, and low-fat dairy. Your health care provider will help you determine the amount of weight gain that is right for you. Avoid raw meat and uncooked cheese. These carry germs that can cause birth defects in the baby. If you have low calcium intake from food, talk to your health care provider about whether you should take a daily calcium supplement. Limit foods that are high in fat and processed sugars, such as fried and sweet foods. To prevent constipation: Drink enough fluid to keep your urine clear or pale yellow. Eat foods that are high in fiber, such as fresh fruits and vegetables, whole grains, and beans. Activity Exercise only as directed by your health care provider. Most women can continue their usual exercise routine during pregnancy. Try to exercise for 30 minutes at least 5 days a week. Stop exercising if you experience uterine contractions. Avoid heavy lifting, wear low heel shoes, and practice good posture. A sexual relationship may be continued unless your health care provider directs you otherwise. Relieving pain and discomfort Wear a good support bra to prevent discomfort from breast tenderness. Take  warm sitz baths to soothe any pain or discomfort caused by hemorrhoids. Use hemorrhoid cream if your health care provider approves. Rest with your legs elevated if you have leg cramps or low back pain. If you develop varicose veins, wear support hose. Elevate your feet for 15 minutes, 3-4 times a day. Limit salt in your diet. Prenatal Care Write down your questions. Take them to your prenatal visits. Keep all your prenatal visits as told by your health care provider. This is important. Safety Wear your seat belt at all times when driving. Make a list of emergency phone numbers, including numbers for family, friends, the hospital, and police and fire departments. General instructions Ask your health care provider for a referral to a local prenatal education class. Begin classes no later than the beginning of month 6 of your pregnancy. Ask for help if you have counseling or nutritional needs during pregnancy. Your health care provider can offer advice or refer you to specialists for help with various needs. Do not use hot tubs, steam rooms, or saunas. Do not douche or use tampons or scented sanitary pads. Do not cross your legs for long periods of time. Avoid cat litter boxes and soil used by cats. These carry germs that can cause birth defects in the baby  and possibly loss of the fetus by miscarriage or stillbirth. Avoid all smoking, herbs, alcohol, and unprescribed drugs. Chemicals in these products can affect the formation and growth of the baby. Do not use any products that contain nicotine or tobacco, such as cigarettes and e-cigarettes. If you need help quitting, ask your health care provider. Visit your dentist if you have not gone yet during your pregnancy. Use a soft toothbrush to brush your teeth and be gentle when you floss. Contact a health care provider if: You have dizziness. You have mild pelvic cramps, pelvic pressure, or nagging pain in the abdominal area. You have persistent  nausea, vomiting, or diarrhea. You have a bad smelling vaginal discharge. You have pain when you urinate. Get help right away if: You have a fever. You are leaking fluid from your vagina. You have spotting or bleeding from your vagina. You have severe abdominal cramping or pain. You have rapid weight gain or weight loss. You have shortness of breath with chest pain. You notice sudden or extreme swelling of your face, hands, ankles, feet, or legs. You have not felt your baby move in over an hour. You have severe headaches that do not go away when you take medicine. You have vision changes. Summary The second trimester is from week 14 through week 27 (months 4 through 6). It is also a time when the fetus is growing rapidly. Your body goes through many changes during pregnancy. The changes vary from woman to woman. Avoid all smoking, herbs, alcohol, and unprescribed drugs. These chemicals affect the formation and growth your baby. Do not use any tobacco products, such as cigarettes, chewing tobacco, and e-cigarettes. If you need help quitting, ask your health care provider. Contact your health care provider if you have any questions. Keep all prenatal visits as told by your health care provider. This is important. This information is not intended to replace advice given to you by your health care provider. Make sure you discuss any questions you have with your health care provider.

## 2022-03-03 NOTE — Addendum Note (Signed)
Addended by: Christin Fudge on: 03/03/2022 11:33 AM   Modules accepted: Orders

## 2022-04-02 ENCOUNTER — Ambulatory Visit (INDEPENDENT_AMBULATORY_CARE_PROVIDER_SITE_OTHER): Payer: BC Managed Care – PPO

## 2022-04-02 ENCOUNTER — Encounter: Payer: Self-pay | Admitting: Advanced Practice Midwife

## 2022-04-02 ENCOUNTER — Ambulatory Visit (INDEPENDENT_AMBULATORY_CARE_PROVIDER_SITE_OTHER): Payer: BC Managed Care – PPO | Admitting: Advanced Practice Midwife

## 2022-04-02 VITALS — BP 115/78 | HR 93 | Wt 168.0 lb

## 2022-04-02 DIAGNOSIS — I10 Essential (primary) hypertension: Secondary | ICD-10-CM

## 2022-04-02 DIAGNOSIS — O0992 Supervision of high risk pregnancy, unspecified, second trimester: Secondary | ICD-10-CM

## 2022-04-02 DIAGNOSIS — Z3A23 23 weeks gestation of pregnancy: Secondary | ICD-10-CM

## 2022-04-02 DIAGNOSIS — O10912 Unspecified pre-existing hypertension complicating pregnancy, second trimester: Secondary | ICD-10-CM

## 2022-04-02 DIAGNOSIS — O10919 Unspecified pre-existing hypertension complicating pregnancy, unspecified trimester: Secondary | ICD-10-CM

## 2022-04-02 DIAGNOSIS — Z3A19 19 weeks gestation of pregnancy: Secondary | ICD-10-CM

## 2022-04-02 NOTE — Progress Notes (Signed)
HIGH-RISK PREGNANCY VISIT Patient name: Allison Prince MRN [id number]  Date of birth: 02-13-1990 Chief Complaint:   Routine Prenatal Visit and Pregnancy Ultrasound  History of Present Illness:   Allison Prince is a 32 y.o. G24P1011 female at 13w6dwith an Estimated Date of Delivery: 07/24/22 being seen today for ongoing management of a high-risk pregnancy complicated by chronic hypertension currently on no meds- hasn't taken Labetalol these past few weeks; she was taking it qd, but then her SBP was in the 90s and so she stopped. Is getting values <120/80.   Today she reports no complaints. Contractions: Not present.  .  Movement: Present. denies leaking of fluid.      01/06/2022    3:32 PM 11/22/2020    2:36 PM 03/15/2018   10:38 AM 11/10/2017    3:14 PM 02/27/2017   11:09 AM  Depression screen PHQ 2/9  Decreased Interest 1 0 0 0 0  Down, Depressed, Hopeless 0 0 0 0 0  PHQ - 2 Score 1 0 0 0 0  Altered sleeping 2    1  Tired, decreased energy 2    1  Change in appetite 2    1  Feeling bad or failure about yourself  0    0  Trouble concentrating 0      Moving slowly or fidgety/restless 0    0  Suicidal thoughts 0    0  PHQ-9 Score 7    3  Difficult doing work/chores     Not difficult at all        01/06/2022    3:32 PM  GAD 7 : Generalized Anxiety Score  Nervous, Anxious, on Edge 0  Control/stop worrying 1  Worry too much - different things 1  Trouble relaxing 0  Restless 0  Easily annoyed or irritable 2  Afraid - awful might happen 0  Total GAD 7 Score 4     Review of Systems:   Pertinent items are noted in HPI Denies abnormal vaginal discharge w/ itching/odor/irritation, headaches, visual changes, shortness of breath, chest pain, abdominal pain, severe nausea/vomiting, or problems with urination or bowel movements unless otherwise stated above. Pertinent History Reviewed:  Reviewed past medical,surgical, social, obstetrical and family history.  Reviewed  problem list, medications and allergies. Physical Assessment:   Vitals:   04/02/22 1045  BP: 115/78  Pulse: 93  Weight: 168 lb (76.2 kg)  Body mass index is 30.73 kg/m.           Physical Examination:   General appearance: alert, well appearing, and in no distress  Mental status: alert, oriented to person, place, and time  Skin: warm & dry   Extremities: Edema: None    Cardiovascular: normal heart rate noted  Respiratory: normal respiratory effort, no distress  Abdomen: gravid, soft, non-tender  Pelvic: Cervical exam deferred         Fetal Status: Fetal Heart Rate (bpm): 138 u/s   Movement: Present    Fetal Surveillance Testing today: [place]23+6 wks,breech,posterior placenta gr 0,normal ovaries,CX 3.8 cm,SVP of fluid 5.3 cm,two small posterior fibroids N/C,FHR 138 bpm,EFW 686 g 64%     No results found for this or any previous visit (from the past 24 hour(s)).  Assessment & Plan:  High-risk pregnancy: G3P1011 at 28w6dith an Estimated Date of Delivery: 07/24/22   1) cHTN, has Lab '200mg'$  bid, but isn't requiring meds currently; she is checking her BP twice daily and will take it if she  gets values >130-80  2) May want BTL, will sign 30d papers at next visit possibly  Meds: No orders of the defined types were placed in this encounter.   Labs/procedures today: U/S  Treatment Plan:  continue growth q 4wks; begin 2x/wk testing @ 32 weeks if requiring meds  Reviewed: Preterm labor symptoms and general obstetric precautions including but not limited to vaginal bleeding, contractions, leaking of fluid and fetal movement were reviewed in detail with the patient.  All questions were answered. Does have home bp cuff. Office bp cuff given: not applicable. Check bp twice daily, let us know if consistently >140 and/or >90.  Follow-up: Return in about 4 weeks (around 04/30/2022) for HROB, PN2, [place]: EFW.   Future Appointments  Date Time Provider [place]  05/01/2022  8:30 AM  [facility]  05/01/2022 10:00 AM [facility] [place] [facility] None  05/01/2022 10:50 AM [place], Mertie Clause, MD [facility]    Orders Placed This Encounter  Procedures   US OB Follow Up   [facility] 04/02/2022 11:16 AM

## 2022-04-02 NOTE — Progress Notes (Signed)
[place] 23+6 wks,breech,posterior placenta gr 0,normal ovaries,CX 3.8 cm,SVP of fluid 5.3 cm,two small posterior fibroids N/C,FHR 138 bpm,EFW 686 g 64%

## 2022-04-02 NOTE — Patient Instructions (Signed)
Allison Prince, I greatly value your feedback.  If you receive a survey following your visit with [place] today, we appreciate you taking the time to fill it out.  Thanks, Derrill Memo, CNM   You will have your sugar test next visit.  Please do not eat or drink anything after midnight the night before you come, not even water.  You will be here for at least two hours.  Please make an appointment online for the bloodwork at [email or web address] for 8:30am (or as close to this as possible). Make sure you select the [facility] service center. The day of the appointment, check in with our office first, then you will go to [place] to start the sugar test.    [place]!!! It is now [place] at [facility] ([place], [place] [address]) Entrance C, located off of [address] parking  Go to [email or web address] to register for FREE online childbirth classes   Call the office [phone]) or go to [facility] if: You begin to have strong, frequent contractions Your water breaks.  Sometimes it is a big gush of fluid, sometimes it is just a trickle that keeps getting your panties wet or running down your legs You have vaginal bleeding.  It is normal to have a small amount of spotting if your cervix was checked.  You don't feel your baby moving like normal.  If you don't, get you something to eat and drink and lay down and focus on feeling your baby move.   If your baby is still not moving like normal, you should call the office or go to [place] [facility]/Family Doctors: [place] [phone]                [place] [phone] (usually not accepting new patients unless you have family there already, you are always welcome to call and ask)      [facility] [phone]       [facility] Pediatricians/Family Doctors:  [place]  [facility]: [phone] [facility]: [phone] [facility]: [place] Doctors:  [facility]: [place] [facility]: [place]: [place]: [phone]   Home Blood Pressure Monitoring for Patients   Your provider has recommended that you check your blood pressure (BP) at least once a week at home. If you do not have a blood pressure cuff at home, one will be provided for you. Contact your provider if you have not received your monitor within 1 week.   Helpful Tips for Accurate Home Blood Pressure Checks  Don't smoke, exercise, or drink caffeine 30 minutes before checking your BP Use the restroom before checking your BP (a full bladder can raise your pressure) Relax in a comfortable upright chair Feet on the ground Left arm resting comfortably on a flat surface at the level of your heart Legs uncrossed Back supported Sit quietly and don't talk Place the cuff on your bare arm Adjust snuggly, so that only two fingertips can fit between your skin and the top of the cuff Check 2 readings separated by at least one minute Keep a log of your BP readings For a visual, please reference this diagram: http://ccnc.care/bpdiagram  Provider Name: [facility]     Phone: [phone]  Zone 1: ALL CLEAR  Continue to monitor your symptoms:  BP reading is less than 140 (top number) or less than 90 (bottom number)  No right upper stomach pain No headaches or seeing spots No feeling nauseated or throwing up No swelling in face and hands  Zone 2: CAUTION Call your doctor's office for any of the following:  BP reading is greater than 140 (top number) or greater than 90 (bottom number)  Stomach pain under your ribs in the middle or right side Headaches or seeing spots Feeling nauseated or throwing up Swelling in face and hands  Zone 3: EMERGENCY   Seek immediate medical care if you have any of the following:  BP reading is greater than160 (top number) or greater than 110 (bottom number) Severe headaches not improving with Tylenol Serious difficulty catching your breath Any worsening symptoms from Zone 2   Second Trimester of Pregnancy The second trimester is from week 13 through week 28, months 4 through 6. The second trimester is often a time when you feel your best. Your body has also adjusted to being pregnant, and you begin to feel better physically. Usually, morning sickness has lessened or quit completely, you may have more energy, and you may have an increase in appetite. The second trimester is also a time when the fetus is growing rapidly. At the end of the sixth month, the fetus is about 9 inches long and weighs about 1 pounds. You will likely begin to feel the baby move (quickening) between 18 and 20 weeks of the pregnancy. BODY CHANGES Your body goes through many changes during pregnancy. The changes vary from woman to woman.  Your weight will continue to increase. You will notice your lower abdomen bulging out. You may begin to get stretch marks on your hips, abdomen, and breasts. You may develop headaches that can be relieved by medicines approved by your health care provider. You may urinate more often because the fetus is pressing on your bladder. You may develop or continue to have heartburn as a result of your pregnancy. You may develop constipation because certain hormones are causing the muscles that push waste through your intestines to slow down. You may develop hemorrhoids or swollen, bulging veins (varicose veins). You may have back pain because of the weight gain and pregnancy hormones relaxing your joints between the bones in your pelvis and as a result of a shift in weight and the muscles that support your balance. Your breasts will continue to grow and be tender. Your gums may bleed and may be sensitive to  brushing and flossing. Dark spots or blotches (chloasma, mask of pregnancy) may develop on your face. This will likely fade after the baby is born. A dark line from your belly button to the pubic area (linea nigra) may appear. This will likely fade after the baby is born. You may have changes in your hair. These can include thickening of your hair, rapid growth, and changes in texture. Some women also have hair loss during or after pregnancy, or hair that feels dry or thin. Your hair will most likely return to normal after your baby is born. WHAT TO EXPECT AT YOUR PRENATAL VISITS During a routine prenatal visit: You will be weighed to make sure you and the fetus are growing normally. Your blood pressure will be taken. Your abdomen will be measured to track your baby's growth. The fetal heartbeat will be listened to. Any test results from the previous visit will be discussed. Your health care provider  may ask you: How you are feeling. If you are feeling the baby move. If you have had any abnormal symptoms, such as leaking fluid, bleeding, severe headaches, or abdominal cramping. If you have any questions. Other tests that may be performed during your second trimester include: Blood tests that check for: Low iron levels (anemia). Gestational diabetes (between 24 and 28 weeks). Rh antibodies. Urine tests to check for infections, diabetes, or protein in the urine. An ultrasound to confirm the proper growth and development of the baby. An amniocentesis to check for possible genetic problems. Fetal screens for spina bifida and Down syndrome. HOME CARE INSTRUCTIONS  Avoid all smoking, herbs, alcohol, and unprescribed drugs. These chemicals affect the formation and growth of the baby. Follow your health care provider's instructions regarding medicine use. There are medicines that are either safe or unsafe to take during pregnancy. Exercise only as directed by your health care provider.  Experiencing uterine cramps is a good sign to stop exercising. Continue to eat regular, healthy meals. Wear a good support bra for breast tenderness. Do not use hot tubs, steam rooms, or saunas. Wear your seat belt at all times when driving. Avoid raw meat, uncooked cheese, cat litter boxes, and soil used by cats. These carry germs that can cause birth defects in the baby. Take your prenatal vitamins. Try taking a stool softener (if your health care provider approves) if you develop constipation. Eat more high-fiber foods, such as fresh vegetables or fruit and whole grains. Drink plenty of fluids to keep your urine clear or pale yellow. Take warm sitz baths to soothe any pain or discomfort caused by hemorrhoids. Use hemorrhoid cream if your health care provider approves. If you develop varicose veins, wear support hose. Elevate your feet for 15 minutes, 3-4 times a day. Limit salt in your diet. Avoid heavy lifting, wear low heel shoes, and practice good posture. Rest with your legs elevated if you have leg cramps or low back pain. Visit your dentist if you have not gone yet during your pregnancy. Use a soft toothbrush to brush your teeth and be gentle when you floss. A sexual relationship may be continued unless your health care provider directs you otherwise. Continue to go to all your prenatal visits as directed by your health care provider. SEEK MEDICAL CARE IF:  You have dizziness. You have mild pelvic cramps, pelvic pressure, or nagging pain in the abdominal area. You have persistent nausea, vomiting, or diarrhea. You have a bad smelling vaginal discharge. You have pain with urination. SEEK IMMEDIATE MEDICAL CARE IF:  You have a fever. You are leaking fluid from your vagina. You have spotting or bleeding from your vagina. You have severe abdominal cramping or pain. You have rapid weight gain or loss. You have shortness of breath with chest pain. You notice sudden or extreme swelling  of your face, hands, ankles, feet, or legs. You have not felt your baby move in over an hour. You have severe headaches that do not go away with medicine. You have vision changes. Document Released: 01/07/2001 Document Revised: 01/18/2013 Document Reviewed: 03/16/2012 [facility] Patient Information 2015 [place], [place]. This information is not intended to replace advice given to you by your health care provider. Make sure you discuss any questions you have with your health care provider.

## 2022-05-01 ENCOUNTER — Encounter: Payer: BC Managed Care – PPO | Admitting: Obstetrics & Gynecology

## 2022-05-01 ENCOUNTER — Other Ambulatory Visit: Payer: BC Managed Care – PPO

## 2022-05-07 ENCOUNTER — Ambulatory Visit (INDEPENDENT_AMBULATORY_CARE_PROVIDER_SITE_OTHER): Payer: BC Managed Care – PPO

## 2022-05-07 ENCOUNTER — Other Ambulatory Visit: Payer: BC Managed Care – PPO

## 2022-05-07 ENCOUNTER — Ambulatory Visit (INDEPENDENT_AMBULATORY_CARE_PROVIDER_SITE_OTHER): Payer: BC Managed Care – PPO | Admitting: Obstetrics & Gynecology

## 2022-05-07 VITALS — BP 116/72 | HR 87 | Wt 165.2 lb

## 2022-05-07 DIAGNOSIS — O0993 Supervision of high risk pregnancy, unspecified, third trimester: Secondary | ICD-10-CM

## 2022-05-07 DIAGNOSIS — O10913 Unspecified pre-existing hypertension complicating pregnancy, third trimester: Secondary | ICD-10-CM

## 2022-05-07 DIAGNOSIS — O10919 Unspecified pre-existing hypertension complicating pregnancy, unspecified trimester: Secondary | ICD-10-CM

## 2022-05-07 DIAGNOSIS — O0992 Supervision of high risk pregnancy, unspecified, second trimester: Secondary | ICD-10-CM

## 2022-05-07 DIAGNOSIS — Z131 Encounter for screening for diabetes mellitus: Secondary | ICD-10-CM

## 2022-05-07 DIAGNOSIS — Z3A28 28 weeks gestation of pregnancy: Secondary | ICD-10-CM

## 2022-05-07 LAB — POCT URINALYSIS DIPSTICK OB
Blood, UA: NEGATIVE
Glucose, UA: NEGATIVE
Ketones, UA: NEGATIVE
Leukocytes, UA: NEGATIVE
Nitrite, UA: NEGATIVE

## 2022-05-07 NOTE — Progress Notes (Signed)
[place] 28+6 wks,breech,posterior placenta gr 0,normal ovaries,cx 4 cm,AFI 16 cm,FHR 132 bpm,EFW 1274 g 33%,BPD 2.8%

## 2022-05-07 NOTE — Progress Notes (Signed)
HIGH-RISK PREGNANCY VISIT Patient name: [name] MRN [id number]  Date of birth: [birth year] Chief Complaint:   Routine Prenatal Visit  History of Present Illness:   Allison Prince is a 32 y.o. G45P1011 female at [redacted]w[redacted]d with an Estimated Date of Delivery: 07/24/22 being seen today for ongoing management of a high-risk pregnancy complicated by:  -Chronic HTN- due to hypotensive episodes currently not taking medication; however supposed to be on Labetalol 200mg  bid  -Uterine fibroids- best seen on early [place]- multiple fibroids (#1) posterior subserosal fibroid 2.3 x 1.2 x 1.9 cm,(#2) posterior fundal subserosal fibroid 3 x 2.7 x 2.9 cm   Today she reports no complaints.   Contractions: Not present. Vag. Bleeding: None.  Movement: Present. denies leaking of fluid.      01/06/2022    3:32 PM 11/22/2020    2:36 PM 03/15/2018   10:38 AM 11/10/2017    3:14 PM 02/27/2017   11:09 AM  Depression screen PHQ 2/9  Decreased Interest 1 0 0 0 0  Down, Depressed, Hopeless 0 0 0 0 0  PHQ - 2 Score 1 0 0 0 0  Altered sleeping 2    1  Tired, decreased energy 2    1  Change in appetite 2    1  Feeling bad or failure about yourself  0    0  Trouble concentrating 0      Moving slowly or fidgety/restless 0    0  Suicidal thoughts 0    0  PHQ-9 Score 7    3  Difficult doing work/chores     Not difficult at all     Current Outpatient Medications  Medication Instructions   labetalol (NORMODYNE) 200 mg, Oral, 2 times daily   Prenatal 27-1 MG TABS 1 tablet, Oral, Daily at bedtime     Review of Systems:   Pertinent items are noted in HPI Denies abnormal vaginal discharge w/ itching/odor/irritation, headaches, visual changes, shortness of breath, chest pain, abdominal pain, severe nausea/vomiting, or problems with urination or bowel movements unless otherwise stated above. Pertinent History Reviewed:  Reviewed past medical,surgical, social, obstetrical and family history.  Reviewed problem  list, medications and allergies. Physical Assessment:   Vitals:   05/07/22 0949  BP: 116/72  Pulse: 87  Weight: 165 lb 3.2 oz (74.9 kg)  Body mass index is 30.22 kg/m.           Physical Examination:   General appearance: alert, well appearing, and in no distress  Mental status: normal mood, behavior, speech, dress, motor activity, and thought processes  Skin: warm & dry   Extremities:      Cardiovascular: normal heart rate noted  Respiratory: normal respiratory effort, no distress  Abdomen: gravid, soft, non-tender  Pelvic: Cervical exam deferred         Fetal Status:     Movement: Present    Fetal Surveillance Testing today: breech,posterior placenta gr 0,normal ovaries,cx 4 cm,AFI 16 cm,FHR 132 bpm,EFW 1274 g 33%,BPD 2.8%    Chaperone: N/A    Results for orders placed or performed in visit on 05/07/22 (from the past 24 hour(s))  POC Urinalysis Dipstick OB   Collection Time: 05/07/22  9:55 AM  Result Value Ref Range   Color, UA     Clarity, UA     Glucose, UA Negative Negative   Bilirubin, UA     Ketones, UA neg    Spec Grav, UA     Blood, UA neg  pH, UA     POC,PROTEIN,UA Trace Negative, Trace, Small (1+), Moderate (2+), Large (3+), 4+   Urobilinogen, UA     Nitrite, UA neg    Leukocytes, UA Negative Negative   Appearance     Odor       Assessment & Plan:  High-risk pregnancy: G3P1011 at [redacted]w[redacted]d with an Estimated Date of Delivery: 07/24/22   1) Chronic HTN -plan to continue growth scan q 4wks -discussed concern that over the next few weeks she may need to restart BP medication -Plan to start antepartum testing @ 32wks  2) Uterine fibroids -asymptomatic -<3cm subserosal  3) Contraceptive management -considering salpingectomy or Nexplanon -Benefit and Risks of salpingectomy discussed with patient including but not limited to: risk of regret, permanence of method, bleeding, infection, and potential injury to surrounding organs.  Discussed that especially in  setting of salpingectomy, risk of ectopic pregnancy low, less than 1%.  Also discussed possibility of post-tubal pain syndrome. Patient verbalized understanding of these risks and wishes to consider.  Inform consent obtained   Meds: No orders of the defined types were placed in this encounter.   Labs/procedures today: PN2, growth scan  Treatment Plan:  as outlined above  Reviewed: Preterm labor symptoms and general obstetric precautions including but not limited to vaginal bleeding, contractions, leaking of fluid and fetal movement were reviewed in detail with the patient.  All questions were answered. Pt has home bp cuff. Check bp weekly, let us know if >140/90.   Follow-up: Return in about 2 weeks (around 05/21/2022) for HROB visit and growth every 4wks and in 4wks (@ 32wk)- weekly BPP.   Future Appointments  Date Time Provider Department Center  05/21/2022 10:10 AM Hermina Staggers, MD [facility]    Orders Placed This Encounter  Procedures   US OB Follow Up   US FETAL BPP WO NON STRESS   POC Urinalysis Dipstick OB    Myna Hidalgo, DO Attending Obstetrician & Gynecologist, [facility] [employer], [facility]

## 2022-05-08 LAB — ANTIBODY SCREEN: Antibody Screen: NEGATIVE

## 2022-05-08 LAB — GLUCOSE TOLERANCE, 2 HOURS W/ 1HR
Glucose, 1 hour: 156 mg/dL (ref 70–179)
Glucose, 2 hour: 135 mg/dL (ref 70–152)
Glucose, Fasting: 85 mg/dL (ref 70–91)

## 2022-05-08 LAB — CBC
Hematocrit: 35.4 % (ref 34.0–46.6)
Hemoglobin: 11.5 g/dL (ref 11.1–15.9)
MCH: 28.9 pg (ref 26.6–33.0)
MCHC: 32.5 g/dL (ref 31.5–35.7)
MCV: 89 fL (ref 79–97)
Platelets: 398 10*3/uL (ref 150–450)
RBC: 3.98 x10E6/uL (ref 3.77–5.28)
RDW: 12.8 % (ref 11.7–15.4)
WBC: 8.2 10*3/uL (ref 3.4–10.8)

## 2022-05-08 LAB — HIV ANTIBODY (ROUTINE TESTING W REFLEX): HIV Screen 4th Generation wRfx: NONREACTIVE

## 2022-05-08 LAB — RPR: RPR Ser Ql: NONREACTIVE

## 2022-05-21 ENCOUNTER — Ambulatory Visit (INDEPENDENT_AMBULATORY_CARE_PROVIDER_SITE_OTHER): Payer: BC Managed Care – PPO | Admitting: Obstetrics and Gynecology

## 2022-05-21 ENCOUNTER — Encounter: Payer: Self-pay | Admitting: Obstetrics and Gynecology

## 2022-05-21 VITALS — BP 113/72 | HR 100 | Wt 164.0 lb

## 2022-05-21 DIAGNOSIS — Z23 Encounter for immunization: Secondary | ICD-10-CM | POA: Diagnosis not present

## 2022-05-21 DIAGNOSIS — D259 Leiomyoma of uterus, unspecified: Secondary | ICD-10-CM

## 2022-05-21 DIAGNOSIS — I159 Secondary hypertension, unspecified: Secondary | ICD-10-CM

## 2022-05-21 DIAGNOSIS — Z3A30 30 weeks gestation of pregnancy: Secondary | ICD-10-CM

## 2022-05-21 DIAGNOSIS — O10913 Unspecified pre-existing hypertension complicating pregnancy, third trimester: Secondary | ICD-10-CM

## 2022-05-21 DIAGNOSIS — O10919 Unspecified pre-existing hypertension complicating pregnancy, unspecified trimester: Secondary | ICD-10-CM

## 2022-05-21 DIAGNOSIS — Z3009 Encounter for other general counseling and advice on contraception: Secondary | ICD-10-CM | POA: Insufficient documentation

## 2022-05-21 DIAGNOSIS — O0993 Supervision of high risk pregnancy, unspecified, third trimester: Secondary | ICD-10-CM

## 2022-05-21 LAB — POCT URINALYSIS DIPSTICK OB
Blood, UA: NEGATIVE
Glucose, UA: NEGATIVE
Ketones, UA: NEGATIVE
Leukocytes, UA: NEGATIVE
Nitrite, UA: NEGATIVE
POC,PROTEIN,UA: NEGATIVE

## 2022-05-21 NOTE — Progress Notes (Signed)
Subjective:  Allison Prince is a 32 y.o. G3P1011 at [redacted]w[redacted]d being seen today for ongoing prenatal care.  She is currently monitored for the following issues for this high-risk pregnancy and has Smoker; Uterine fibroids; Hypertension; Supervision of high-risk pregnancy; and Unwanted fertility on their problem list.  Patient reports  general discomforts of pregnancy .  Contractions: Not present. Vag. Bleeding: None.  Movement: Present. Denies leaking of fluid.   The following portions of the patient's history were reviewed and updated as appropriate: allergies, current medications, past family history, past medical history, past social history, past surgical history and problem list. Problem list updated.  Objective:   Vitals:   05/21/22 1005  BP: 113/72  Pulse: 100  Weight: 164 lb (74.4 kg)    Fetal Status:     Movement: Present     General:  Alert, oriented and cooperative. Patient is in no acute distress.  Skin: Skin is warm and dry. No rash noted.   Cardiovascular: Normal heart rate noted  Respiratory: Normal respiratory effort, no problems with respiration noted  Abdomen: Soft, gravid, appropriate for gestational age. Pain/Pressure: Absent     Pelvic:  Cervical exam deferred        Extremities: Normal range of motion.  Edema: None  Mental Status: Normal mood and affect. Normal behavior. Normal judgment and thought content.   Urinalysis:      Assessment and Plan:  Pregnancy: G3P1011 at [redacted]w[redacted]d  1. [redacted] weeks gestation of pregnancy  - POC Urinalysis Dipstick OB  2. Chronic hypertension affecting pregnancy BP stable without meds Serial growth and antenatal testing as per protcol - POC Urinalysis Dipstick OB  3. Supervision of high risk pregnancy in third trimester Stable  4. Secondary hypertension See above  5. Uterine leiomyoma, unspecified location Stable  6. Unwanted fertility BTL papers signed  Preterm labor symptoms and general obstetric precautions including  but not limited to vaginal bleeding, contractions, leaking of fluid and fetal movement were reviewed in detail with the patient. Please refer to After Visit Summary for other counseling recommendations.  Return in about 2 weeks (around 06/04/2022) for OB visit, face to face, any provider.   Hermina Staggers, MD

## 2022-05-29 ENCOUNTER — Ambulatory Visit (INDEPENDENT_AMBULATORY_CARE_PROVIDER_SITE_OTHER): Payer: BC Managed Care – PPO

## 2022-05-29 ENCOUNTER — Ambulatory Visit (INDEPENDENT_AMBULATORY_CARE_PROVIDER_SITE_OTHER): Payer: BC Managed Care – PPO | Admitting: Obstetrics & Gynecology

## 2022-05-29 ENCOUNTER — Other Ambulatory Visit: Payer: Self-pay | Admitting: Obstetrics & Gynecology

## 2022-05-29 VITALS — BP 106/72 | HR 90 | Wt 163.0 lb

## 2022-05-29 DIAGNOSIS — O0992 Supervision of high risk pregnancy, unspecified, second trimester: Secondary | ICD-10-CM

## 2022-05-29 DIAGNOSIS — O0993 Supervision of high risk pregnancy, unspecified, third trimester: Secondary | ICD-10-CM

## 2022-05-29 DIAGNOSIS — O10919 Unspecified pre-existing hypertension complicating pregnancy, unspecified trimester: Secondary | ICD-10-CM

## 2022-05-29 DIAGNOSIS — O10913 Unspecified pre-existing hypertension complicating pregnancy, third trimester: Secondary | ICD-10-CM

## 2022-05-29 DIAGNOSIS — Z3A32 32 weeks gestation of pregnancy: Secondary | ICD-10-CM

## 2022-05-29 LAB — POCT URINALYSIS DIPSTICK OB
Blood, UA: NEGATIVE
Glucose, UA: NEGATIVE
Ketones, UA: NEGATIVE
Leukocytes, UA: NEGATIVE
Nitrite, UA: NEGATIVE
POC,PROTEIN,UA: NEGATIVE

## 2022-05-29 NOTE — Progress Notes (Signed)
HIGH-RISK PREGNANCY VISIT Patient name: Allison Prince MRN [id number]  Date of birth: [birth year] Chief Complaint:   Routine Prenatal Visit  History of Present Illness:   Allison Prince is a 32 y.o. G65P1011 female at [redacted]w[redacted]d with an Estimated Date of Delivery: 07/24/22 being seen today for ongoing management of a high-risk pregnancy complicated by:  Chronic HTN-taking labetalol as needed.  Denies any recent usage.  Patient asymptomatic   Today she reports no complaints.   Contractions: Not present. Vag. Bleeding: None.  Movement: Present. denies leaking of fluid.      01/06/2022    3:32 PM 11/22/2020    2:36 PM 03/15/2018   10:38 AM 11/10/2017    3:14 PM 02/27/2017   11:09 AM  Depression screen PHQ 2/9  Decreased Interest 1 0 0 0 0  Down, Depressed, Hopeless 0 0 0 0 0  PHQ - 2 Score 1 0 0 0 0  Altered sleeping 2    1  Tired, decreased energy 2    1  Change in appetite 2    1  Feeling bad or failure about yourself  0    0  Trouble concentrating 0      Moving slowly or fidgety/restless 0    0  Suicidal thoughts 0    0  PHQ-9 Score 7    3  Difficult doing work/chores     Not difficult at all     Current Outpatient Medications  Medication Instructions   labetalol (NORMODYNE) 200 mg, Oral, 2 times daily   Prenatal 27-1 MG TABS 1 tablet, Oral, Daily at bedtime     Review of Systems:   Pertinent items are noted in HPI Denies abnormal vaginal discharge w/ itching/odor/irritation, headaches, visual changes, shortness of breath, chest pain, abdominal pain, severe nausea/vomiting, or problems with urination or bowel movements unless otherwise stated above. Pertinent History Reviewed:  Reviewed past medical,surgical, social, obstetrical and family history.  Reviewed problem list, medications and allergies. Physical Assessment:   Vitals:   05/29/22 1210  BP: 106/72  Pulse: 90  Weight: 163 lb (73.9 kg)  Body mass index is 29.81 kg/m.           Physical Examination:    General appearance: alert, well appearing, and in no distress  Mental status: normal mood, behavior, speech, dress, motor activity, and thought processes  Skin: warm & dry   Extremities:      Cardiovascular: normal heart rate noted  Respiratory: normal respiratory effort, no distress  Abdomen: gravid, soft, non-tender  Pelvic: Cervical exam deferred         Fetal Status:     Movement: Present    Fetal Surveillance Testing today: cephalic,BPP 8/8,FHR 140 bpm,posterior placenta gr 0,AFI 17 cm,2 x 1.8 x 1.2 cm anterior fibroid,EFW 1803 g 28%,RI .64,.63,.66=54%    Chaperone: N/A    Results for orders placed or performed in visit on 05/29/22 (from the past 24 hour(s))  POC Urinalysis Dipstick OB   Collection Time: 05/29/22 12:15 PM  Result Value Ref Range   Color, UA     Clarity, UA     Glucose, UA Negative Negative   Bilirubin, UA     Ketones, UA neg    Spec Grav, UA     Blood, UA neg    pH, UA     POC,PROTEIN,UA Negative Negative, Trace, Small (1+), Moderate (2+), Large (3+), 4+   Urobilinogen, UA     Nitrite, UA neg  Leukocytes, UA Negative Negative   Appearance     Odor       Assessment & Plan:  High-risk pregnancy: G3P1011 at [redacted]w[redacted]d with an Estimated Date of Delivery: 07/24/22   1) Chronic HTN   -BPP 8/8, continue weekly testing -will continue to monitor BPs -discussed IOL 38-39wk pending BP  2) Uterine fibroids  Meds: No orders of the defined types were placed in this encounter.   Labs/procedures today: BPP  Treatment Plan:  as outlined above  Reviewed: Preterm labor symptoms and general obstetric precautions including but not limited to vaginal bleeding, contractions, leaking of fluid and fetal movement were reviewed in detail with the patient.  All questions were answered. Pt has home bp cuff. Check bp weekly, let us know if >140/90.   Follow-up: Return in about 1 week (around 06/05/2022) for HROB visit/BPP weekly.   Future Appointments  Date Time  Provider Department Center  06/05/2022  9:15 AM [facility] [place] [facility] None  06/05/2022 10:10 AM Lazaro Arms, MD [facility]  06/12/2022  3:00 PM [facility] [place] [facility] None  06/12/2022  4:10 PM Myna Hidalgo, DO [facility]  06/19/2022 11:30 AM [facility] [place] [facility] None  06/19/2022  1:30 PM Lazaro Arms, MD [facility]  06/26/2022  9:15 AM [facility] [place] [facility] None  06/26/2022 10:10 AM Myna Hidalgo, DO [facility]  07/03/2022  9:15 AM [facility] [place] [facility] None  07/03/2022 10:10 AM Myna Hidalgo, DO [facility]  07/10/2022  9:10 AM [facility]  07/10/2022  9:30 AM Jacklyn Shell, CNM [facility]  07/10/2022 10:10 AM Lazaro Arms, MD [facility]  07/17/2022 10:45 AM [facility] [place] [facility] None  07/17/2022 11:30 AM Myna Hidalgo, DO [facility]    Orders Placed This Encounter  Procedures   POC Urinalysis Dipstick OB    Myna Hidalgo, DO Attending Obstetrician & Gynecologist, [facility] [employer], [facility]

## 2022-05-29 NOTE — Progress Notes (Signed)
[place] 32 wks,cephalic,BPP 8/8,FHR 140 bpm,posterior placenta gr 0,AFI 17 cm,2 x 1.8 x 1.2 cm anterior fibroid,EFW 1803 g 28%,RI .64,.63,.66=54%

## 2022-06-04 ENCOUNTER — Other Ambulatory Visit: Payer: Self-pay | Admitting: Obstetrics & Gynecology

## 2022-06-04 DIAGNOSIS — O10919 Unspecified pre-existing hypertension complicating pregnancy, unspecified trimester: Secondary | ICD-10-CM

## 2022-06-04 DIAGNOSIS — O0992 Supervision of high risk pregnancy, unspecified, second trimester: Secondary | ICD-10-CM

## 2022-06-05 ENCOUNTER — Ambulatory Visit (INDEPENDENT_AMBULATORY_CARE_PROVIDER_SITE_OTHER): Payer: BC Managed Care – PPO | Admitting: Obstetrics & Gynecology

## 2022-06-05 ENCOUNTER — Encounter: Payer: Self-pay | Admitting: Obstetrics & Gynecology

## 2022-06-05 ENCOUNTER — Ambulatory Visit (INDEPENDENT_AMBULATORY_CARE_PROVIDER_SITE_OTHER): Payer: BC Managed Care – PPO

## 2022-06-05 ENCOUNTER — Encounter: Payer: BC Managed Care – PPO | Admitting: Obstetrics & Gynecology

## 2022-06-05 VITALS — BP 118/77 | HR 92 | Wt 167.0 lb

## 2022-06-05 DIAGNOSIS — O0992 Supervision of high risk pregnancy, unspecified, second trimester: Secondary | ICD-10-CM

## 2022-06-05 DIAGNOSIS — O0993 Supervision of high risk pregnancy, unspecified, third trimester: Secondary | ICD-10-CM

## 2022-06-05 DIAGNOSIS — Z3A33 33 weeks gestation of pregnancy: Secondary | ICD-10-CM

## 2022-06-05 DIAGNOSIS — O10913 Unspecified pre-existing hypertension complicating pregnancy, third trimester: Secondary | ICD-10-CM

## 2022-06-05 DIAGNOSIS — O10919 Unspecified pre-existing hypertension complicating pregnancy, unspecified trimester: Secondary | ICD-10-CM

## 2022-06-05 NOTE — Progress Notes (Signed)
[place] 33 wks,cephalic,BPP 8/8,FHR 164 bpm,posterior placenta gr 1,AFI 18 cm,RI .65,.65,.60=65%

## 2022-06-05 NOTE — Progress Notes (Signed)
HIGH-RISK PREGNANCY VISIT Patient name: Allison Prince MRN [id number]  Date of birth: September 20, 1990 Chief Complaint:   Routine Prenatal Visit  History of Present Illness:   Allison Prince is a 32 y.o. G34P1011 female at [redacted]w[redacted]d with an Estimated Date of Delivery: 07/24/22 being seen today for ongoing management of a high-risk pregnancy complicated by [facility] on prn labetalol.    Today she reports no complaints. Contractions: Not present. Vag. Bleeding: None.  Movement: Present. denies leaking of fluid.      01/06/2022    3:32 PM 11/22/2020    2:36 PM 03/15/2018   10:38 AM 11/10/2017    3:14 PM 02/27/2017   11:09 AM  Depression screen PHQ 2/9  Decreased Interest 1 0 0 0 0  Down, Depressed, Hopeless 0 0 0 0 0  PHQ - 2 Score 1 0 0 0 0  Altered sleeping 2    1  Tired, decreased energy 2    1  Change in appetite 2    1  Feeling bad or failure about yourself  0    0  Trouble concentrating 0      Moving slowly or fidgety/restless 0    0  Suicidal thoughts 0    0  PHQ-9 Score 7    3  Difficult doing work/chores     Not difficult at all        01/06/2022    3:32 PM  GAD 7 : Generalized Anxiety Score  Nervous, Anxious, on Edge 0  Control/stop worrying 1  Worry too much - different things 1  Trouble relaxing 0  Restless 0  Easily annoyed or irritable 2  Afraid - awful might happen 0  Total GAD 7 Score 4     Review of Systems:   Pertinent items are noted in HPI Denies abnormal vaginal discharge w/ itching/odor/irritation, headaches, visual changes, shortness of breath, chest pain, abdominal pain, severe nausea/vomiting, or problems with urination or bowel movements unless otherwise stated above. Pertinent History Reviewed:  Reviewed past medical,surgical, social, obstetrical and family history.  Reviewed problem list, medications and allergies. Physical Assessment:   Vitals:   06/05/22 0945  BP: 118/77  Pulse: 92  Weight: 167 lb (75.8 kg)  Body mass index is 30.54  kg/m.           Physical Examination:   General appearance: alert, well appearing, and in no distress  Mental status: alert, oriented to person, place, and time  Skin: warm & dry   Extremities: Edema: None    Cardiovascular: normal heart rate noted  Respiratory: normal respiratory effort, no distress  Abdomen: gravid, soft, non-tender  Pelvic: Cervical exam deferred         Fetal Status:     Movement: Present    Fetal Surveillance Testing today: BPP 8/8 UAD 65%   Chaperone: N/A    No results found for this or any previous visit (from the past 24 hour(s)).  Assessment & Plan:  High-risk pregnancy: G3P1011 at [redacted]w[redacted]d with an Estimated Date of Delivery: 07/24/22      ICD-10-CM   1. Supervision of high risk pregnancy in third trimester  O09.93     2. Chronic hypertension affecting pregnancy: on prn labetalol  O10.919         Meds: No orders of the defined types were placed in this encounter.   Orders: No orders of the defined types were placed in this encounter.    Labs/procedures today: U/S  Treatment  Plan:  twice weekly surveillance  Reviewed: Preterm labor symptoms and general obstetric precautions including but not limited to vaginal bleeding, contractions, leaking of fluid and fetal movement were reviewed in detail with the patient.  All questions were answered. Does have home bp cuff. Office bp cuff given: not applicable. Check bp twice daily, let us know if consistently >140 and/or >90.  Follow-up: No follow-ups on file.   Future Appointments  Date Time Provider Department Center  06/05/2022 10:10 AM Lazaro Arms, MD [facility]  06/12/2022  3:00 PM [facility] [place] [facility] None  06/12/2022  4:10 PM Myna Hidalgo, DO [facility]  06/19/2022 11:30 AM [facility] [place] [facility] None  06/19/2022  1:30 PM Lazaro Arms, MD [facility]  06/26/2022  9:15 AM [facility] [place] [facility] None  06/26/2022 10:10 AM Myna Hidalgo, DO [facility]  07/03/2022  9:15  AM [facility] [place] [facility] None  07/03/2022 10:10 AM Myna Hidalgo, DO [facility]  07/10/2022  9:10 AM [facility] N[facility]SE [facility]  07/10/2022  9:30 AM Jacklyn Shell, CNM [facility]  07/10/2022 10:10 AM Lazaro Arms, MD [facility]  07/17/2022 10:45 AM [facility] [place] [facility] None  07/17/2022 11:30 AM Myna Hidalgo, DO [facility]    No orders of the defined types were placed in this encounter.  Lazaro Arms  Attending Physician for the Center for [facility] 06/05/2022 9:57 AM

## 2022-06-11 ENCOUNTER — Other Ambulatory Visit: Payer: Self-pay | Admitting: Obstetrics & Gynecology

## 2022-06-11 DIAGNOSIS — O0992 Supervision of high risk pregnancy, unspecified, second trimester: Secondary | ICD-10-CM

## 2022-06-11 DIAGNOSIS — O10919 Unspecified pre-existing hypertension complicating pregnancy, unspecified trimester: Secondary | ICD-10-CM

## 2022-06-12 ENCOUNTER — Encounter: Payer: Self-pay | Admitting: Obstetrics & Gynecology

## 2022-06-12 ENCOUNTER — Ambulatory Visit (INDEPENDENT_AMBULATORY_CARE_PROVIDER_SITE_OTHER): Payer: BC Managed Care – PPO

## 2022-06-12 ENCOUNTER — Ambulatory Visit (INDEPENDENT_AMBULATORY_CARE_PROVIDER_SITE_OTHER): Payer: BC Managed Care – PPO | Admitting: Obstetrics & Gynecology

## 2022-06-12 VITALS — BP 119/77 | HR 95 | Wt 163.4 lb

## 2022-06-12 DIAGNOSIS — Z3A34 34 weeks gestation of pregnancy: Secondary | ICD-10-CM

## 2022-06-12 DIAGNOSIS — O0992 Supervision of high risk pregnancy, unspecified, second trimester: Secondary | ICD-10-CM

## 2022-06-12 DIAGNOSIS — O10913 Unspecified pre-existing hypertension complicating pregnancy, third trimester: Secondary | ICD-10-CM

## 2022-06-12 DIAGNOSIS — O0993 Supervision of high risk pregnancy, unspecified, third trimester: Secondary | ICD-10-CM

## 2022-06-12 DIAGNOSIS — O10919 Unspecified pre-existing hypertension complicating pregnancy, unspecified trimester: Secondary | ICD-10-CM

## 2022-06-12 NOTE — Progress Notes (Signed)
HIGH-RISK PREGNANCY VISIT Patient name: Allison Prince MRN [id number]  Date of birth: 1990-05-18 Chief Complaint:   Routine Prenatal Visit  History of Present Illness:   Allison Prince is a 32 y.o. G43P1011 female at [redacted]w[redacted]d with an Estimated Date of Delivery: 07/24/22 being seen today for ongoing management of a high-risk pregnancy complicated by:  -cHTN- on Labetalol prm -tobacco use  Today she reports no complaints.   Contractions: Not present. Vag. Bleeding: None.  Movement: Present. denies leaking of fluid.      01/06/2022    3:32 PM 11/22/2020    2:36 PM 03/15/2018   10:38 AM 11/10/2017    3:14 PM 02/27/2017   11:09 AM  Depression screen PHQ 2/9  Decreased Interest 1 0 0 0 0  Down, Depressed, Hopeless 0 0 0 0 0  PHQ - 2 Score 1 0 0 0 0  Altered sleeping 2    1  Tired, decreased energy 2    1  Change in appetite 2    1  Feeling bad or failure about yourself  0    0  Trouble concentrating 0      Moving slowly or fidgety/restless 0    0  Suicidal thoughts 0    0  PHQ-9 Score 7    3  Difficult doing work/chores     Not difficult at all     Current Outpatient Medications  Medication Instructions   labetalol (NORMODYNE) 200 mg, Oral, 2 times daily   Prenatal 27-1 MG TABS 1 tablet, Oral, Daily at bedtime     Review of Systems:   Pertinent items are noted in HPI Denies abnormal vaginal discharge w/ itching/odor/irritation, headaches, visual changes, shortness of breath, chest pain, abdominal pain, severe nausea/vomiting, or problems with urination or bowel movements unless otherwise stated above. Pertinent History Reviewed:  Reviewed past medical,surgical, social, obstetrical and family history.  Reviewed problem list, medications and allergies. Physical Assessment:   Vitals:   06/12/22 1543  BP: 119/77  Pulse: 95  Weight: 163 lb 6.4 oz (74.1 kg)  Body mass index is 29.89 kg/m.           Physical Examination:   General appearance: alert, well appearing,  and in no distress  Mental status: normal mood, behavior, speech, dress, motor activity, and thought processes  Skin: warm & dry   Extremities: Edema: Trace    Cardiovascular: normal heart rate noted  Respiratory: normal respiratory effort, no distress  Abdomen: gravid, soft, non-tender  Pelvic: Cervical exam deferred         Fetal Status:     Movement: Present    Fetal Surveillance Testing today: cephalic,posterior placenta gr 2,FHR 148 bpm,AFI 17 cm,RI .68,.68,.72=88%,BPP 8/8    Chaperone: N/A    No results found for this or any previous visit (from the past 24 hour(s)).   Assessment & Plan:  High-risk pregnancy: G3P1011 at [redacted]w[redacted]d with an Estimated Date of Delivery: 07/24/22   1) Chronic HTN- no meds, has Labetalol if needed -continue growth q 4wks -BPP 8/8, continue antepartum testing -discussed if BP remains stable off medication, plan for delivery 38-39wks   Meds: No orders of the defined types were placed in this encounter.   Labs/procedures today: BPP  Treatment Plan:  as outlined above  Reviewed: Preterm labor symptoms and general obstetric precautions including but not limited to vaginal bleeding, contractions, leaking of fluid and fetal movement were reviewed in detail with the patient.  All questions were answered.  Pt has home bp cuff. Check bp weekly, let us know if >140/90.   Follow-up: Return for twice weekly as scheduled.   Future Appointments  Date Time Provider Department Center  06/12/2022  4:10 PM Myna Hidalgo, DO [facility]  06/19/2022 11:30 AM [facility] [place] [facility] None  06/19/2022  1:30 PM Lazaro Arms, MD [facility]  06/26/2022  9:15 AM [facility] [place] [facility] None  06/26/2022 10:10 AM Myna Hidalgo, DO [facility]  07/03/2022  9:15 AM [facility] [place] [facility] None  07/03/2022 10:10 AM Myna Hidalgo, DO [facility]  07/10/2022  9:10 AM [facility] N[facility]  07/10/2022  9:30 AM Jacklyn Shell, CNM [facility]  07/10/2022 10:10 AM Lazaro Arms, MD [facility]  07/17/2022 10:45 AM [facility] [place] [facility] None  07/17/2022 11:30 AM Myna Hidalgo, DO [facility]    No orders of the defined types were placed in this encounter.   Myna Hidalgo, DO Attending Obstetrician & Gynecologist, [facility] for [employer], [facility]

## 2022-06-12 NOTE — Progress Notes (Signed)
[place] 34 wks,cephalic,posterior placenta gr 2,FHR 148 bpm,AFI 17 cm,RI .68,.68,.72=88%,BPP 8/8

## 2022-06-18 ENCOUNTER — Other Ambulatory Visit: Payer: Self-pay | Admitting: Obstetrics & Gynecology

## 2022-06-18 DIAGNOSIS — O0992 Supervision of high risk pregnancy, unspecified, second trimester: Secondary | ICD-10-CM

## 2022-06-18 DIAGNOSIS — O10919 Unspecified pre-existing hypertension complicating pregnancy, unspecified trimester: Secondary | ICD-10-CM

## 2022-06-19 ENCOUNTER — Ambulatory Visit (INDEPENDENT_AMBULATORY_CARE_PROVIDER_SITE_OTHER): Payer: BC Managed Care – PPO

## 2022-06-19 ENCOUNTER — Ambulatory Visit (INDEPENDENT_AMBULATORY_CARE_PROVIDER_SITE_OTHER): Payer: BC Managed Care – PPO | Admitting: Obstetrics & Gynecology

## 2022-06-19 ENCOUNTER — Encounter: Payer: Self-pay | Admitting: Obstetrics & Gynecology

## 2022-06-19 VITALS — BP 120/82 | HR 90 | Wt 165.0 lb

## 2022-06-19 DIAGNOSIS — Z3A35 35 weeks gestation of pregnancy: Secondary | ICD-10-CM

## 2022-06-19 DIAGNOSIS — O10913 Unspecified pre-existing hypertension complicating pregnancy, third trimester: Secondary | ICD-10-CM

## 2022-06-19 DIAGNOSIS — O10919 Unspecified pre-existing hypertension complicating pregnancy, unspecified trimester: Secondary | ICD-10-CM

## 2022-06-19 DIAGNOSIS — O0993 Supervision of high risk pregnancy, unspecified, third trimester: Secondary | ICD-10-CM | POA: Diagnosis not present

## 2022-06-19 DIAGNOSIS — O0992 Supervision of high risk pregnancy, unspecified, second trimester: Secondary | ICD-10-CM

## 2022-06-19 NOTE — Progress Notes (Signed)
HIGH-RISK PREGNANCY VISIT Patient name: CARLEIGH BERNARDS MRN [id number]  Date of birth: 10-09-1990 Chief Complaint:   Routine Prenatal Visit  History of Present Illness:   Allison Prince is a 32 y.o. G83P1011 female at [redacted]w[redacted]d with an Estimated Date of Delivery: 07/24/22 being seen today for ongoing management of a high-risk pregnancy complicated by [facility] no meds.    Today she reports no complaints. Contractions: Not present. Vag. Bleeding: None.  Movement: Present. denies leaking of fluid.      01/06/2022    3:32 PM 11/22/2020    2:36 PM 03/15/2018   10:38 AM 11/10/2017    3:14 PM 02/27/2017   11:09 AM  Depression screen PHQ 2/9  Decreased Interest 1 0 0 0 0  Down, Depressed, Hopeless 0 0 0 0 0  PHQ - 2 Score 1 0 0 0 0  Altered sleeping 2    1  Tired, decreased energy 2    1  Change in appetite 2    1  Feeling bad or failure about yourself  0    0  Trouble concentrating 0      Moving slowly or fidgety/restless 0    0  Suicidal thoughts 0    0  PHQ-9 Score 7    3  Difficult doing work/chores     Not difficult at all        01/06/2022    3:32 PM  GAD 7 : Generalized Anxiety Score  Nervous, Anxious, on Edge 0  Control/stop worrying 1  Worry too much - different things 1  Trouble relaxing 0  Restless 0  Easily annoyed or irritable 2  Afraid - awful might happen 0  Total GAD 7 Score 4     Review of Systems:   Pertinent items are noted in HPI Denies abnormal vaginal discharge w/ itching/odor/irritation, headaches, visual changes, shortness of breath, chest pain, abdominal pain, severe nausea/vomiting, or problems with urination or bowel movements unless otherwise stated above. Pertinent History Reviewed:  Reviewed past medical,surgical, social, obstetrical and family history.  Reviewed problem list, medications and allergies. Physical Assessment:   Vitals:   06/19/22 1203  BP: 120/82  Pulse: 90  Weight: 165 lb (74.8 kg)  Body mass index is 30.18 kg/m.            Physical Examination:   General appearance: alert, well appearing, and in no distress  Mental status: alert, oriented to person, place, and time  Skin: warm & dry   Extremities:      Cardiovascular: normal heart rate noted  Respiratory: normal respiratory effort, no distress  Abdomen: gravid, soft, non-tender  Pelvic: Cervical exam deferred         Fetal Status:     Movement: Present    Fetal Surveillance Testing today: BPP 8/8 UAD normal   Chaperone: N/A    No results found for this or any previous visit (from the past 24 hour(s)).  Assessment & Plan:  High-risk pregnancy: G3P1011 at [redacted]w[redacted]d with an Estimated Date of Delivery: 07/24/22      ICD-10-CM   1. Supervision of high risk pregnancy in third trimester  O09.93     2. Chronic hypertension affecting pregnancy: on prn labetalol  O10.919        Meds: No orders of the defined types were placed in this encounter.   Orders: No orders of the defined types were placed in this encounter.    Labs/procedures today: U/S  Treatment Plan:  weekly BPPs, IOL +/- 39 week  Reviewed: Preterm labor symptoms and general obstetric precautions including but not limited to vaginal bleeding, contractions, leaking of fluid and fetal movement were reviewed in detail with the patient.  All questions were answered. Does have home bp cuff. Office bp cuff given: not applicable. Check bp daily, let us know if consistently >150 and/or >95.  Follow-up: Return for keep scheduled.   Future Appointments  Date Time Provider Department Center  06/19/2022  1:30 PM Lazaro Arms, MD [facility]  06/26/2022  9:15 AM [facility] [place] [facility] None  06/26/2022 10:10 AM Myna Hidalgo, DO [facility]  07/03/2022  9:15 AM [facility] [place] [facility] None  07/03/2022 10:10 AM Myna Hidalgo, DO [facility]  07/10/2022  9:10 AM [facility] NURSE [facility]  07/10/2022  9:30 AM Jacklyn Shell, CNM [facility]  07/10/2022 10:10 AM Lazaro Arms, MD [facility]  07/17/2022 10:45 AM [facility] [place] [facility] None  07/17/2022 11:30 AM Myna Hidalgo, DO [facility]    No orders of the defined types were placed in this encounter.  Lazaro Arms  Attending Physician for the Center for [facility] 06/19/2022 12:14 PM

## 2022-06-19 NOTE — Progress Notes (Signed)
[place] 35 wks,cephalic,BPP 8/8,posterior placenta gr 2,RI .54,.58,.62,.53=48%,AFI 16 cm

## 2022-06-25 ENCOUNTER — Other Ambulatory Visit: Payer: Self-pay | Admitting: Obstetrics & Gynecology

## 2022-06-25 DIAGNOSIS — O10919 Unspecified pre-existing hypertension complicating pregnancy, unspecified trimester: Secondary | ICD-10-CM

## 2022-06-25 DIAGNOSIS — O0992 Supervision of high risk pregnancy, unspecified, second trimester: Secondary | ICD-10-CM

## 2022-06-26 ENCOUNTER — Ambulatory Visit (INDEPENDENT_AMBULATORY_CARE_PROVIDER_SITE_OTHER): Payer: BC Managed Care – PPO

## 2022-06-26 ENCOUNTER — Ambulatory Visit (INDEPENDENT_AMBULATORY_CARE_PROVIDER_SITE_OTHER): Payer: BC Managed Care – PPO | Admitting: Obstetrics & Gynecology

## 2022-06-26 ENCOUNTER — Other Ambulatory Visit (HOSPITAL_COMMUNITY)
Admission: RE | Admit: 2022-06-26 | Discharge: 2022-06-26 | Disposition: A | Discharge status: Home / Self Care | Payer: BC Managed Care – PPO | Source: Ambulatory Visit | Attending: Obstetrics & Gynecology | Admitting: Obstetrics & Gynecology

## 2022-06-26 ENCOUNTER — Encounter: Payer: Self-pay | Admitting: Obstetrics & Gynecology

## 2022-06-26 VITALS — BP 122/79 | HR 89 | Wt 160.4 lb

## 2022-06-26 DIAGNOSIS — O10913 Unspecified pre-existing hypertension complicating pregnancy, third trimester: Secondary | ICD-10-CM | POA: Diagnosis not present

## 2022-06-26 DIAGNOSIS — O10919 Unspecified pre-existing hypertension complicating pregnancy, unspecified trimester: Secondary | ICD-10-CM

## 2022-06-26 DIAGNOSIS — O0992 Supervision of high risk pregnancy, unspecified, second trimester: Secondary | ICD-10-CM

## 2022-06-26 DIAGNOSIS — Z3A36 36 weeks gestation of pregnancy: Secondary | ICD-10-CM

## 2022-06-26 DIAGNOSIS — O0990 Supervision of high risk pregnancy, unspecified, unspecified trimester: Secondary | ICD-10-CM

## 2022-06-26 DIAGNOSIS — O0993 Supervision of high risk pregnancy, unspecified, third trimester: Secondary | ICD-10-CM | POA: Diagnosis not present

## 2022-06-26 DIAGNOSIS — O26893 Other specified pregnancy related conditions, third trimester: Secondary | ICD-10-CM | POA: Insufficient documentation

## 2022-06-26 NOTE — Progress Notes (Signed)
[place] 36 wks,cephalic,posterior placenta gr 3,FHR 130 bpm,intermittent elevated RI's,.72,.63,.71,.63,.68,.67=85%,EFW 2449 g 16%,BPD 1.6,HC 4%,AC 23%,BPP 8/8

## 2022-06-26 NOTE — Progress Notes (Signed)
HIGH-RISK PREGNANCY VISIT Patient name: Allison Prince MRN [id number]  Date of birth: 02/11/1990 Chief Complaint:   Routine Prenatal Visit  History of Present Illness:   Allison Prince is a 32 y.o. G79P1011 female at [redacted]w[redacted]d with an Estimated Date of Delivery: 07/24/22 being seen today for ongoing management of a high-risk pregnancy complicated by  Chronic HTN- no meds, remains asymptomatic, BP appropriate  Today she reports no complaints.   Contractions: Not present. Vag. Bleeding: None.  Movement: Present. denies leaking of fluid.      01/06/2022    3:32 PM 11/22/2020    2:36 PM 03/15/2018   10:38 AM 11/10/2017    3:14 PM 02/27/2017   11:09 AM  Depression screen PHQ 2/9  Decreased Interest 1 0 0 0 0  Down, Depressed, Hopeless 0 0 0 0 0  PHQ - 2 Score 1 0 0 0 0  Altered sleeping 2    1  Tired, decreased energy 2    1  Change in appetite 2    1  Feeling bad or failure about yourself  0    0  Trouble concentrating 0      Moving slowly or fidgety/restless 0    0  Suicidal thoughts 0    0  PHQ-9 Score 7    3  Difficult doing work/chores     Not difficult at all     Current Outpatient Medications  Medication Instructions   labetalol (NORMODYNE) 200 mg, Oral, 2 times daily   Prenatal 27-1 MG TABS 1 tablet, Oral, Daily at bedtime     Review of Systems:   Pertinent items are noted in HPI Denies abnormal vaginal discharge w/ itching/odor/irritation, headaches, visual changes, shortness of breath, chest pain, abdominal pain, severe nausea/vomiting, or problems with urination or bowel movements unless otherwise stated above. Pertinent History Reviewed:  Reviewed past medical,surgical, social, obstetrical and family history.  Reviewed problem list, medications and allergies. Physical Assessment:   Vitals:   06/26/22 1000  BP: 122/79  Pulse: 89  Weight: 160 lb 6.4 oz (72.8 kg)  Body mass index is 29.34 kg/m.           Physical Examination:   General appearance:  alert, well appearing, and in no distress  Mental status: normal mood, behavior, speech, dress, motor activity, and thought processes  Skin: warm & dry   Extremities:      Cardiovascular: normal heart rate noted  Respiratory: normal respiratory effort, no distress  Abdomen: gravid, soft, non-tender  Pelvic: Cervical exam performed  Dilation: 1 Effacement (%): Thick Station: -3 vaginal swabs obtained  Fetal Status:     Movement: Present    Fetal Surveillance Testing today:  cephalic,posterior placenta gr 3,FHR 130 bpm,intermittent elevated RI's,.72,.63,.71,.63,.68,.67=85%,EFW 2449 g 16%,BPD 1.6,HC 4%,AC 23%,BPP 8/8          Chaperone: Faith Rogue    No results found for this or any previous visit (from the past 24 hour(s)).   Assessment & Plan:  High-risk pregnancy: G3P1011 at [redacted]w[redacted]d with an Estimated Date of Delivery: 07/24/22   1) Chronic HTN -continue antepartum testing -BPP 8/8 -IOL 38-39wks as BP stable without medication   Meds: No orders of the defined types were placed in this encounter.   Labs/procedures today: GBS, GC/C collected  Treatment Plan:  as outlined above  Reviewed: Term labor symptoms and general obstetric precautions including but not limited to vaginal bleeding, contractions, leaking of fluid and fetal movement were reviewed in detail with  the patient.  All questions were answered. Pt has home bp cuff. Check bp weekly, let us know if >140/90.   Follow-up: Return in about 1 week (around 07/03/2022) for HROB visit.   Future Appointments  Date Time Provider Department Center  07/03/2022  9:15 AM [facility] - [facility] [place] [facility] None  07/03/2022 10:10 AM Myna Hidalgo, DO [facility]  07/10/2022  9:10 AM [facility] NURSE [facility]  07/10/2022  9:30 AM Jacklyn Shell, CNM [facility]  07/10/2022 10:10 AM Lazaro Arms, MD [facility]  07/17/2022 10:45 AM [facility] [place] [facility] None  07/17/2022 11:30 AM Myna Hidalgo, DO [facility]    Orders Placed This Encounter  Procedures   Culture, beta strep (group b only)    Myna Hidalgo, DO Attending Obstetrician & Gynecologist, [facility] for [employer], [facility]

## 2022-06-27 ENCOUNTER — Encounter: Payer: Self-pay | Admitting: *Deleted

## 2022-06-27 LAB — CERVICOVAGINAL ANCILLARY ONLY
Chlamydia: NEGATIVE
Comment: NEGATIVE
Comment: NORMAL
Neisseria Gonorrhea: NEGATIVE

## 2022-06-29 LAB — CULTURE, BETA STREP (GROUP B ONLY): Strep Gp B Culture: POSITIVE — AB

## 2022-07-03 ENCOUNTER — Ambulatory Visit (INDEPENDENT_AMBULATORY_CARE_PROVIDER_SITE_OTHER): Payer: BC Managed Care – PPO

## 2022-07-03 ENCOUNTER — Ambulatory Visit (INDEPENDENT_AMBULATORY_CARE_PROVIDER_SITE_OTHER): Payer: BC Managed Care – PPO | Admitting: Obstetrics & Gynecology

## 2022-07-03 ENCOUNTER — Encounter: Payer: Self-pay | Admitting: Obstetrics & Gynecology

## 2022-07-03 VITALS — BP 124/84 | HR 77 | Wt 160.2 lb

## 2022-07-03 DIAGNOSIS — Z3A37 37 weeks gestation of pregnancy: Secondary | ICD-10-CM

## 2022-07-03 DIAGNOSIS — O10913 Unspecified pre-existing hypertension complicating pregnancy, third trimester: Secondary | ICD-10-CM

## 2022-07-03 DIAGNOSIS — O0993 Supervision of high risk pregnancy, unspecified, third trimester: Secondary | ICD-10-CM

## 2022-07-03 DIAGNOSIS — O0992 Supervision of high risk pregnancy, unspecified, second trimester: Secondary | ICD-10-CM

## 2022-07-03 DIAGNOSIS — O0990 Supervision of high risk pregnancy, unspecified, unspecified trimester: Secondary | ICD-10-CM

## 2022-07-03 DIAGNOSIS — O10919 Unspecified pre-existing hypertension complicating pregnancy, unspecified trimester: Secondary | ICD-10-CM

## 2022-07-03 NOTE — Progress Notes (Signed)
HIGH-RISK PREGNANCY VISIT Patient name: Allison Prince MRN [id number]  Date of birth: May 16, 1990 Chief Complaint:   Routine Prenatal Visit  History of Present Illness:   Allison Prince is a 32 y.o. G57P1011 female at [redacted]w[redacted]d with an Estimated Date of Delivery: 07/24/22 being seen today for ongoing management of a high-risk pregnancy complicated by:  -Chronic HTN No meds  Today she reports no complaints.   Contractions: Not present. Vag. Bleeding: None.  Movement: Present. denies leaking of fluid.      01/06/2022    3:32 PM 11/22/2020    2:36 PM 03/15/2018   10:38 AM 11/10/2017    3:14 PM 02/27/2017   11:09 AM  Depression screen PHQ 2/9  Decreased Interest 1 0 0 0 0  Down, Depressed, Hopeless 0 0 0 0 0  PHQ - 2 Score 1 0 0 0 0  Altered sleeping 2    1  Tired, decreased energy 2    1  Change in appetite 2    1  Feeling bad or failure about yourself  0    0  Trouble concentrating 0      Moving slowly or fidgety/restless 0    0  Suicidal thoughts 0    0  PHQ-9 Score 7    3  Difficult doing work/chores     Not difficult at all     Current Outpatient Medications  Medication Instructions   labetalol (NORMODYNE) 200 mg, Oral, 2 times daily   Prenatal 27-1 MG TABS 1 tablet, Oral, Daily at bedtime     Review of Systems:   Pertinent items are noted in HPI Denies abnormal vaginal discharge w/ itching/odor/irritation, headaches, visual changes, shortness of breath, chest pain, abdominal pain, severe nausea/vomiting, or problems with urination or bowel movements unless otherwise stated above. Pertinent History Reviewed:  Reviewed past medical,surgical, social, obstetrical and family history.  Reviewed problem list, medications and allergies. Physical Assessment:   Vitals:   07/03/22 1003  BP: 124/84  Pulse: 77  Weight: 160 lb 3.2 oz (72.7 kg)  Body mass index is 29.3 kg/m.           Physical Examination:   General appearance: alert, well appearing, and in no  distress  Mental status: normal mood, behavior, speech, dress, motor activity, and thought processes  Skin: warm & dry   Extremities: Edema: None    Cardiovascular: normal heart rate noted  Respiratory: normal respiratory effort, no distress  Abdomen: gravid, soft, non-tender  Pelvic: Cervical exam performed  Dilation: 1 Effacement (%): Thick Station: -3  Fetal Status:     Movement: Present Presentation: Vertex  Fetal Surveillance Testing today: BPP with NST   NST being performed due to chronic HTN, BPP 6/8   Fetal Monitoring:  Baseline: 130 bpm, Variability: moderate, Accelerations: present, The accelerations are >15 bpm and more than 2 in 20 minutes, and Decelerations: Absent     Final diagnosis:   Reactive NST   Chaperone:  Dr. Elaina Pattee     No results found for this or any previous visit (from the past 24 hour(s)).   Assessment & Plan:  High-risk pregnancy: G3P1011 at [redacted]w[redacted]d with an Estimated Date of Delivery: 07/24/22   1) Chronic HTN -no meds -continue antepartum testing BPP with NST 8/10 -scheduled for IOL next week  2) GBS pos -PCN in labor  Contraception: desires salpingectomy/sterilization  Meds: No orders of the defined types were placed in this encounter.   Labs/procedures today: BPP, NST  Treatment Plan:  as outlined above  Reviewed: Term labor symptoms and general obstetric precautions including but not limited to vaginal bleeding, contractions, leaking of fluid and fetal movement were reviewed in detail with the patient.  All questions were answered. Pt has home bp cuff. Check bp weekly, let us know if >140/90.   Follow-up: Return in about 1 week (around 07/10/2022) for follow up as scheduled for next week.   Future Appointments  Date Time Provider Department Center  07/10/2022  9:10 AM [facility] NURSE [facility]  07/10/2022  9:30 AM Jacklyn Shell, CNM [facility]  07/10/2022 10:10 AM Lazaro Arms, MD [facility]  07/11/2022 12:00  AM [facility] SCHED ROOM [facility] None    No orders of the defined types were placed in this encounter.   Myna Hidalgo, DO Attending Obstetrician & Gynecologist, [facility] [employer], [facility]

## 2022-07-03 NOTE — Progress Notes (Signed)
[place] 37 wks,cephalic,BPP 6/8 no breathing,RI .46,.50,.55,.51=38%,AFI 16 cm,fhr 140 bpm

## 2022-07-10 ENCOUNTER — Other Ambulatory Visit: Payer: BC Managed Care – PPO

## 2022-07-10 ENCOUNTER — Ambulatory Visit (INDEPENDENT_AMBULATORY_CARE_PROVIDER_SITE_OTHER): Payer: BC Managed Care – PPO | Admitting: Advanced Practice Midwife

## 2022-07-10 ENCOUNTER — Encounter: Payer: Self-pay | Admitting: Advanced Practice Midwife

## 2022-07-10 ENCOUNTER — Encounter: Payer: BC Managed Care – PPO | Admitting: Obstetrics & Gynecology

## 2022-07-10 ENCOUNTER — Telehealth (HOSPITAL_COMMUNITY): Payer: Self-pay | Admitting: *Deleted

## 2022-07-10 VITALS — BP 126/86 | HR 82 | Wt 161.4 lb

## 2022-07-10 DIAGNOSIS — O10913 Unspecified pre-existing hypertension complicating pregnancy, third trimester: Secondary | ICD-10-CM

## 2022-07-10 DIAGNOSIS — O0993 Supervision of high risk pregnancy, unspecified, third trimester: Secondary | ICD-10-CM

## 2022-07-10 DIAGNOSIS — O10919 Unspecified pre-existing hypertension complicating pregnancy, unspecified trimester: Secondary | ICD-10-CM

## 2022-07-10 DIAGNOSIS — Z3A38 38 weeks gestation of pregnancy: Secondary | ICD-10-CM

## 2022-07-10 NOTE — Telephone Encounter (Signed)
Preadmission screen  

## 2022-07-10 NOTE — Progress Notes (Signed)
HIGH-RISK PREGNANCY VISIT Patient name: Allison Prince MRN [id number]  Date of birth: Aug 26, 1990 Chief Complaint:   Routine Prenatal Visit and Non-stress Test  History of Present Illness:   Allison Prince is a 32 y.o. G61P1011 female at [redacted]w[redacted]d with an Estimated Date of Delivery: 07/24/22 being seen today for ongoing management of a high-risk pregnancy complicated by chronic hypertension currently on no meds. .    Today she reports no complaints. Contractions: Not present.  .  Movement: Present. denies leaking of fluid.      01/06/2022    3:32 PM 11/22/2020    2:36 PM 03/15/2018   10:38 AM 11/10/2017    3:14 PM 02/27/2017   11:09 AM  Depression screen PHQ 2/9  Decreased Interest 1 0 0 0 0  Down, Depressed, Hopeless 0 0 0 0 0  PHQ - 2 Score 1 0 0 0 0  Altered sleeping 2    1  Tired, decreased energy 2    1  Change in appetite 2    1  Feeling bad or failure about yourself  0    0  Trouble concentrating 0      Moving slowly or fidgety/restless 0    0  Suicidal thoughts 0    0  PHQ-9 Score 7    3  Difficult doing work/chores     Not difficult at all        01/06/2022    3:32 PM  GAD 7 : Generalized Anxiety Score  Nervous, Anxious, on Edge 0  Control/stop worrying 1  Worry too much - different things 1  Trouble relaxing 0  Restless 0  Easily annoyed or irritable 2  Afraid - awful might happen 0  Total GAD 7 Score 4     Review of Systems:   Pertinent items are noted in HPI Denies abnormal vaginal discharge w/ itching/odor/irritation, headaches, visual changes, shortness of breath, chest pain, abdominal pain, severe nausea/vomiting, or problems with urination or bowel movements unless otherwise stated above. Pertinent History Reviewed:  Reviewed past medical,surgical, social, obstetrical and family history.  Reviewed problem list, medications and allergies. Physical Assessment:   Vitals:   07/10/22 0913  BP: 126/86  Pulse: 82  Weight: 161 lb 6.4 oz (73.2 kg)   Body mass index is 29.52 kg/m.           Physical Examination:   General appearance: alert, well appearing, and in no distress  Mental status: alert, oriented to person, place, and time  Skin: warm & dry   Extremities: Edema: Trace    Cardiovascular: normal heart rate noted  Respiratory: normal respiratory effort, no distress  Abdomen: gravid, soft, non-tender  Pelvic: Cervical exam deferred         Fetal Status:     Movement: Present    Fetal Surveillance Testing today: NST: FHR baseline 140 bpm, Variability: moderate, Accelerations:present, Decelerations:  Absent= Cat 1/Reactive   Chaperone: N/A    No results found for this or any previous visit (from the past 24 hour(s)).  Assessment & Plan:  High-risk pregnancy: G3P1011 at [redacted]w[redacted]d with an Estimated Date of Delivery: 07/24/22   1. Supervision of high risk pregnancy in third trimester  2.  CHTN, no meds:  IOL set for tonight    Meds: No orders of the defined types were placed in this encounter.   Orders: No orders of the defined types were placed in this encounter.    Labs/procedures today: NST  Reviewed: Term labor symptoms and general obstetric precautions including but not limited to vaginal bleeding, contractions, leaking of fluid and fetal movement were reviewed in detail with the patient.  All questions were answered.  Follow-up: Return in about 8 days (around 07/18/2022) for mychart video BP check; 6 weeks mychart video postpartum.   Future Appointments  Date Time Provider Department Center  07/11/2022 12:00 AM [facility] SCHED ROOM [facility] None    No orders of the defined types were placed in this encounter.  Jacklyn Shell , DNP, CNM [facility] 07/10/2022 10:18 AM

## 2022-07-11 ENCOUNTER — Inpatient Hospital Stay (HOSPITAL_COMMUNITY): Payer: BC Managed Care – PPO | Admitting: Anesthesiology

## 2022-07-11 ENCOUNTER — Other Ambulatory Visit: Payer: Self-pay

## 2022-07-11 ENCOUNTER — Inpatient Hospital Stay (HOSPITAL_COMMUNITY): Admission: RE | Admit: 2022-07-11 | Payer: BC Managed Care – PPO | Source: Ambulatory Visit

## 2022-07-11 ENCOUNTER — Encounter (HOSPITAL_COMMUNITY): Payer: Self-pay | Admitting: Family Medicine

## 2022-07-11 ENCOUNTER — Inpatient Hospital Stay (HOSPITAL_COMMUNITY)
Admission: RE | Admit: 2022-07-11 | Discharge: 2022-07-13 | DRG: 798 | Disposition: A | Discharge status: Home / Self Care | Payer: BC Managed Care – PPO | Attending: Obstetrics and Gynecology | Admitting: Obstetrics and Gynecology

## 2022-07-11 DIAGNOSIS — O0993 Supervision of high risk pregnancy, unspecified, third trimester: Secondary | ICD-10-CM

## 2022-07-11 DIAGNOSIS — F17200 Nicotine dependence, unspecified, uncomplicated: Secondary | ICD-10-CM | POA: Diagnosis not present

## 2022-07-11 DIAGNOSIS — Z302 Encounter for sterilization: Secondary | ICD-10-CM | POA: Diagnosis not present

## 2022-07-11 DIAGNOSIS — O99820 Streptococcus B carrier state complicating pregnancy: Secondary | ICD-10-CM | POA: Diagnosis not present

## 2022-07-11 DIAGNOSIS — Z3009 Encounter for other general counseling and advice on contraception: Secondary | ICD-10-CM | POA: Diagnosis present

## 2022-07-11 DIAGNOSIS — O1092 Unspecified pre-existing hypertension complicating childbirth: Secondary | ICD-10-CM | POA: Diagnosis not present

## 2022-07-11 DIAGNOSIS — O1002 Pre-existing essential hypertension complicating childbirth: Secondary | ICD-10-CM | POA: Diagnosis not present

## 2022-07-11 DIAGNOSIS — D259 Leiomyoma of uterus, unspecified: Secondary | ICD-10-CM | POA: Diagnosis present

## 2022-07-11 DIAGNOSIS — Z3A38 38 weeks gestation of pregnancy: Secondary | ICD-10-CM | POA: Diagnosis not present

## 2022-07-11 DIAGNOSIS — O3413 Maternal care for benign tumor of corpus uteri, third trimester: Secondary | ICD-10-CM | POA: Diagnosis not present

## 2022-07-11 DIAGNOSIS — O99824 Streptococcus B carrier state complicating childbirth: Secondary | ICD-10-CM | POA: Diagnosis not present

## 2022-07-11 DIAGNOSIS — O0990 Supervision of high risk pregnancy, unspecified, unspecified trimester: Secondary | ICD-10-CM

## 2022-07-11 DIAGNOSIS — F17210 Nicotine dependence, cigarettes, uncomplicated: Secondary | ICD-10-CM | POA: Diagnosis present

## 2022-07-11 DIAGNOSIS — O10919 Unspecified pre-existing hypertension complicating pregnancy, unspecified trimester: Secondary | ICD-10-CM

## 2022-07-11 DIAGNOSIS — O99334 Smoking (tobacco) complicating childbirth: Secondary | ICD-10-CM | POA: Diagnosis present

## 2022-07-11 DIAGNOSIS — Z91040 Latex allergy status: Secondary | ICD-10-CM | POA: Diagnosis not present

## 2022-07-11 DIAGNOSIS — Z9851 Tubal ligation status: Secondary | ICD-10-CM

## 2022-07-11 DIAGNOSIS — O164 Unspecified maternal hypertension, complicating childbirth: Secondary | ICD-10-CM | POA: Diagnosis not present

## 2022-07-11 DIAGNOSIS — I10 Essential (primary) hypertension: Secondary | ICD-10-CM | POA: Diagnosis present

## 2022-07-11 LAB — CBC
HCT: 32.1 % — ABNORMAL LOW (ref 36.0–46.0)
HCT: 35 % — ABNORMAL LOW (ref 36.0–46.0)
Hemoglobin: 10.9 g/dL — ABNORMAL LOW (ref 12.0–15.0)
Hemoglobin: 11.9 g/dL — ABNORMAL LOW (ref 12.0–15.0)
MCH: 29.3 pg (ref 26.0–34.0)
MCH: 29.8 pg (ref 26.0–34.0)
MCHC: 34 g/dL (ref 30.0–36.0)
MCHC: 34 g/dL (ref 30.0–36.0)
MCV: 86.3 fL (ref 80.0–100.0)
MCV: 87.7 fL (ref 80.0–100.0)
Platelets: 306 10*3/uL (ref 150–400)
Platelets: 330 10*3/uL (ref 150–400)
RBC: 3.72 MIL/uL — ABNORMAL LOW (ref 3.87–5.11)
RBC: 3.99 MIL/uL (ref 3.87–5.11)
RDW: 13.9 % (ref 11.5–15.5)
RDW: 13.9 % (ref 11.5–15.5)
WBC: 8.4 10*3/uL (ref 4.0–10.5)
WBC: 9.4 10*3/uL (ref 4.0–10.5)
nRBC: 0 % (ref 0.0–0.2)
nRBC: 0 % (ref 0.0–0.2)

## 2022-07-11 LAB — COMPREHENSIVE METABOLIC PANEL
ALT: 15 U/L (ref 0–44)
AST: 17 U/L (ref 15–41)
Albumin: 3 g/dL — ABNORMAL LOW (ref 3.5–5.0)
Alkaline Phosphatase: 178 U/L — ABNORMAL HIGH (ref 38–126)
Anion gap: 11 (ref 5–15)
BUN: 7 mg/dL (ref 6–20)
CO2: 17 mmol/L — ABNORMAL LOW (ref 22–32)
Calcium: 8.9 mg/dL (ref 8.9–10.3)
Chloride: 108 mmol/L (ref 98–111)
Creatinine, Ser: 0.5 mg/dL (ref 0.44–1.00)
GFR, Estimated: >60 mL/min (ref >60)
Glucose, Bld: 101 mg/dL — ABNORMAL HIGH (ref 70–99)
Potassium: 3.1 mmol/L — ABNORMAL LOW (ref 3.5–5.1)
Sodium: 136 mmol/L (ref 135–145)
Total Bilirubin: 0.6 mg/dL (ref 0.3–1.2)
Total Protein: 6.2 g/dL — ABNORMAL LOW (ref 6.5–8.1)

## 2022-07-11 LAB — TYPE AND SCREEN
ABO/RH(D): O POS
Antibody Screen: NEGATIVE

## 2022-07-11 LAB — PROTEIN / CREATININE RATIO, URINE
Creatinine, Urine: 322 mg/dL
Protein Creatinine Ratio: 0.12 mg/mg{Cre} (ref 0.00–0.15)
Total Protein, Urine: 38 mg/dL

## 2022-07-11 LAB — RPR: RPR Ser Ql: NONREACTIVE

## 2022-07-11 MED ORDER — LIDOCAINE HCL (PF) 1 % IJ SOLN
INTRAMUSCULAR | Status: DC | PRN
  Administered 2022-07-11: 5 mL via EPIDURAL
  Administered 2022-07-11: 3 mL via EPIDURAL

## 2022-07-11 MED ORDER — ONDANSETRON HCL 4 MG PO TABS: 4.0000 mg | ORAL_TABLET | ORAL | Status: DC | PRN

## 2022-07-11 MED ORDER — FUROSEMIDE 20 MG PO TABS
20.0000 mg | ORAL_TABLET | Freq: Every day | ORAL | Status: DC
  Administered 2022-07-13: 20 mg via ORAL
  Filled 2022-07-11: qty 1

## 2022-07-11 MED ORDER — ZOLPIDEM TARTRATE 5 MG PO TABS: 5.0000 mg | ORAL_TABLET | Freq: Every evening | ORAL | Status: DC | PRN

## 2022-07-11 MED ORDER — SODIUM CHLORIDE 0.9 % IV SOLN
5.0000 10*6.[IU] | Freq: Once | INTRAVENOUS | Status: AC
  Administered 2022-07-11: 5 10*6.[IU] via INTRAVENOUS
  Filled 2022-07-11: qty 5

## 2022-07-11 MED ORDER — ONDANSETRON HCL 4 MG/2ML IJ SOLN: 4.0000 mg | INTRAMUSCULAR | Status: DC | PRN

## 2022-07-11 MED ORDER — BENZOCAINE-MENTHOL 20-0.5 % EX AERO: 1.0000 | INHALATION_SPRAY | CUTANEOUS | Status: DC | PRN

## 2022-07-11 MED ORDER — DIPHENHYDRAMINE HCL 50 MG/ML IJ SOLN
12.5000 mg | INTRAMUSCULAR | Status: DC | PRN
  Administered 2022-07-11 (×2): 12.5 mg via INTRAVENOUS
  Filled 2022-07-11 (×2): qty 1

## 2022-07-11 MED ORDER — LACTATED RINGERS IV SOLN: INTRAVENOUS | Status: DC

## 2022-07-11 MED ORDER — OXYCODONE-ACETAMINOPHEN 5-325 MG PO TABS: 2.0000 | ORAL_TABLET | ORAL | Status: DC | PRN

## 2022-07-11 MED ORDER — PHENYLEPHRINE 80 MCG/ML (10ML) SYRINGE FOR IV PUSH (FOR BLOOD PRESSURE SUPPORT): 80.0000 ug | PREFILLED_SYRINGE | INTRAVENOUS | Status: DC | PRN

## 2022-07-11 MED ORDER — TERBUTALINE SULFATE 1 MG/ML IJ SOLN: 0.2500 mg | Freq: Once | INTRAMUSCULAR | Status: DC | PRN

## 2022-07-11 MED ORDER — SIMETHICONE 80 MG PO CHEW: 80.0000 mg | CHEWABLE_TABLET | ORAL | Status: DC | PRN

## 2022-07-11 MED ORDER — EPHEDRINE 5 MG/ML INJ: 10.0000 mg | INTRAVENOUS | Status: DC | PRN

## 2022-07-11 MED ORDER — OXYTOCIN-SODIUM CHLORIDE 30-0.9 UT/500ML-% IV SOLN: 2.5000 [IU]/h | INTRAVENOUS | Status: DC

## 2022-07-11 MED ORDER — DIPHENHYDRAMINE HCL 25 MG PO CAPS
25.0000 mg | ORAL_CAPSULE | Freq: Four times a day (QID) | ORAL | Status: DC | PRN
  Administered 2022-07-12: 25 mg via ORAL
  Filled 2022-07-11: qty 1

## 2022-07-11 MED ORDER — COCONUT OIL OIL: 1.0000 | TOPICAL_OIL | Status: DC | PRN

## 2022-07-11 MED ORDER — OXYCODONE-ACETAMINOPHEN 5-325 MG PO TABS: 1.0000 | ORAL_TABLET | ORAL | Status: DC | PRN

## 2022-07-11 MED ORDER — TETANUS-DIPHTH-ACELL PERTUSSIS 5-2.5-18.5 LF-MCG/0.5 IM SUSY: 0.5000 mL | PREFILLED_SYRINGE | Freq: Once | INTRAMUSCULAR | Status: DC

## 2022-07-11 MED ORDER — MISOPROSTOL 25 MCG QUARTER TABLET: 25.0000 ug | ORAL_TABLET | Freq: Once | ORAL | Status: DC

## 2022-07-11 MED ORDER — PENICILLIN G POT IN DEXTROSE 60000 UNIT/ML IV SOLN
3.0000 10*6.[IU] | INTRAVENOUS | Status: DC
  Administered 2022-07-11 (×3): 3 10*6.[IU] via INTRAVENOUS
  Filled 2022-07-11 (×3): qty 50

## 2022-07-11 MED ORDER — LACTATED RINGERS IV SOLN
500.0000 mL | Freq: Once | INTRAVENOUS | Status: AC
  Administered 2022-07-11: 500 mL via INTRAVENOUS

## 2022-07-11 MED ORDER — SENNOSIDES-DOCUSATE SODIUM 8.6-50 MG PO TABS
2.0000 | ORAL_TABLET | Freq: Every day | ORAL | Status: DC
  Administered 2022-07-13: 2 via ORAL
  Filled 2022-07-11: qty 2

## 2022-07-11 MED ORDER — SOD CITRATE-CITRIC ACID 500-334 MG/5ML PO SOLN: 30.0000 mL | ORAL | Status: DC | PRN

## 2022-07-11 MED ORDER — MISOPROSTOL 50MCG HALF TABLET
50.0000 ug | ORAL_TABLET | Freq: Once | ORAL | Status: AC
  Administered 2022-07-11: 50 ug via ORAL
  Filled 2022-07-11: qty 1

## 2022-07-11 MED ORDER — FENTANYL CITRATE (PF) 100 MCG/2ML IJ SOLN
100.0000 ug | INTRAMUSCULAR | Status: DC | PRN
  Administered 2022-07-11: 100 ug via INTRAVENOUS
  Filled 2022-07-11: qty 2

## 2022-07-11 MED ORDER — PRENATAL MULTIVITAMIN CH
1.0000 | ORAL_TABLET | Freq: Every day | ORAL | Status: DC
  Administered 2022-07-12 – 2022-07-13 (×2): 1 via ORAL
  Filled 2022-07-11 (×2): qty 1

## 2022-07-11 MED ORDER — MISOPROSTOL 25 MCG QUARTER TABLET
25.0000 ug | ORAL_TABLET | ORAL | Status: DC
  Administered 2022-07-11: 25 ug via BUCCAL
  Filled 2022-07-11: qty 1

## 2022-07-11 MED ORDER — LACTATED RINGERS IV SOLN: 500.0000 mL | INTRAVENOUS | Status: DC | PRN

## 2022-07-11 MED ORDER — HYDROXYZINE HCL 50 MG PO TABS: 50.0000 mg | ORAL_TABLET | Freq: Four times a day (QID) | ORAL | Status: DC | PRN

## 2022-07-11 MED ORDER — DIBUCAINE (PERIANAL) 1 % EX OINT: 1.0000 | TOPICAL_OINTMENT | CUTANEOUS | Status: DC | PRN

## 2022-07-11 MED ORDER — ONDANSETRON HCL 4 MG/2ML IJ SOLN: 4.0000 mg | Freq: Four times a day (QID) | INTRAMUSCULAR | Status: DC | PRN

## 2022-07-11 MED ORDER — OXYTOCIN BOLUS FROM INFUSION
333.0000 mL | Freq: Once | INTRAVENOUS | Status: AC
  Administered 2022-07-11: 333 mL via INTRAVENOUS

## 2022-07-11 MED ORDER — LIDOCAINE HCL (PF) 1 % IJ SOLN: 30.0000 mL | INTRAMUSCULAR | Status: DC | PRN

## 2022-07-11 MED ORDER — FENTANYL-BUPIVACAINE-NACL 0.5-0.125-0.9 MG/250ML-% EP SOLN
12.0000 mL/h | EPIDURAL | Status: DC | PRN
  Administered 2022-07-11: 12 mL/h via EPIDURAL
  Filled 2022-07-11: qty 250

## 2022-07-11 MED ORDER — FLEET ENEMA 7-19 GM/118ML RE ENEM: 1.0000 | ENEMA | RECTAL | Status: DC | PRN

## 2022-07-11 MED ORDER — ACETAMINOPHEN 325 MG PO TABS
650.0000 mg | ORAL_TABLET | ORAL | Status: DC | PRN
  Administered 2022-07-12 – 2022-07-13 (×2): 650 mg via ORAL
  Filled 2022-07-11 (×2): qty 2

## 2022-07-11 MED ORDER — WITCH HAZEL-GLYCERIN EX PADS: 1.0000 | MEDICATED_PAD | CUTANEOUS | Status: DC | PRN

## 2022-07-11 MED ORDER — MISOPROSTOL 50MCG HALF TABLET: 50.0000 ug | ORAL_TABLET | Freq: Once | ORAL | Status: DC

## 2022-07-11 MED ORDER — OXYTOCIN-SODIUM CHLORIDE 30-0.9 UT/500ML-% IV SOLN
1.0000 m[IU]/min | INTRAVENOUS | Status: DC
  Administered 2022-07-11: 2 m[IU]/min via INTRAVENOUS
  Filled 2022-07-11: qty 500

## 2022-07-11 MED ORDER — IBUPROFEN 600 MG PO TABS
600.0000 mg | ORAL_TABLET | Freq: Four times a day (QID) | ORAL | Status: DC
  Administered 2022-07-11 – 2022-07-13 (×7): 600 mg via ORAL
  Filled 2022-07-11 (×7): qty 1

## 2022-07-11 MED ORDER — ACETAMINOPHEN 325 MG PO TABS: 650.0000 mg | ORAL_TABLET | ORAL | Status: DC | PRN

## 2022-07-11 NOTE — Anesthesia Preprocedure Evaluation (Signed)
Anesthesia Evaluation  Patient identified by MRN, date of birth, ID band Patient awake    Reviewed: Allergy & Precautions, NPO status , Patient's Chart, lab work & pertinent test results  History of Anesthesia Complications Negative for: history of anesthetic complications  Airway Mallampati: III  TM Distance: >3 FB Neck ROM: Full    Dental   Pulmonary Current Smoker   Pulmonary exam normal breath sounds clear to auscultation       Cardiovascular hypertension, Pt. on home beta blockers  Rhythm:Regular Rate:Normal     Neuro/Psych negative neurological ROS     GI/Hepatic negative GI ROS, Neg liver ROS,,,  Endo/Other  negative endocrine ROS    Renal/GU negative Renal ROS     Musculoskeletal   Abdominal   Peds  Hematology negative hematology ROS (+)   Anesthesia Other Findings   Reproductive/Obstetrics (+) Pregnancy                              Anesthesia Physical Anesthesia Plan  ASA: 2  Anesthesia Plan: Epidural   Post-op Pain Management:    Induction:   PONV Risk Score and Plan:   Airway Management Planned:   Additional Equipment:   Intra-op Plan:   Post-operative Plan:   Informed Consent: I have reviewed the patients History and Physical, chart, labs and discussed the procedure including the risks, benefits and alternatives for the proposed anesthesia with the patient or authorized representative who has indicated his/her understanding and acceptance.       Plan Discussed with: Anesthesiologist  Anesthesia Plan Comments: (I have discussed risks of neuraxial anesthesia including but not limited to infection, bleeding, nerve injury, back pain, headache, seizures, and failure of block. Patient denies bleeding disorders and is not currently anticoagulated. Labs have been reviewed. Risks and benefits discussed. All patient's questions answered.  )          Anesthesia Quick Evaluation

## 2022-07-11 NOTE — Progress Notes (Signed)
Ultrasound at bedside to verify fetal presentation: vertex

## 2022-07-11 NOTE — Anesthesia Procedure Notes (Signed)
Epidural Patient location during procedure: OB Start time: 07/11/2022 11:58 AM End time: 07/11/2022 12:06 PM  Staffing Anesthesiologist: Linton Rump, MD Performed: anesthesiologist   Preanesthetic Checklist Completed: patient identified, IV checked, site marked, risks and benefits discussed, surgical consent, monitors and equipment checked, pre-op evaluation and timeout performed  Epidural Patient position: sitting Prep: DuraPrep and site prepped and draped Patient monitoring: continuous pulse ox and blood pressure Approach: midline Location: L3-L4 Injection technique: LOR saline  Needle:  Needle type: Tuohy  Needle gauge: 17 G Needle length: 9 cm and 9 Needle insertion depth: 7 cm Catheter type: closed end flexible Catheter size: 19 Gauge Catheter at skin depth: 11 cm Test dose: negative  Assessment Events: blood not aspirated, no cerebrospinal fluid, injection not painful, no injection resistance, no paresthesia and negative IV test  Additional Notes The patient has requested an epidural for labor pain management. Risks and benefits including, but not limited to, infection, bleeding, local anesthetic toxicity, headache, hypotension, back pain, block failure, etc. were discussed with the patient. The patient expressed understanding and consented to the procedure. I confirmed that the patient has no bleeding disorders and is not taking blood thinners. I confirmed the patient's last platelet count with the nurse. A time-out was performed immediately prior to the procedure. Please see nursing documentation for vital signs. Sterile technique was used throughout the whole procedure. Once LOR achieved, the epidural catheter threaded easily without resistance. Aspiration of the catheter was negative for blood and CSF. The epidural was dosed slowly and an infusion was started.  2 attempt(s)Reason for block:procedure for pain

## 2022-07-11 NOTE — Progress Notes (Signed)
Labor Progress Note DERIONA GALATI is a 32 y.o. G3P1011 at [redacted]w[redacted]d presented for IOL 2/2 CHTN.   S: Received epidural, comfortable. No acute concerns. Agreeable to AROM.  O:  BP 123/87   Pulse 80   Temp 97.7 F (36.5 C) (Oral)   Resp 18   Ht 5\' 2"  (1.575 m)   Wt 73.5 kg   LMP 10/17/2021 (Approximate)   SpO2 100%   BMI 29.63 kg/m   CVE: Dilation: 4.5 Effacement (%): 70 Station: -2 Presentation: Vertex Exam by:: Dr. Nobie Putnam   A&P: 32 y.o. G3P1011 [redacted]w[redacted]d IOL 2/2 CHTN.  #Labor: Attempted AROM, did not have return of fluid. Plan to increase pitocin, monitor for leakage. Consider repeat AROM attemp if necessary. Recheck in 3-4 hours. #Pain: Epidural #FWB: Cat II #GBS positive on PCN #CHTN: Normotensive  Tiffany Kocher, DO 12:52 PM

## 2022-07-11 NOTE — Plan of Care (Signed)
  Problem: Education: Goal: Knowledge of General Education information will improve Description: Including pain rating scale, medication(s)/side effects and non-pharmacologic comfort measures Outcome: Progressing   Problem: Health Behavior/Discharge Planning: Goal: Ability to manage health-related needs will improve Outcome: Progressing   Problem: Clinical Measurements: Goal: Ability to maintain clinical measurements within normal limits will improve Outcome: Progressing Goal: Will remain free from infection Outcome: Progressing Goal: Diagnostic test results will improve Outcome: Progressing Goal: Respiratory complications will improve Outcome: Progressing Goal: Cardiovascular complication will be avoided Outcome: Progressing   Problem: Activity: Goal: Risk for activity intolerance will decrease Outcome: Progressing   Problem: Nutrition: Goal: Adequate nutrition will be maintained Outcome: Progressing   Problem: Coping: Goal: Level of anxiety will decrease Outcome: Progressing   Problem: Elimination: Goal: Will not experience complications related to bowel motility Outcome: Progressing Goal: Will not experience complications related to urinary retention Outcome: Progressing   Problem: Pain Managment: Goal: General experience of comfort will improve Outcome: Progressing   Problem: Safety: Goal: Ability to remain free from injury will improve Outcome: Progressing   Problem: Skin Integrity: Goal: Risk for impaired skin integrity will decrease Outcome: Progressing   Problem: Education: Goal: Knowledge of Childbirth will improve Outcome: Progressing Goal: Ability to make informed decisions regarding treatment and plan of care will improve Outcome: Progressing Goal: Ability to state and carry out methods to decrease the pain will improve Outcome: Progressing Goal: Individualized Educational Video(s) Outcome: Progressing   Problem: Coping: Goal: Ability to  verbalize concerns and feelings about labor and delivery will improve Outcome: Progressing   Problem: Life Cycle: Goal: Ability to make normal progression through stages of labor will improve Outcome: Progressing Goal: Ability to effectively push during vaginal delivery will improve Outcome: Progressing   Problem: Role Relationship: Goal: Will demonstrate positive interactions with the child Outcome: Progressing   Problem: Safety: Goal: Risk of complications during labor and delivery will decrease Outcome: Progressing   Problem: Pain Management: Goal: Relief or control of pain from uterine contractions will improve Outcome: Progressing   Problem: Education: Goal: Knowledge of disease or condition will improve Outcome: Progressing Goal: Knowledge of the prescribed therapeutic regimen will improve Outcome: Progressing   Problem: Fluid Volume: Goal: Peripheral tissue perfusion will improve Outcome: Progressing   Problem: Clinical Measurements: Goal: Complications related to disease process, condition or treatment will be avoided or minimized Outcome: Progressing   

## 2022-07-11 NOTE — Progress Notes (Signed)
Labor Progress Note Allison Prince is a 32 y.o. G3P1011 at [redacted]w[redacted]d presented for IOL 2/2 CHTN.   S: Comfortable with epidural  O:  BP 117/83   Pulse 86   Temp 97.7 F (36.5 C) (Oral)   Resp 18   Ht 5\' 2"  (1.575 m)   Wt 73.5 kg   LMP 10/17/2021 (Approximate)   SpO2 100%   BMI 29.63 kg/m   CVE: Dilation: 5 Effacement (%): 70 Station: -2 Presentation: Vertex Exam by:: Dr. Nobie Putnam   A&P: 32 y.o. G3P1011 [redacted]w[redacted]d IOL 2/2 CHTN.  #Labor: AROM with clear fluid. Patient tolerated will  #Pain: Epidural #FWB: Cat 1 #GBS positive on PCN #CHTN: Normotensive  Allison Savage, MD 3:12 PM

## 2022-07-11 NOTE — Discharge Summary (Shared)
Postpartum Discharge Summary  Date of Service updated***     Patient Name: Allison Prince DOB: Aug 30, 1990 MRN: [id number]  Date of admission: 07/11/2022 Delivery date:07/11/2022  Delivering provider: Tiffany Kocher  Date of discharge: 07/11/2022  Admitting diagnosis: Indication for care in labor or delivery [O75.9] Intrauterine pregnancy: [redacted]w[redacted]d     Secondary diagnosis:  Principal Problem:   Indication for care in labor or delivery Active Problems:   Uterine fibroids   Hypertension   Supervision of high-risk pregnancy   Unwanted fertility  Additional problems: ***    Discharge diagnosis: Term Pregnancy Delivered and CHTN                                              Post partum procedures:{Postpartum procedures:23558} Augmentation: AROM, Pitocin, Cytotec, and IP Foley Complications: None  Hospital course: Induction of Labor With Vaginal Delivery   32 y.o. yo G3P1011 at [redacted]w[redacted]d was admitted to the hospital 07/11/2022 for induction of labor.  Indication for induction:  cHTN .  Patient had an uncomplicated labor course.  Membrane Rupture Time/Date: 3:07 PM ,07/11/2022   Delivery Method:Vaginal, Spontaneous  Episiotomy: None  Lacerations:  Periurethral  Details of delivery can be found in separate delivery note.  Patient had a postpartum course complicated by***. Patient is discharged home 07/11/22.  Newborn Data: Birth date:07/11/2022  Birth time:7:04 PM  Gender:Female  Living status:Living  Apgars:4 ,8  Weight:2460 g   Magnesium Sulfate received: No BMZ received: No Rhophylac:N/A MMR:N/A T-DaP: ordered postpartum Flu: N/A Transfusion:{Transfusion received:30440034}  Physical exam  Vitals:   07/11/22 1701 07/11/22 1731 07/11/22 1750 07/11/22 1801  BP: 120/83 124/79  119/73  Pulse: 82 84  85  Resp:      Temp:   98.2 F (36.8 C)   TempSrc:   Oral   SpO2:      Weight:      Height:       General: {Exam; general:21111117} Lochia: {Desc;  appropriate/inappropriate:30686::"appropriate"} Uterine Fundus: {Desc; firm/soft:30687} Incision: {Exam; incision:21111123} DVT Evaluation: {Exam; dvt:2111122} Labs: Lab Results  Component Value Date   WBC 9.4 07/11/2022   HGB 10.9 (L) 07/11/2022   HCT 32.1 (L) 07/11/2022   MCV 86.3 07/11/2022   PLT 306 07/11/2022      Latest Ref Rng & Units 07/11/2022   12:14 AM  CMP  Glucose 70 - 99 mg/dL 409   BUN 6 - 20 mg/dL 7   Creatinine 8.11 - 9.14 mg/dL 7.82   Sodium 956 - 213 mmol/L 136   Potassium 3.5 - 5.1 mmol/L 3.1   Chloride 98 - 111 mmol/L 108   CO2 22 - 32 mmol/L 17   Calcium 8.9 - 10.3 mg/dL 8.9   Total Protein 6.5 - 8.1 g/dL 6.2   Total Bilirubin 0.3 - 1.2 mg/dL 0.6   Alkaline Phos 38 - 126 U/L 178   AST 15 - 41 U/L 17   ALT 0 - 44 U/L 15    Edinburgh Score:    02/27/2017   11:30 AM  Edinburgh Postnatal Depression Scale Screening Tool  I have been able to laugh and see the funny side of things. 0  I have looked forward with enjoyment to things. 0  I have blamed myself unnecessarily when things went wrong. 1  I have been anxious or worried for no good reason. 2  I have  felt scared or panicky for no good reason. 0  Things have been getting on top of me. 0  I have been so unhappy that I have had difficulty sleeping. 1  I have felt sad or miserable. 0  I have been so unhappy that I have been crying. 0  The thought of harming myself has occurred to me. 0  Edinburgh Postnatal Depression Scale Total 4     After visit meds:  Allergies as of 07/11/2022       Reactions   Latex Itching, Rash   Asa [aspirin] Nausea And Vomiting     Med Rec must be completed prior to using this [facility]***        Discharge home in stable condition Infant Feeding: {Baby feeding:23562} Infant Disposition:{CHL IP OB HOME WITH ZHYQMV:78469} Discharge instruction: per After Visit Summary and Postpartum booklet. Activity: Advance as tolerated. Pelvic rest for 6 weeks.  Diet: {OB  GEXB:28413244} Future Appointments: Future Appointments  Date Time Provider Department Center  07/18/2022 10:10 AM [facility] NURSE [facility]   Follow up Visit: Message sent   Please schedule this patient for a In person postpartum visit in 6 weeks with the following provider: Any provider. Additional Postpartum F/U:Incision check 1 week and BP check 1 week  High risk pregnancy complicated by: HTN Delivery mode:  Vaginal, Spontaneous  Anticipated Birth Control:  BTL done [facility]   07/11/2022 Celedonio Savage, MD

## 2022-07-11 NOTE — Progress Notes (Signed)
Labor Progress Note MENDA LEFFALL is a 32 y.o. G3P1011 at [redacted]w[redacted]d presented for IOL 2/2 CHTN.  S: Doing well, no concerns. Not feeling her contractions.  O:  BP 120/65   Pulse 69   Temp 97.7 F (36.5 C) (Oral)   Resp 18   Ht 5\' 2"  (1.575 m)   Wt 73.5 kg   LMP 10/17/2021 (Approximate)   SpO2 100%   BMI 29.63 kg/m   CVE: Dilation: 4.5 Effacement (%): 60, 70 Station: -2 Presentation: Vertex Exam by:: Lorn Junes, RNC   A&P: 32 y.o. G3P1011 [redacted]w[redacted]d here for IOL 2/2 CHTN.   #Labor: Progressing well. Started pitocin @ 0830. Plan for cervical check ~1230.  #Pain: Controlled. #FWB: Cat II (130 BPM, Mod variability, reactive, intermittent early decels). #GBS positive on PCN #CHTN: Normotensive  Tiffany Kocher, DO 10:18 AM

## 2022-07-11 NOTE — H&P (Signed)
Allison Prince is a 32 y.o. female G3P1011 with IUP at [redacted]w[redacted]d presenting for IOL for CHTN, no meds. [facility]  Prenatal History/Complications:   Term SVD w/o problems  Past Medical History: Past Medical History:  Diagnosis Date   Allergy    Fibroid    Hypertension    Vaginal Pap smear, abnormal     Past Surgical History: Past Surgical History:  Procedure Laterality Date   NO PAST SURGERIES     tooth pulled      Obstetrical History: OB History     Gravida  3   Para  1   Term  1   Preterm      AB  1   Living  1      SAB      IAB      Ectopic  1   Multiple  0   Live Births  1           Social History: Social History   Socioeconomic History   Marital status: Single    Spouse name: Not on file   Number of children: 1   Years of education: Not on file   Highest education level: Not on file  Occupational History   Not on file  Tobacco Use   Smoking status: Every Day    Packs/day: 0.25    Years: 6.00    Additional pack years: 0.00    Total pack years: 1.50    Types: Cigarettes   Smokeless tobacco: Never   Tobacco comments:    1-2 a day  Vaping Use   Vaping Use: Never used  Substance and Sexual Activity   Alcohol use: Not Currently    Alcohol/week: 2.0 standard drinks of alcohol    Types: 2 Shots of liquor per week    Comment: occ   Drug use: No   Sexual activity: Yes    Birth control/protection: None  Other Topics Concern   Not on file  Social History Narrative   Not on file   Social Determinants of Health   Financial Resource Strain: Low Risk  (01/06/2022)   Overall Financial Resource Strain (CARDIA)    Difficulty of Paying Living Expenses: Not very hard  Food Insecurity: No Food Insecurity (01/06/2022)   Hunger Vital Sign    Worried About Running Out of Food in the Last Year: Never true    Ran Out of Food in the Last Year: Never true  Transportation Needs: No Transportation Needs (11/22/2020)   PRAPARE -  Administrator, Civil Service (Medical): No    Lack of Transportation (Non-Medical): No  Physical Activity: Insufficiently Active (01/06/2022)   Exercise Vital Sign    Days of Exercise per Week: 2 days    Minutes of Exercise per Session: 20 min  Stress: Stress Concern Present (01/06/2022)   [employer] of Occupational Health - Occupational Stress Questionnaire    Feeling of Stress : To some extent  Social Connections: Moderately Isolated (01/06/2022)   Social Connection and Isolation Panel [NHANES]    Frequency of Communication with Friends and Family: More than three times a week    Frequency of Social Gatherings with Friends and Family: More than three times a week    Attends Religious Services: 1 to 4 times per year    Active Member of [employer] or Organizations: No    Attends Banker Meetings: Never    Marital Status: Never married    Family  History: Family History  Problem Relation Age of Onset   COPD Mother    Depression Mother    Diabetes Mother    Hyperlipidemia Mother    Hypertension Mother    Asthma Brother    Arthritis Paternal Grandfather     Allergies: Allergies  Allergen Reactions   Latex Itching and Rash   Asa [Aspirin] Nausea And Vomiting    Medications Prior to Admission  Medication Sig Dispense Refill Last Dose   labetalol (NORMODYNE) 200 MG tablet Take 1 tablet (200 mg total) by mouth 2 (two) times daily. (Patient not taking: Reported on 07/10/2022) 60 tablet 3    Prenatal 27-1 MG TABS Take 1 tablet by mouth at bedtime. 30 tablet 12         Review of Systems   Constitutional: Negative for fever and chills Eyes: Negative for visual disturbances Respiratory: Negative for shortness of breath, dyspnea Cardiovascular: Negative for chest pain or palpitations  Gastrointestinal: Negative for abdominal pain, vomiting, diarrhea and constipation.   Genitourinary: Negative for dysuria and urgency Musculoskeletal: Negative for  back pain, joint pain, myalgias  Neurological: Negative for dizziness and headaches      Last menstrual period 10/17/2021. General appearance: alert, cooperative, and no distress Lungs: normal respiratory effort Heart: regular rate and rhythm Abdomen: soft, non-tender; bowel sounds normal Extremities: Homans sign is negative, no sign of DVT DTR's 2+ Presentation: cephalic Fetal monitoring  Baseline: 150 bpm, Variability: Good {> 6 bpm), Accelerations: Reactive, and Decelerations: Absent Uterine activity  mild and irregular ctx      Prenatal labs: ABO, Rh: --/--/O POS (06/14 0016) Antibody: NEG (06/14 0016) Rubella: immune RPR: Non Reactive (04/10 0836)  HBsAg: Negative (12/11 1628)  HIV: Non Reactive (04/10 0836)  GBS: Positive/-- (05/30 1110)      FAMILY TREE   RESULTS  Language English Pap 11/22/20 neg  Initiated care at 10 weeks GC/CT Initial:        -/-    36wks:  Dating by LMP c/w 10wk [place]      Support person   Genetics NT/IT:     AFP:      Panorama: low risk girl  BP cuff yes Carrier Screen neg      [place]/Hgb Elec neg  Rhogam N/a      TDaP vaccine   Blood Type O/Positive/-- (12/11 1628)  Flu vaccine   Antibody Negative (04/10 0836)  Covid vaccine   HBsAg Negative (12/11 1628)      RPR Non Reactive (04/10 0836)  Anatomy US Normal girl "Ah-lay-ah" Rubella  10.10 (12/11 1628)  Feeding Plan unsure HIV Non Reactive (04/10 0836)  Contraception Nex v BTL Hep C neg  Circumcision N/a      Pediatrician [facility] A1C/GTT Early:      26-28wks: normal  Prenatal Classes            GBS Positive/-- (05/30 1110) positive [ ]  PCN allergy  BTL Consent obtained      VBAC Consent N/a PHQ9 & GAD7  [ ] New OB  [ ] 28wks   [ ] 36wks  Waterbirth [ ] Class [ ]  36wkCNM visit/consent           Prenatal Transfer Tool  Maternal Diabetes: No Genetic Screening: Normal Maternal Ultrasounds/Referrals: Normal Fetal Ultrasounds or other Referrals:  None Maternal Substance Abuse:   No Significant Maternal Medications:  None Significant Maternal Lab Results: Group B Strep positive    Results for orders placed or performed during the hospital encounter of  07/11/22 (from the past 24 hour(s))  CBC   Collection Time: 07/11/22 12:14 AM  Result Value Ref Range   WBC 8.4 4.0 - 10.5 K/uL   RBC 3.99 3.87 - 5.11 MIL/uL   Hemoglobin 11.9 (L) 12.0 - 15.0 g/dL   HCT 21.3 (L) 08.6 - 57.8 %   MCV 87.7 80.0 - 100.0 fL   MCH 29.8 26.0 - 34.0 pg   MCHC 34.0 30.0 - 36.0 g/dL   RDW 46.9 62.9 - 52.8 %   Platelets 330 150 - 400 K/uL   nRBC 0.0 0.0 - 0.2 %  Type and screen [facility]   Collection Time: 07/11/22 12:16 AM  Result Value Ref Range   ABO/RH(D) O POS    Antibody Screen NEG    Sample Expiration      07/14/2022,2359 Performed at [facility], [address] [place], [place] [address]   Protein / creatinine ratio, urine   Collection Time: 07/11/22 12:18 AM  Result Value Ref Range   Creatinine, Urine 322 mg/dL   Total Protein, Urine 38 mg/dL   Protein Creatinine Ratio 0.12 0.00 - 0.15 mg/mg[Cre]    Assessment: Allison Prince is a 32 y.o. G3P1011 with an IUP at [redacted]w[redacted]d presenting for IOL for CHTN.  Plan: #Labor: Cooks catheter inserted and inflated w/50cc H20.  Will start Cytotec-in 2 hours (loading dose of PCN infusing)>plan AROM and pitocin prn #Pain:  Per request #FWB Cat 1  Jacklyn Shell 07/11/2022, 1:34 AM

## 2022-07-12 ENCOUNTER — Encounter (HOSPITAL_COMMUNITY)
Admission: RE | Disposition: A | Discharge status: Home / Self Care | Payer: Self-pay | Source: Home / Self Care | Attending: Obstetrics and Gynecology

## 2022-07-12 ENCOUNTER — Inpatient Hospital Stay (HOSPITAL_COMMUNITY): Payer: BC Managed Care – PPO | Admitting: Anesthesiology

## 2022-07-12 ENCOUNTER — Encounter (HOSPITAL_COMMUNITY): Payer: Self-pay | Admitting: Obstetrics & Gynecology

## 2022-07-12 DIAGNOSIS — Z302 Encounter for sterilization: Secondary | ICD-10-CM

## 2022-07-12 HISTORY — PX: TUBAL LIGATION: SHX77

## 2022-07-12 LAB — CBC
HCT: 31.4 % — ABNORMAL LOW (ref 36.0–46.0)
Hemoglobin: 10.7 g/dL — ABNORMAL LOW (ref 12.0–15.0)
MCH: 29 pg (ref 26.0–34.0)
MCHC: 34.1 g/dL (ref 30.0–36.0)
MCV: 85.1 fL (ref 80.0–100.0)
Platelets: 290 10*3/uL (ref 150–400)
RBC: 3.69 MIL/uL — ABNORMAL LOW (ref 3.87–5.11)
RDW: 13.9 % (ref 11.5–15.5)
WBC: 14.2 10*3/uL — ABNORMAL HIGH (ref 4.0–10.5)
nRBC: 0 % (ref 0.0–0.2)

## 2022-07-12 SURGERY — LIGATION, FALLOPIAN TUBE, POSTPARTUM
Anesthesia: Epidural | Laterality: Bilateral

## 2022-07-12 MED ORDER — FENTANYL CITRATE (PF) 100 MCG/2ML IJ SOLN
INTRAMUSCULAR | Status: DC | PRN
  Administered 2022-07-12 (×2): 50 ug via INTRAVENOUS

## 2022-07-12 MED ORDER — LACTATED RINGERS IV SOLN: INTRAVENOUS | Status: DC

## 2022-07-12 MED ORDER — FENTANYL CITRATE (PF) 100 MCG/2ML IJ SOLN
INTRAMUSCULAR | Status: AC
  Filled 2022-07-12: qty 2

## 2022-07-12 MED ORDER — ONDANSETRON HCL 4 MG/2ML IJ SOLN
INTRAMUSCULAR | Status: DC | PRN
  Administered 2022-07-12: 4 mg via INTRAVENOUS

## 2022-07-12 MED ORDER — LIDOCAINE-EPINEPHRINE (PF) 2 %-1:200000 IJ SOLN
INTRAMUSCULAR | Status: DC | PRN
  Administered 2022-07-12 (×4): 5 mL via EPIDURAL

## 2022-07-12 MED ORDER — METOCLOPRAMIDE HCL 10 MG PO TABS
10.0000 mg | ORAL_TABLET | Freq: Once | ORAL | Status: AC
  Administered 2022-07-12: 10 mg via ORAL
  Filled 2022-07-12: qty 1

## 2022-07-12 MED ORDER — LIDOCAINE-EPINEPHRINE (PF) 2 %-1:200000 IJ SOLN
INTRAMUSCULAR | Status: AC
  Filled 2022-07-12: qty 20

## 2022-07-12 MED ORDER — SODIUM CHLORIDE 0.9 % IR SOLN
Status: DC | PRN
  Administered 2022-07-12: 1

## 2022-07-12 MED ORDER — ACETAMINOPHEN 10 MG/ML IV SOLN
INTRAVENOUS | Status: AC
  Filled 2022-07-12: qty 100

## 2022-07-12 MED ORDER — FAMOTIDINE 20 MG PO TABS
40.0000 mg | ORAL_TABLET | Freq: Once | ORAL | Status: AC
  Administered 2022-07-12: 40 mg via ORAL
  Filled 2022-07-12: qty 2

## 2022-07-12 MED ORDER — STERILE WATER FOR IRRIGATION IR SOLN
Status: DC | PRN
  Administered 2022-07-12: 1

## 2022-07-12 MED ORDER — BUPIVACAINE HCL (PF) 0.25 % IJ SOLN
INTRAMUSCULAR | Status: AC
  Filled 2022-07-12: qty 30

## 2022-07-12 MED ORDER — ONDANSETRON HCL 4 MG/2ML IJ SOLN
INTRAMUSCULAR | Status: AC
  Filled 2022-07-12: qty 2

## 2022-07-12 MED ORDER — BUPIVACAINE HCL (PF) 0.25 % IJ SOLN
INTRAMUSCULAR | Status: DC | PRN
  Administered 2022-07-12: 10 mL

## 2022-07-12 MED ORDER — FENTANYL CITRATE (PF) 100 MCG/2ML IJ SOLN
INTRAMUSCULAR | Status: DC | PRN
  Administered 2022-07-12: 100 ug via EPIDURAL

## 2022-07-12 MED ORDER — ACETAMINOPHEN 10 MG/ML IV SOLN
INTRAVENOUS | Status: DC | PRN
  Administered 2022-07-12: 1000 mg via INTRAVENOUS

## 2022-07-12 MED ORDER — LIDOCAINE-EPINEPHRINE (PF) 2 %-1:200000 IJ SOLN
INTRAMUSCULAR | Status: AC
  Filled 2022-07-12: qty 40

## 2022-07-12 SURGICAL SUPPLY — 20 items
BLADE SURG 11 STRL SS (BLADE) ×1 IMPLANT
CLIP FILSHIE TUBAL LIGA STRL (Clip) ×1 IMPLANT
CLOTH BEACON ORANGE TIMEOUT ST (SAFETY) ×1 IMPLANT
DRSG OPSITE POSTOP 3X4 (GAUZE/BANDAGES/DRESSINGS) ×1 IMPLANT
DURAPREP 26ML APPLICATOR (WOUND CARE) ×1 IMPLANT
GLOVE BIO SURGEON STRL SZ 6.5 (GLOVE) ×1 IMPLANT
GLOVE BIOGEL PI IND STRL 7.0 (GLOVE) ×2 IMPLANT
GOWN STRL REUS W/TWL LRG LVL3 (GOWN DISPOSABLE) ×2 IMPLANT
NEEDLE HYPO 22GX1.5 SAFETY (NEEDLE) ×1 IMPLANT
NS IRRIG 1000ML POUR BTL (IV SOLUTION) ×1 IMPLANT
PACK ABDOMINAL MINOR (CUSTOM PROCEDURE TRAY) ×1 IMPLANT
PROTECTOR NERVE ULNAR (MISCELLANEOUS) ×1 IMPLANT
SPONGE LAP 4X18 RFD (DISPOSABLE) IMPLANT
SUT VIC AB 0 CT1 27 (SUTURE) ×2
SUT VIC AB 0 CT1 27XBRD ANBCTR (SUTURE) ×1 IMPLANT
SUT VICRYL 4-0 PS2 18IN ABS (SUTURE) ×1 IMPLANT
SYR CONTROL 10ML LL (SYRINGE) ×1 IMPLANT
TOWEL OR 17X24 6PK STRL BLUE (TOWEL DISPOSABLE) ×2 IMPLANT
TRAY FOLEY CATH SILVER 16FR (SET/KITS/TRAYS/PACK) ×1 IMPLANT
WATER STERILE IRR 1000ML POUR (IV SOLUTION) ×1 IMPLANT

## 2022-07-12 NOTE — Progress Notes (Signed)
32 y.o. [id number] with undesired fertility,status post vaginal delivery, desires permanent sterilization. Risks and benefits of procedure discussed with patient including permanence of method, bleeding, infection, injury to surrounding organs and need for additional procedures. Use of clip vs. Salpingectomy pro and con discussed and accepts Filshie clips. Risk failure of 0.5-1% with increased risk of ectopic gestation if pregnancy occurs was also discussed with patient. Patient verbalized understanding and all questions were answered.  Currie Paris Debroah Loop MD 07/12/2022 8:34 AM    Patient ID: Hoover Brunette, female   DOB: 01-12-1991, 32 y.o.   MRN: [id number]

## 2022-07-12 NOTE — Anesthesia Postprocedure Evaluation (Signed)
Anesthesia Post Note  Patient: Allison Prince  Procedure(s) Performed: POST PARTUM TUBAL LIGATION (Bilateral)     Patient location during evaluation: Mother Baby Anesthesia Type: Epidural Level of consciousness: awake and alert Pain management: pain level controlled Vital Signs Assessment: post-procedure vital signs reviewed and stable Respiratory status: spontaneous breathing, nonlabored ventilation and respiratory function stable Cardiovascular status: stable Postop Assessment: no headache, no backache and epidural receding Anesthetic complications: no   No notable events documented.  Last Vitals:  Vitals:   07/12/22 0400 07/12/22 0723  BP: 114/73 108/78  Pulse: 68 (!) 58  Resp: 18 17  Temp: 36.9 C 36.7 C  SpO2: 100% 100%    Last Pain:  Vitals:   07/12/22 0723  TempSrc: Oral  PainSc: 0-No pain   Pain Goal: Patients Stated Pain Goal: 1 (07/11/22 0415)              Epidural/Spinal Function Cutaneous sensation: Normal sensation (07/12/22 0723), Patient able to flex knees: Yes (07/12/22 0723), Patient able to lift hips off bed: Yes (07/12/22 0723), Back pain beyond tenderness at insertion site: No (07/12/22 0723), Progressively worsening motor and/or sensory loss: No (07/12/22 0723), Bowel and/or bladder incontinence post epidural: No (07/12/22 0723)  Rica Records

## 2022-07-12 NOTE — Op Note (Signed)
Allison Prince 07/12/2022  PREOPERATIVE DIAGNOSIS:  Multiparity, undesired fertility  POSTOPERATIVE DIAGNOSIS:  Multiparity, undesired fertility  PROCEDURE:  Postpartum Bilateral Tubal Sterilization using Filshie Clips   ANESTHESIA:  Epidural  COMPLICATIONS:  None immediate.  ESTIMATED BLOOD LOSS:  Less than 20 ml.  FLUIDS: 500 ml LR.  URINE OUTPUT:  100 ml of clear urine.  INDICATIONS: 32 y.o. [id number]  with undesired fertility,status post vaginal delivery, desires permanent sterilization. Risks and benefits of procedure discussed with patient including permanence of method, bleeding, infection, injury to surrounding organs and need for additional procedures. Risk failure of 0.5-1% with increased risk of ectopic gestation if pregnancy occurs was also discussed with patient.   FINDINGS:  Normal uterus, tubes, and ovaries.  TECHNIQUE:  The patient was taken to the operating room where her epidural anesthesia was dosed up to surgical level and found to be adequate.  She was then placed in the dorsal supine position and prepped and draped in sterile fashion.  After an adequate timeout was performed, attention was turned to the patient's abdomen where a small transverse skin incision was made under the umbilical fold. The incision was taken down to the layer of fascia using the scalpel, and fascia was incised, and extended bilaterally using Mayo scissors. The peritoneum was entered in a sharp fashion. Attention was then turned to the patient's uterus, and left fallopian tube was identified and followed out to the fimbriated end.  A Filshie clip was placed on the left fallopian tube about 2 cm from the cornual attachment, with care given to incorporate the underlying mesosalpinx.  A similar process was carried out on the right side allowing for bilateral tubal sterilization.  Good hemostasis was noted overall.  Local analgesia was drizzled on both operative sites.The instruments were then removed  from the patient's abdomen and the fascial incision was repaired with 0 Vicryl, and the skin was closed with a 4-0 Vicryl subcuticular stitch. The patient tolerated the procedure well.  Sponge, lap, and needle counts were correct times two.  The patient was then taken to the recovery room awake, extubated and in stable condition.  Scheryl Darter MD 07/12/2022 9:58 AM

## 2022-07-12 NOTE — Anesthesia Preprocedure Evaluation (Signed)
Anesthesia Evaluation  Patient identified by MRN, date of birth, ID band Patient awake    Reviewed: Allergy & Precautions, NPO status , Patient's Chart, lab work & pertinent test results  History of Anesthesia Complications Negative for: history of anesthetic complications  Airway Mallampati: III  TM Distance: >3 FB Neck ROM: Full    Dental   Pulmonary Current Smoker   Pulmonary exam normal breath sounds clear to auscultation       Cardiovascular hypertension, Pt. on home beta blockers  Rhythm:Regular Rate:Normal     Neuro/Psych negative neurological ROS     GI/Hepatic negative GI ROS, Neg liver ROS,,,  Endo/Other  negative endocrine ROS    Renal/GU negative Renal ROS     Musculoskeletal   Abdominal   Peds  Hematology negative hematology ROS (+)   Anesthesia Other Findings Postpartum day 1  Reproductive/Obstetrics                              Anesthesia Physical Anesthesia Plan  ASA: 2  Anesthesia Plan: Epidural   Post-op Pain Management:    Induction:   PONV Risk Score and Plan:   Airway Management Planned: Natural Airway  Additional Equipment:   Intra-op Plan:   Post-operative Plan:   Informed Consent: I have reviewed the patients History and Physical, chart, labs and discussed the procedure including the risks, benefits and alternatives for the proposed anesthesia with the patient or authorized representative who has indicated his/her understanding and acceptance.       Plan Discussed with: Anesthesiologist  Anesthesia Plan Comments: (Patient has labor epidural in place. Will plan to dose labor epidural for c-section with backup plan of GA. Patient in agreement with plan. )         Anesthesia Quick Evaluation

## 2022-07-12 NOTE — Anesthesia Postprocedure Evaluation (Signed)
Anesthesia Post Note  Patient: Allison Prince  Procedure(s) Performed: POST PARTUM TUBAL LIGATION (Bilateral)     Patient location during evaluation: PACU Anesthesia Type: Epidural Level of consciousness: awake Pain management: pain level controlled Vital Signs Assessment: post-procedure vital signs reviewed and stable Respiratory status: spontaneous breathing, nonlabored ventilation and respiratory function stable Cardiovascular status: stable Postop Assessment: no headache, no backache and epidural receding Anesthetic complications: no   No notable events documented.  Last Vitals:  Vitals:   07/12/22 1030 07/12/22 1045  BP: 102/62 102/72  Pulse: 66 65  Resp: 11   Temp:    SpO2: 99% 99%    Last Pain:  Vitals:   07/12/22 1045  TempSrc:   PainSc: 0-No pain   Pain Goal: Patients Stated Pain Goal: 1 (07/11/22 0415)  LLE Motor Response: Purposeful movement (07/12/22 1045) LLE Sensation: Increased (07/12/22 1045) RLE Motor Response: Purposeful movement (07/12/22 1045) RLE Sensation: Increased (07/12/22 1045) L Sensory Level: T10-Umbilical region (07/12/22 1045) R Sensory Level: T10-Umbilical region (07/12/22 1045) Epidural/Spinal Function Cutaneous sensation: Able to Wiggle Toes (07/12/22 1045), Patient able to flex knees: Yes (07/12/22 1045), Patient able to lift hips off bed: Yes (07/12/22 1045), Back pain beyond tenderness at insertion site: No (07/12/22 1045), Progressively worsening motor and/or sensory loss: No (07/12/22 1045), Bowel and/or bladder incontinence post epidural: No (07/12/22 1045)  Linton Rump

## 2022-07-12 NOTE — Progress Notes (Addendum)
Post Partum Day 1 Subjective: no complaints, up ad lib, voiding, tolerating PO, and + flatus.   Objective: Blood pressure 108/78, pulse (!) 58, temperature 98.1 F (36.7 C), temperature source Oral, resp. rate 17, height 5\' 2"  (1.575 m), weight 73.5 kg, last menstrual period 10/17/2021, SpO2 100 %, unknown if currently breastfeeding.  Physical Exam:  General: alert, cooperative, and no distress Lochia: appropriate Uterine Fundus: firm DVT Evaluation: No evidence of DVT seen on physical exam. No significant calf/ankle edema.  Recent Labs    07/11/22 0721 07/12/22 0322  HGB 10.9* 10.7*  HCT 32.1* 31.4*    Assessment/Plan:  PPD #1: Doing well.  -Routine postpartum care  Contraception: BTL scheduled today, consent obtained. CHTN: Normotensive pressures. Continue Lasix Feeding: Bottle   LOS: 1 day   Tiffany Kocher, DO 07/12/2022, 8:19 AM   Fellow Attestation  I saw and evaluated the patient, performing the key elements of the service.I  personally performed or re-performed the history, physical exam, and medical decision making activities of this service and have verified that the service and findings are accurately documented in the resident's note. I developed the management plan that is described in the resident's note, and I agree with the content, with my edits above.    Derrel Nip, MD OB Fellow

## 2022-07-12 NOTE — Transfer of Care (Signed)
Immediate Anesthesia Transfer of Care Note  Patient: Allison Prince  Procedure(s) Performed: POST PARTUM TUBAL LIGATION (Bilateral)  Patient Location: PACU  Anesthesia Type:Epidural  Level of Consciousness: awake, alert , and oriented  Airway & Oxygen Therapy: Patient Spontanous Breathing  Post-op Assessment: Report given to RN and Post -op Vital signs reviewed and stable  Post vital signs: Reviewed and stable  Last Vitals:  Vitals Value Taken Time  BP 102/70 07/12/22 0945  Temp    Pulse 88 07/12/22 0946  Resp 13 07/12/22 0946  SpO2 99 % 07/12/22 0946  Vitals shown include unvalidated device data.  Last Pain:  Vitals:   07/12/22 0723  TempSrc: Oral  PainSc: 0-No pain      Patients Stated Pain Goal: 1 (07/11/22 0415)  Complications: No notable events documented.

## 2022-07-13 DIAGNOSIS — Z9851 Tubal ligation status: Secondary | ICD-10-CM

## 2022-07-13 MED ORDER — FUROSEMIDE 20 MG PO TABS: 20.0000 mg | ORAL_TABLET | Freq: Every day | ORAL | 0 refills | Status: DC

## 2022-07-13 MED ORDER — OXYCODONE HCL 5 MG PO TABS: 5.0000 mg | ORAL_TABLET | ORAL | 0 refills | Status: DC | PRN

## 2022-07-13 MED ORDER — NIFEDIPINE ER 60 MG PO TB24: 60.0000 mg | ORAL_TABLET | Freq: Every day | ORAL | 3 refills | Status: DC

## 2022-07-13 MED ORDER — IBUPROFEN 600 MG PO TABS: 600.0000 mg | ORAL_TABLET | Freq: Four times a day (QID) | ORAL | 0 refills | Status: DC

## 2022-07-13 MED ORDER — NIFEDIPINE ER OSMOTIC RELEASE 30 MG PO TB24
30.0000 mg | ORAL_TABLET | Freq: Every day | ORAL | Status: DC
  Administered 2022-07-13: 30 mg via ORAL
  Filled 2022-07-13: qty 1

## 2022-07-13 MED ORDER — NIFEDIPINE ER 30 MG PO TB24: 30.0000 mg | ORAL_TABLET | Freq: Every day | ORAL | 3 refills | Status: DC

## 2022-07-13 MED ORDER — ACETAMINOPHEN 325 MG PO TABS: 650.0000 mg | ORAL_TABLET | ORAL | 0 refills | Status: AC | PRN

## 2022-07-13 NOTE — Anesthesia Postprocedure Evaluation (Signed)
Anesthesia Post Note  Patient: Allison Prince  Procedure(s) Performed: AN AD HOC LABOR EPIDURAL     Patient location during evaluation: Mother Baby Anesthesia Type: Epidural Level of consciousness: awake, oriented and awake and alert Pain management: pain level controlled Vital Signs Assessment: post-procedure vital signs reviewed and stable Respiratory status: spontaneous breathing, respiratory function stable and nonlabored ventilation Cardiovascular status: stable Postop Assessment: adequate PO intake, no headache, able to ambulate, patient able to bend at knees and no apparent nausea or vomiting Anesthetic complications: no   No notable events documented.  Last Vitals:  Vitals:   07/13/22 0500 07/13/22 0600  BP: (!) 134/90 (!) 135/92  Pulse: 65 72  Resp: 18   Temp: 36.7 C   SpO2: 100%     Last Pain:  Vitals:   07/13/22 0832  TempSrc:   PainSc: 6    Pain Goal: Patients Stated Pain Goal: 1 (07/11/22 0415)                 Truitt Leep

## 2022-07-13 NOTE — Progress Notes (Signed)
Notified [employer] Midwife of technical issues with Postpartum Hypertension App for MOB iphone. Plan to call BP's in to [facility] until Baby[employer] app is fixed. Patient has BP monitoring device at home. And is agreeable. Discharge education complete.

## 2022-07-16 ENCOUNTER — Encounter: Payer: Self-pay | Admitting: *Deleted

## 2022-07-17 ENCOUNTER — Encounter: Payer: BC Managed Care – PPO | Admitting: Obstetrics & Gynecology

## 2022-07-17 ENCOUNTER — Other Ambulatory Visit: Payer: BC Managed Care – PPO

## 2022-07-18 ENCOUNTER — Telehealth (INDEPENDENT_AMBULATORY_CARE_PROVIDER_SITE_OTHER): Payer: BC Managed Care – PPO | Admitting: *Deleted

## 2022-07-18 VITALS — BP 143/90 | HR 80

## 2022-07-18 DIAGNOSIS — I159 Secondary hypertension, unspecified: Secondary | ICD-10-CM

## 2022-07-18 DIAGNOSIS — Z9889 Other specified postprocedural states: Secondary | ICD-10-CM

## 2022-07-18 NOTE — Progress Notes (Signed)
   NURSE VISIT- BLOOD PRESSURE CHECK  I connected with Hoover Brunette on 07/18/2022 by MyChart video and verified that I am speaking with the correct person using two identifiers.   I discussed the limitations of evaluation and management by telemedicine. The patient expressed understanding and agreed to proceed.  Nurse is at the office, and patient is at home.  SUBJECTIVE:  Allison Prince is a 32 y.o. G55P2012 female here for BP check. She is postpartum, delivery date 07/11/22     HYPERTENSION ROS:  Postpartum:  Severe headaches that don't go away with tylenol/other medicines: No  Visual changes (seeing spots/double/blurred vision) No  Severe pain under right breast breast or in center of upper chest No  Severe nausea/vomiting No  Taking medicines as instructed yes   OBJECTIVE:  BP (!) 143/90 (BP Location: Right Arm, Patient Position: Sitting, Cuff Size: Normal)   Pulse 80   LMP 10/17/2021 (Approximate)   Breastfeeding No   Appearance alert, well appearing, and in no distress.  ASSESSMENT: Postpartum  blood pressure check  PLAN: Discussed with Dr. Despina Hidden   Recommendations: no changes needed   Follow-up: as scheduled   Jobe Marker  07/18/2022 10:22 AM

## 2022-08-15 ENCOUNTER — Encounter: Payer: Self-pay | Admitting: Obstetrics and Gynecology

## 2022-08-15 ENCOUNTER — Ambulatory Visit (INDEPENDENT_AMBULATORY_CARE_PROVIDER_SITE_OTHER): Payer: BC Managed Care – PPO | Admitting: Obstetrics and Gynecology

## 2022-08-15 DIAGNOSIS — N979 Female infertility, unspecified: Secondary | ICD-10-CM

## 2022-08-15 DIAGNOSIS — I10 Essential (primary) hypertension: Secondary | ICD-10-CM | POA: Diagnosis not present

## 2022-08-15 DIAGNOSIS — Z9851 Tubal ligation status: Secondary | ICD-10-CM

## 2022-08-15 NOTE — Progress Notes (Signed)
Post Partum Visit Note  Allison Prince is a 32 y.o. G34P2012 female who presents for a postpartum visit. She is 5 weeks postpartum following a normal spontaneous vaginal delivery.  I have fully reviewed the prenatal and intrapartum course. The delivery was at [redacted]w[redacted]d gestational weeks.  Anesthesia: epidural. Postpartum course has been good. Baby is doing well. Baby is feeding by bottle -   . Bleeding no bleeding. Bowel function is normal. Bladder function is normal. Patient is not sexually active. Contraception method is tubal ligation. Postpartum depression screening: negative.   Upstream - 08/15/22 0943       Pregnancy Intention Screening   Does the patient want to become pregnant in the next year? No    Does the patient's partner want to become pregnant in the next year? No    Would the patient like to discuss contraceptive options today? No      Contraception Wrap Up   Current Method Female Sterilization    End Method Female Sterilization    Contraception Counseling Provided No            The pregnancy intention screening data noted above was reviewed. Potential methods of contraception were discussed. The patient elected to proceed with, had a  Female Sterilization.   Edinburgh Postnatal Depression Scale - 08/15/22 0951       Edinburgh Postnatal Depression Scale:  In the Past 7 Days   I have been able to laugh and see the funny side of things. 1    I have looked forward with enjoyment to things. 0    I have blamed myself unnecessarily when things went wrong. 1    I have been anxious or worried for no good reason. 1    I have felt scared or panicky for no good reason. 1    Things have been getting on top of me. 1    I have been so unhappy that I have had difficulty sleeping. 0    I have felt sad or miserable. 0    I have been so unhappy that I have been crying. 0    The thought of harming myself has occurred to me. 0    Edinburgh Postnatal Depression Scale Total 5              Health Maintenance Due  Topic Date Due   COVID-19 Vaccine (1 - 2023-24 season) Never done    The following portions of the patient's history were reviewed and updated as appropriate: allergies, current medications, past family history, past medical history, past social history, past surgical history, and problem list.  Review of Systems Pertinent items are noted in HPI.  Objective:  BP 125/89 (BP Location: Right Arm, Patient Position: Sitting, Cuff Size: Normal)   Pulse 76   LMP 10/17/2021 (Approximate)    General:  alert and cooperative   Breasts:  not indicated  Lungs: Normal rate  Heart:  Normal effort  Abdomen: Non-distended    Wound N/a  GU exam:  not indicated       Assessment:   1. Postpartum examination following vaginal delivery   2. Chronic hypertension Was on amlodipine prior to pregnancy. Normotensive on nifedipine 60 mg. Continue current dose, follow up with PCP   3. Female sterility S/p tubal with filshie clips per OP note   Plan:   Essential components of care per ACOG recommendations:  1.  Mood and well being: Patient with negative depression screening today. Reviewed local resources  for support.  - Patient tobacco use? Yes. Patient desires to quit? No.   - hx of drug use? No.    2. Infant care and feeding:  -Patient currently breastmilk feeding? Yes. Reviewed importance of draining breast regularly to support lactation.  -Social determinants of health (SDOH) reviewed in EPIC. No concerns  3. Sexuality, contraception and birth spacing - Patient does not want a pregnancy in the next year.  Desired family size is 2 children.  - Reviewed reproductive life planning. Reviewed contraceptive methods based on pt preferences and effectiveness.  Patient desired Female Sterilization today.   - Discussed birth spacing of 18 months  4. Sleep and fatigue -Encouraged family/partner/community support of 4 hrs of uninterrupted sleep to help with  mood and fatigue  5. Physical Recovery  - Discussed patients delivery and complications. She describes her labor as good. - Patient had a Vaginal, no problems at delivery. Patient had a  periurethral  laceration, not repaired. Perineal healing reviewed. Patient expressed understanding - Patient has urinary incontinence? No. - Patient is safe to resume physical and sexual activity  6.  Health Maintenance - HM due items addressed No - n/a - Last pap smear  Diagnosis  Date Value Ref Range Status  11/22/2020   Final   - Negative for Intraepithelial Lesions or Malignancy (NILM)  11/22/2020 - Benign reactive/reparative changes  Final   Pap smear not done at today's visit.  -Breast Cancer screening indicated? No.   7. Chronic Disease/Pregnancy Condition follow up: Hypertension  - PCP follow up  Albertine Grates, FNP [facility], [facility]

## 2023-12-07 ENCOUNTER — Other Ambulatory Visit: Payer: Self-pay | Admitting: Family Medicine

## 2024-02-12 ENCOUNTER — Ambulatory Visit

## 2024-02-12 VITALS — BP 220/120 | HR 90 | Temp 97.8°F | Ht 62.0 in | Wt 152.2 lb

## 2024-02-12 DIAGNOSIS — I10 Essential (primary) hypertension: Secondary | ICD-10-CM

## 2024-02-12 DIAGNOSIS — Z720 Tobacco use: Secondary | ICD-10-CM | POA: Diagnosis not present

## 2024-02-12 DIAGNOSIS — Z13220 Encounter for screening for lipoid disorders: Secondary | ICD-10-CM

## 2024-02-12 MED ORDER — VARENICLINE TARTRATE (STARTER) 0.5 MG X 11 & 1 MG X 42 PO TBPK: ORAL_TABLET | ORAL | 0 refills | Status: AC

## 2024-02-12 MED ORDER — AMLODIPINE BESYLATE 5 MG PO TABS: 5.0000 mg | ORAL_TABLET | Freq: Every day | ORAL | 0 refills | Status: DC

## 2024-02-12 NOTE — Assessment & Plan Note (Signed)
-   Continues smoking half a pack daily. Smoking cessation advised to aid blood pressure control. Spent 5 minutes counseling for smoking cession. Discussed Chantix  for craving reduction. - Initiated Chantix , titrated dose as tolerated.

## 2024-02-12 NOTE — Assessment & Plan Note (Signed)
-  Hypertension uncontrolled with nifedipine . Home systolic readings ~185 mmHg, current 210/146 mmHg. Weekly headaches likely due to hypertension. Smoking may contribute. - Discontinued nifedipine . - Initiated amlodipine .  - Advised daily home blood pressure monitoring. Gave target blood pressure of less than 130/80.  - Ordered baseline labs. - Scheduled follow-up in 4 weeks. - Advised dietary sodium reduction. - Encouraged 150 minutes of walking per week. - CMP14+EGFR

## 2024-02-12 NOTE — Progress Notes (Signed)
 "  New Patient Office Visit  Subjective    Patient ID: Allison Prince, female    DOB: [birth year]  Age: 34 y.o. MRN: [id number]  Allison Prince presents to establish care.  Discussed the use of AI scribe software for clinical note transcription with the patient, who gave verbal consent to proceed.  History of Present Illness Allison Prince is a 34 year old female with hypertension who presents for establishment of care and management of elevated blood pressure.  She has a history of hypertension, which was managed with various medications during her pregnancy. Initially, she was on an unspecified medication, which was changed to labetalol  due to pregnancy. Postpartum, her blood pressure was elevated, and she was switched to nifedipine . She is currently out of nifedipine  and notes that her blood pressure remains elevated, with home readings around 185/96 mmHg.  She experiences occasional swelling in her right foot, which began after her first pregnancy, and weekly headaches, which she attributes to her high blood pressure. No blurred vision or history of stroke.  She smokes about half a pack of cigarettes daily. She engages in walking as a form of exercise.  Reports that she does have increased intake of sodium.     Past Medical History:  Diagnosis Date   Allergy    Fibroid    Hypertension    Vaginal Pap smear, abnormal     Past Surgical History:  Procedure Laterality Date   NO PAST SURGERIES     tooth pulled     TUBAL LIGATION Bilateral 07/12/2022   Procedure: POST PARTUM TUBAL LIGATION;  Surgeon: Eveline Lynwood MATSU, MD;  Location: [facility] LD ORS;  Service: Gynecology;  Laterality: Bilateral;    Family History  Problem Relation Age of Onset   COPD Mother    Depression Mother    Diabetes Mother    Hyperlipidemia Mother    Hypertension Mother    Asthma Brother    Arthritis Paternal Grandfather     Social History   Socioeconomic History   Marital status: Single     Spouse name: Not on file   Number of children: 1   Years of education: Not on file   Highest education level: Not on file  Occupational History   Not on file  Tobacco Use   Smoking status: Every Day    Current packs/day: 0.25    Average packs/day: 0.3 packs/day for 6.0 years (1.5 ttl pk-yrs)    Types: Cigarettes   Smokeless tobacco: Never   Tobacco comments:    1-2 a day  Vaping Use   Vaping status: Never Used  Substance and Sexual Activity   Alcohol use: Not Currently    Alcohol/week: 2.0 standard drinks of alcohol    Types: 2 Shots of liquor per week    Comment: occ   Drug use: No   Sexual activity: Yes    Birth control/protection: Surgical    Comment: tubal  Other Topics Concern   Not on file  Social History Narrative   Not on file   Social Drivers of Health   Tobacco Use: High Risk (08/15/2022)   Patient History    Smoking Tobacco Use: Every Day    Smokeless Tobacco Use: Never    Passive Exposure: Not on file  Financial Resource Strain: Low Risk (01/06/2022)   Overall Financial Resource Strain (CARDIA)    Difficulty of Paying Living Expenses: Not very hard  Food Insecurity: No Food Insecurity (07/11/2022)   Hunger Vital Sign  Worried About Programme Researcher, Broadcasting/film/video in the Last Year: Never true    Ran Out of Food in the Last Year: Never true  Transportation Needs: No Transportation Needs (07/11/2022)   PRAPARE - Administrator, Civil Service (Medical): No    Lack of Transportation (Non-Medical): No  Physical Activity: Insufficiently Active (01/06/2022)   Exercise Vital Sign    Days of Exercise per Week: 2 days    Minutes of Exercise per Session: 20 min  Stress: Stress Concern Present (01/06/2022)   [employer] of Occupational Health - Occupational Stress Questionnaire    Feeling of Stress : To some extent  Social Connections: Moderately Isolated (01/06/2022)   Social Connection and Isolation Panel    Frequency of Communication with Friends and  Family: More than three times a week    Frequency of Social Gatherings with Friends and Family: More than three times a week    Attends Religious Services: 1 to 4 times per year    Active Member of [employer] or Organizations: No    Attends Banker Meetings: Never    Marital Status: Never married  Intimate Partner Violence: Not At Risk (07/11/2022)   Humiliation, Afraid, Rape, and Kick questionnaire    Fear of Current or Ex-Partner: No    Emotionally Abused: No    Physically Abused: No    Sexually Abused: No  Depression (PHQ2-9): Low Risk (02/12/2024)   Depression (PHQ2-9)    PHQ-2 Score: 2  Alcohol Screen: Low Risk (01/06/2022)   Alcohol Screen    Last Alcohol Screening Score (AUDIT): 0  Housing: Low Risk (07/11/2022)   Housing    Last Housing Risk Score: 0  Utilities: Not At Risk (07/11/2022)   AHC Utilities    Threatened with loss of utilities: No  Health Literacy: Not on file    Review of Systems  Constitutional:  Negative for fatigue.  Eyes:  Negative for visual disturbance.  Respiratory:  Negative for cough, chest tightness, shortness of breath and wheezing.   Cardiovascular:  Negative for chest pain.  Neurological:  Positive for headaches. Negative for dizziness, syncope, facial asymmetry, speech difficulty and light-headedness.  Psychiatric/Behavioral:  Negative for sleep disturbance. The patient is not nervous/anxious.    Objective    Today's Vitals   02/12/24 1323 02/12/24 1330  BP: (!) 210/146 (!) 220/120  Pulse: 90   Temp: 97.8 F (36.6 C)   SpO2: 100%   Weight: 152 lb 4 oz (69.1 kg)   Height: 5' 2 (1.575 m)    Body mass index is 27.85 kg/m.   Physical Exam Vitals and nursing note reviewed.  Constitutional:      General: She is not in acute distress.    Appearance: Normal appearance. She is not ill-appearing.  Cardiovascular:     Rate and Rhythm: Normal rate and regular rhythm.     Heart sounds: Normal heart sounds, S1 normal and S2 normal. No  murmur heard. Pulmonary:     Effort: Pulmonary effort is normal. No respiratory distress.     Breath sounds: Normal breath sounds. No wheezing.  Musculoskeletal:     Right lower leg: No edema.     Left lower leg: No edema.  Neurological:     Mental Status: She is alert.  Psychiatric:        Mood and Affect: Mood normal.        Behavior: Behavior normal.        Thought Content: Thought content normal.  Judgment: Judgment normal.       02/12/2024    1:26 PM 01/06/2022    3:32 PM 11/22/2020    2:36 PM 03/15/2018   10:38 AM 11/10/2017    3:14 PM  Depression screen PHQ 2/9  Decreased Interest 0 1 0 0 0  Down, Depressed, Hopeless 0 0 0 0 0  PHQ - 2 Score 0 1 0 0 0  Altered sleeping 1 2     Tired, decreased energy 1 2     Change in appetite 0 2     Feeling bad or failure about yourself  0 0     Trouble concentrating 0 0     Moving slowly or fidgety/restless 0 0     Suicidal thoughts 0 0     PHQ-9 Score 2 7      Difficult doing work/chores Not difficult at all         Data saved with a previous flowsheet row definition      02/12/2024    1:26 PM 01/06/2022    3:32 PM  GAD 7 : Generalized Anxiety Score  Nervous, Anxious, on Edge 0 0  Control/stop worrying 0 1  Worry too much - different things 1 1  Trouble relaxing 0 0  Restless 0 0  Easily annoyed or irritable 1 2  Afraid - awful might happen 0 0  Total GAD 7 Score 2 4  Anxiety Difficulty Not difficult at all    Assessment & Plan:  1. Primary hypertension (Primary) -Hypertension uncontrolled with nifedipine . Home systolic readings ~185 mmHg, current 210/146 mmHg. Weekly headaches likely due to hypertension. Smoking may contribute. - Discontinued nifedipine . - Initiated amlodipine .  - Advised daily home blood pressure monitoring. Gave target blood pressure of less than 130/80.  - Ordered baseline labs. - Scheduled follow-up in 4 weeks. - Advised dietary sodium reduction. - Encouraged 150 minutes of walking per  week. - CMP14+EGFR - amLODipine  (NORVASC ) 5 MG tablet; Take 1 tablet (5 mg total) by mouth daily.  Dispense: 30 tablet; Refill: 0  2. Screening for lipid disorders - Lipid panel  3. Tobacco abuse disorder - Continues smoking half a pack daily. Smoking cessation advised to aid blood pressure control. Spent 5 minutes counseling for smoking cession. Discussed Chantix  for craving reduction. - Initiated Chantix , titrated dose as tolerated. - Varenicline  Tartrate, Starter, (CHANTIX  STARTING MONTH PAK) 0.5 MG X 11 & 1 MG X 42 TBPK; Take 0.5 mg by mouth daily for 11 days, THEN 1 mg daily.  Dispense: 53 each; Refill: 0   Return in about 2 weeks (around 02/26/2024).   Damien KATHEE Pringle, FNP   "

## 2024-02-12 NOTE — Progress Notes (Signed)
" ° °  Subjective:    Patient ID: Allison Prince, female    DOB: 1990/07/22, 34 y.o.   MRN: [id number]  HPI    Review of Systems     Objective:   Physical Exam        Assessment & Plan:    "

## 2024-03-01 ENCOUNTER — Ambulatory Visit

## 2024-03-04 ENCOUNTER — Ambulatory Visit

## 2024-03-04 VITALS — BP 170/105 | HR 77 | Temp 98.2°F | Ht 62.0 in | Wt 153.2 lb

## 2024-03-04 DIAGNOSIS — I10 Essential (primary) hypertension: Secondary | ICD-10-CM

## 2024-03-04 MED ORDER — HYDROCHLOROTHIAZIDE 12.5 MG PO TABS: 12.5000 mg | ORAL_TABLET | Freq: Every day | ORAL | 1 refills | Status: AC

## 2024-03-04 MED ORDER — AMLODIPINE BESYLATE 5 MG PO TABS: 5.0000 mg | ORAL_TABLET | Freq: Every day | ORAL | 2 refills | Status: AC

## 2024-03-04 NOTE — Progress Notes (Cosign Needed)
 "  Acute Office Visit  Subjective:     Patient ID: Allison Prince, female    DOB: Dec 28, 1990, 34 y.o.   MRN: [id number]   HPI Discussed the use of AI scribe software for clinical note transcription with the patient, who gave verbal consent to proceed.  History of Present Illness Allison Prince is a 34 year old female with hypertension who presents for blood pressure management.  Her blood pressure has decreased since her last visit. She monitors her blood pressure at work after taking her medication, noting it is still in the 170's systolic. She experiences headaches that have improved but are still present.  She is currently taking amlodipine , though the dosage was not specified. She recalls being on both amlodipine  and hydrochlorothiazide  before her pregnancy with her seven-year-old child, but was switched to safer medications during pregnancy.  She reports occasional swelling in her right foot, which is not constant.   Review of Systems  Constitutional:  Negative for activity change, appetite change and fatigue.  Eyes:  Negative for visual disturbance.  Respiratory:  Negative for cough, chest tightness, shortness of breath and wheezing.   Cardiovascular:  Positive for leg swelling. Negative for chest pain and palpitations.  Neurological:  Positive for headaches.       Objective:   Today's Vitals   03/04/24 1550 03/04/24 1608  BP: (!) 178/111 (!) 170/105  Pulse: 77   Temp: 98.2 F (36.8 C)   SpO2: 100%   Weight: 153 lb 3.2 oz (69.5 kg)   Height: 5' 2 (1.575 m)    Body mass index is 28.02 kg/m.   Physical Exam Vitals and nursing note reviewed.  Constitutional:      General: She is not in acute distress.    Appearance: Normal appearance. She is not ill-appearing.  Cardiovascular:     Rate and Rhythm: Normal rate and regular rhythm.     Heart sounds: Normal heart sounds, S1 normal and S2 normal. No murmur heard. Pulmonary:     Effort: Pulmonary effort is  normal. No respiratory distress.     Breath sounds: Normal breath sounds. No wheezing.  Musculoskeletal:     Right lower leg: No edema.     Left lower leg: No edema.  Neurological:     Mental Status: She is alert.  Psychiatric:        Mood and Affect: Mood normal.        Behavior: Behavior normal.        Thought Content: Thought content normal.        Judgment: Judgment normal.    No results found for any visits on 03/04/24.    Assessment & Plan:  1. Chronic hypertension (Primary) - Blood pressure remains elevated in the 170s despite amlodipine  5 mg daily. Previous combination therapy with amlodipine  and hydrochlorothiazide  was effective before pregnancy. Goal: BP <140/90 mmHg. - Added hydrochlorothiazide  to regimen. - Continue amlodipine  5 mg daily. - Monitor BP at home, report readings over four weeks. - Consider increasing amlodipine  if BP remains elevated after hydrochlorothiazide . - Follow-up in four weeks. - hydrochlorothiazide  (HYDRODIURIL ) 12.5 MG tablet; Take 1 tablet (12.5 mg total) by mouth daily.  Dispense: 30 tablet; Refill: 1 - amLODipine  (NORVASC ) 5 MG tablet; Take 1 tablet (5 mg total) by mouth daily.  Dispense: 30 tablet; Refill: 2  Return in about 4 weeks (around 04/01/2024).  Damien KATHEE Pringle, FNP  Note:  This document was prepared using Dragon voice recognition software and may include  unintentional dictation errors.   "

## 2024-04-04 ENCOUNTER — Ambulatory Visit
